# Patient Record
Sex: Male | Born: 1965
Health system: Southern US, Community
[De-identification: ages and names within clinical notes are randomized; demographics above are authoritative.]

## PROBLEM LIST (undated history)

## (undated) DIAGNOSIS — Z8489 Family history of other specified conditions: Secondary | ICD-10-CM

## (undated) DIAGNOSIS — I10 Essential (primary) hypertension: Secondary | ICD-10-CM

## (undated) DIAGNOSIS — G4730 Sleep apnea, unspecified: Secondary | ICD-10-CM

## (undated) DIAGNOSIS — R519 Headache, unspecified: Secondary | ICD-10-CM

## (undated) DIAGNOSIS — Z201 Contact with and (suspected) exposure to tuberculosis: Secondary | ICD-10-CM

## (undated) DIAGNOSIS — E119 Type 2 diabetes mellitus without complications: Secondary | ICD-10-CM

## (undated) DIAGNOSIS — B20 Human immunodeficiency virus [HIV] disease: Secondary | ICD-10-CM

## (undated) DIAGNOSIS — R51 Headache: Secondary | ICD-10-CM

## (undated) DIAGNOSIS — A6000 Herpesviral infection of urogenital system, unspecified: Secondary | ICD-10-CM

## (undated) DIAGNOSIS — K219 Gastro-esophageal reflux disease without esophagitis: Secondary | ICD-10-CM

## (undated) DIAGNOSIS — I2510 Atherosclerotic heart disease of native coronary artery without angina pectoris: Secondary | ICD-10-CM

## (undated) DIAGNOSIS — J45909 Unspecified asthma, uncomplicated: Secondary | ICD-10-CM

## (undated) DIAGNOSIS — M1990 Unspecified osteoarthritis, unspecified site: Secondary | ICD-10-CM

## (undated) DIAGNOSIS — Z21 Asymptomatic human immunodeficiency virus [HIV] infection status: Secondary | ICD-10-CM

## (undated) DIAGNOSIS — E669 Obesity, unspecified: Secondary | ICD-10-CM

## (undated) DIAGNOSIS — N179 Acute kidney failure, unspecified: Secondary | ICD-10-CM

## (undated) HISTORY — DX: Asymptomatic human immunodeficiency virus (hiv) infection status: Z21

## (undated) HISTORY — DX: Herpesviral infection of urogenital system, unspecified: A60.00

## (undated) HISTORY — DX: Human immunodeficiency virus (HIV) disease: B20

## (undated) HISTORY — DX: Atherosclerotic heart disease of native coronary artery without angina pectoris: I25.10

## (undated) HISTORY — PX: TONSILLECTOMY: SUR1361

## (undated) HISTORY — PX: HERNIA REPAIR: SHX51

## (undated) HISTORY — PX: APPENDECTOMY: SHX54

## (undated) HISTORY — PX: COLONOSCOPY W/ BIOPSIES AND POLYPECTOMY: SHX1376

---

## 2007-05-21 HISTORY — PX: UVULECTOMY: SHX2631

## 2007-05-21 HISTORY — PX: PEG TUBE PLACEMENT: SUR1034

## 2007-07-19 LAB — CONVERTED CEMR LAB
CD4 Count: 1 microliters
HIV 1 RNA Quant: 515000 copies/mL

## 2007-10-23 LAB — CONVERTED CEMR LAB
CD4 Count: 1 microliters
CD4 Count: 3 microliters
CD4 T Helper %: 1 %
HIV 1 RNA Quant: 179347 copies/mL
HIV 1 RNA Quant: 515000 copies/mL

## 2008-10-06 LAB — CONVERTED CEMR LAB
CD4 Count: 288 microliters
CD4 T Helper %: 15 %
HIV 1 RNA Quant: <50 copies/mL

## 2009-01-09 ENCOUNTER — Emergency Department (HOSPITAL_COMMUNITY): Admission: EM | Admit: 2009-01-09 | Discharge: 2009-01-09 | Payer: Self-pay | Admitting: Emergency Medicine

## 2009-07-30 ENCOUNTER — Emergency Department (HOSPITAL_COMMUNITY): Admission: EM | Admit: 2009-07-30 | Discharge: 2009-07-30 | Payer: Self-pay | Admitting: Emergency Medicine

## 2009-10-12 DIAGNOSIS — B20 Human immunodeficiency virus [HIV] disease: Secondary | ICD-10-CM | POA: Insufficient documentation

## 2009-10-18 ENCOUNTER — Ambulatory Visit: Payer: Self-pay | Admitting: Internal Medicine

## 2009-10-18 LAB — CONVERTED CEMR LAB
ALT: 14 units/L (ref 0–53)
AST: 18 units/L (ref 0–37)
Albumin: 3.8 g/dL (ref 3.5–5.2)
Alkaline Phosphatase: 118 units/L — ABNORMAL HIGH (ref 39–117)
BUN: 13 mg/dL (ref 6–23)
Basophils Absolute: 0 10*3/uL (ref 0.0–0.1)
Basophils Relative: 0 % (ref 0–1)
Bilirubin Urine: NEGATIVE
CO2: 21 meq/L (ref 19–32)
Calcium: 9 mg/dL (ref 8.4–10.5)
Chlamydia, Swab/Urine, PCR: NEGATIVE
Chloride: 106 meq/L (ref 96–112)
Cholesterol: 179 mg/dL (ref 0–200)
Creatinine, Ser: 1.07 mg/dL (ref 0.40–1.50)
Eosinophils Absolute: 0.2 10*3/uL (ref 0.0–0.7)
Eosinophils Relative: 3 % (ref 0–5)
GC Probe Amp, Urine: NEGATIVE
Glucose, Bld: 92 mg/dL (ref 70–99)
HCT: 37.5 % — ABNORMAL LOW (ref 39.0–52.0)
HCV Ab: NEGATIVE
HDL: 38 mg/dL — ABNORMAL LOW (ref >39)
HIV 1 RNA Quant: 51 copies/mL — ABNORMAL HIGH (ref <48)
HIV-1 RNA Quant, Log: 1.71 — ABNORMAL HIGH (ref <1.68)
HIV-1 antibody: POSITIVE — AB
HIV-2 Ab: NEGATIVE
HIV: REACTIVE
Hemoglobin, Urine: NEGATIVE
Hemoglobin: 12.4 g/dL — ABNORMAL LOW (ref 13.0–17.0)
Hep A Total Ab: POSITIVE — AB
Hep B Core Total Ab: NEGATIVE
Hep B S Ab: POSITIVE — AB
Hepatitis B Surface Ag: NEGATIVE
Ketones, ur: NEGATIVE mg/dL
LDL Cholesterol: 120 mg/dL — ABNORMAL HIGH (ref 0–99)
Leukocytes, UA: NEGATIVE
Lymphocytes Relative: 44 % (ref 12–46)
Lymphs Abs: 3.4 10*3/uL (ref 0.7–4.0)
MCHC: 33.1 g/dL (ref 30.0–36.0)
MCV: 93.1 fL (ref 78.0–100.0)
Monocytes Absolute: 0.5 10*3/uL (ref 0.1–1.0)
Monocytes Relative: 7 % (ref 3–12)
Neutro Abs: 3.6 10*3/uL (ref 1.7–7.7)
Neutrophils Relative %: 46 % (ref 43–77)
Nitrite: NEGATIVE
Platelets: 279 10*3/uL (ref 150–400)
Potassium: 4.2 meq/L (ref 3.5–5.3)
Protein, ur: NEGATIVE mg/dL
RBC: 4.03 M/uL — ABNORMAL LOW (ref 4.22–5.81)
RDW: 13.6 % (ref 11.5–15.5)
Sodium: 139 meq/L (ref 135–145)
Specific Gravity, Urine: 1.022 (ref 1.005–1.030)
Total Bilirubin: 0.6 mg/dL (ref 0.3–1.2)
Total CHOL/HDL Ratio: 4.7
Total Protein: 7.3 g/dL (ref 6.0–8.3)
Triglycerides: 104 mg/dL (ref <150)
Urine Glucose: NEGATIVE mg/dL
Urobilinogen, UA: 0.2 (ref 0.0–1.0)
VLDL: 21 mg/dL (ref 0–40)
WBC: 7.7 10*3/uL (ref 4.0–10.5)
pH: 6 (ref 5.0–8.0)

## 2009-11-01 ENCOUNTER — Ambulatory Visit: Payer: Self-pay | Admitting: Internal Medicine

## 2009-11-01 DIAGNOSIS — B009 Herpesviral infection, unspecified: Secondary | ICD-10-CM | POA: Insufficient documentation

## 2009-11-01 DIAGNOSIS — B029 Zoster without complications: Secondary | ICD-10-CM | POA: Insufficient documentation

## 2009-11-01 DIAGNOSIS — Z9089 Acquired absence of other organs: Secondary | ICD-10-CM | POA: Insufficient documentation

## 2009-11-01 DIAGNOSIS — B370 Candidal stomatitis: Secondary | ICD-10-CM | POA: Insufficient documentation

## 2009-11-02 DIAGNOSIS — G609 Hereditary and idiopathic neuropathy, unspecified: Secondary | ICD-10-CM | POA: Insufficient documentation

## 2009-11-02 DIAGNOSIS — B59 Pneumocystosis: Secondary | ICD-10-CM | POA: Insufficient documentation

## 2010-01-10 ENCOUNTER — Telehealth: Payer: Self-pay | Admitting: Internal Medicine

## 2010-02-08 ENCOUNTER — Encounter (INDEPENDENT_AMBULATORY_CARE_PROVIDER_SITE_OTHER): Payer: Self-pay | Admitting: *Deleted

## 2010-06-19 NOTE — Miscellaneous (Signed)
Summary: New 042 orders update  Clinical Lists Changes  Problems: Added new problem of HIV INFECTION (ICD-042) Orders: Added new Test order of T-Comprehensive Metabolic Panel [phone]) - Signed Added new Test order of T-CBC w/Diff [phone]) - Signed Added new Test order of T-CD4SP [facility]) (CD4SP) - Signed Added new Test order of T-HIV Viral Load [phone]) - Signed Added new Test order of T-Chlamydia  Probe, urine [phone]) - Signed Added new Test order of T-GC Probe, urine [phone]) - Signed Added new Test order of T-Hepatitis B Surface Antigen [phone]) - Signed Added new Test order of T-Hepatitis B Surface Antibody [phone]) - Signed Added new Test order of T-Hepatitis B Core Antibody ([id number]) - Signed Added new Test order of T-Hepatitis C Antibody ([id number]) - Signed Added new Test order of T-HIV Antibody  (Reflex) ([id number]) - Signed Added new Test order of T-HIV Ab Confirmatory Test/Western Blot ([id number]) - Signed Added new Test order of T-Lipid Profile ([id number]) - Signed Added new Test order of T-RPR (Syphilis) ([id number]) - Signed Added new Test order of T-Urinalysis ([id number]) - Signed Added new Test order of T-HIV1 Quant rflx Ultra or Genotype ([id number]) - Signed Added new Test order of T-Hepatitis A Antibody ([id number]) - Signed Allergies: Added new allergy or adverse reaction of PCN Observations: Added new observation of PCP_PMH_YEAR: 2003  (10/12/2009 15:12) Added new observation of NKA: F  (10/12/2009 15:12) Added new observation of HIV INIT DX: 1995  (10/12/2009 15:12) Added new observation of GENDER: Male  (10/12/2009 15:12) Added new observation of RACE: African American  (10/12/2009 15:12) Added new observation of HEPBVAX#3: Historical  (10/06/2008 15:25) Added new observation of PNEUMOVAX: Historical  (10/06/2008 15:24) Added new observation of TD BOOSTER: Historical  (10/06/2008 15:24) Added new  observation of T-HELPER %: 15 % (10/06/2008 15:20) Added new observation of HIV1RNA QA: <50 copies/mL (10/06/2008 15:19) Added new observation of CD4 COUNT: 288 microliters (10/06/2008 15:19) Added new observation of HEPBVAX#2: Historical  (08/31/2008 15:25) Added new observation of HEPAVAX #1: Historical  (08/31/2008 15:24) Added new observation of HEPBVAX#1: Historical  (08/31/2008 15:24) Added new observation of FLU VAX: Historical  (03/10/2008 15:24) Added new observation of T-HELPER %: 1 % (10/23/2007 15:21) Added new observation of HIV1RNA QA: 195093 copies/mL (10/23/2007 15:18) Added new observation of CD4 COUNT: 3 microliters (10/23/2007 15:17) Added new observation of HIV1RNA QA: 515000 copies/mL (10/23/2007 15:17) Added new observation of CD4 COUNT: 1 microliters (10/23/2007 15:17) Added new observation of HIV1RNA QA: 515000 copies/mL (07/19/2007 15:18) Added new observation of CD4 COUNT: 1 microliters (07/19/2007 15:18)         -  Date:  10/06/2008    CD4: 288    Viral Load: <50    CD4%: 15  Date:  10/23/2007    CD4: 3    CD4: 1    Viral Load: [id number]    Viral Load: 515000    CD4%: 1  Date:  07/19/2007    CD4: 1    Viral Load: 515000    Immunization History:  Hepatitis B Immunization History:    Hepatitis B # 1:  historical (08/31/2008)    Hepatitis B # 2:  historical (08/31/2008)    Hepatitis B # 3:  historical (10/06/2008)  Tetanus/Td Immunization History:    Tetanus/Td:  historical (10/06/2008)  Influenza Immunization History:    Influenza:  historical (03/10/2008)  Pneumovax Immunization History:    Pneumovax:  historical (10/06/2008)  Hepatitis A Immunization History:    Hepatitis A # 1:  historical (08/31/2008)  Medical History  HIV Intake Information When did you first test positive for HIV? 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

## 2010-06-19 NOTE — Assessment & Plan Note (Signed)
Summary: new 042 pt labs 6/1   CC:  new patient to establish and lab results.  History of Present Illness: This is the first ID clinic visit for [facility]  He moved here from [place] 11/10 to be with his partner. He is part of a [employer] study which is how he gets his medications.  He was diagnosed HIV in 1995.  Intial CD4ct <10.  Has K103N, M184V, L74I, K219Q and D67N mutations on previous genotypes.  he is currently on Reyetaz,norvir, Truvada and Isentress.  Current partner is HIV (+) and they recently had a baby.  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  No new complaints.  Preventive Screening-Counseling & Management  Alcohol-Tobacco     Alcohol drinks/day: occasional     Alcohol type: beer and wine     Smoking Status: quit > 6 months     Packs/Day: <0.25     Year Started: 1975  Caffeine-Diet-Exercise     Caffeine use/day: soda 2 per day     Does Patient Exercise: yes     Type of exercise: walking     Exercise (avg: min/session): 30-60     Times/week: 4  Safety-Violence-Falls     Seat Belt Use: yes      Sexual History:  currently monogamous.        Drug Use:  former and marijuana.    Comments: pt. declined condoms   Updated Prior Medication List: REYATAZ 300 MG CAPS (ATAZANAVIR SULFATE) Take 1 tablet by mouth once a day NORVIR 100 MG TABS (RITONAVIR) Take 1 tablet by mouth once a day TRUVADA 200-300 MG TABS (EMTRICITABINE-TENOFOVIR) Take 1 tablet by mouth once a day ISENTRESS 400 MG TABS (RALTEGRAVIR POTASSIUM) Take 1 tablet by mouth two times a day VALTREX 500 MG TABS (VALACYCLOVIR HCL) Take 1 tablet by mouth once a day  Current Allergies (reviewed today): ! PCN Social History: Sexual History:  currently monogamous Drug Use:  former, marijuana  Review of Systems  The patient denies anorexia, fever, and weight loss.    Vital Signs:  Patient profile:   45 year old male Height:      73 inches (185.42 cm) Weight:      344.4 pounds (156.55 kg) BMI:     45.60 Temp:     98.7 degrees F  (37.06 degrees C) oral Pulse rate:   87 / minute BP sitting:   131 / 85  (right arm)  Vitals Entered By: Wendall Mola CMA Duncan Dull) (November 01, 2009 2:21 PM) CC: new patient to establish, lab results Is Patient Diabetic? No Pain Assessment Patient in pain? no      Nutritional Status BMI of > 30 = obese Nutritional Status Detail appetite "fine"  Have you ever been in a relationship where you felt threatened, hurt or afraid?No   Does patient need assistance? Functional Status Self care Ambulation Normal Comments pt. missed about three doses of meds in the last month   Physical Exam  General:  alert, well-developed, well-nourished, and well-hydrated.   Head:  normocephalic and atraumatic.   Mouth:  pharynx pink and moist.   Lungs:  normal breath sounds.   Heart:  normal rate and regular rhythm.          Medication Adherence: 11/01/2009   Adherence to medications reviewed with patient. Counseling to provide adequate adherence provided   Prevention For Positives: 11/01/2009   Safe sex practices discussed with patient. Condoms offered.  Impression & Recommendations:  Problem # 1:  HIV INFECTION (ICD-042) Pt.s most recent CD4ct was 591 and VL 51 .  Pt instructed to continue the current antiretroviral regimen.  Pt encouraged to take medication regularly and not miss doses.  Pt will f/u in 3 months for repeat blood work and will see me 2 weeks later.  Diagnostics Reviewed:  HIV: REACTIVE (10/18/2009)   HIV-Western blot: Positive (10/18/2009)   CD4: 590 (10/19/2009)   WBC: 7.7 (10/18/2009)   Hgb: 12.4 (10/18/2009)   HCT: 37.5 (10/18/2009)   Platelets: 279 (10/18/2009) HIV-1 RNA: 51 (10/18/2009)   HBSAg: NEG (10/18/2009)  Orders: New Patient Level III (99203)Future Orders: T-CD4SP ([facility]) (CD4SP) ... 01/30/2010 T-HIV Viral Load [phone]) ... 01/30/2010 T-Comprehensive Metabolic Panel [id number]) ... 01/30/2010 T-CBC w/Diff  ([id number]) ... 01/30/2010  Medications Added to Medication List This Visit: 1)  Reyataz 300 Mg Caps (Atazanavir sulfate) .... Take 1 tablet by mouth once a day 2)  Norvir 100 Mg Tabs (Ritonavir) .... Take 1 tablet by mouth once a day 3)  Truvada 200-300 Mg Tabs (Emtricitabine-tenofovir) .... Take 1 tablet by mouth once a day 4)  Isentress 400 Mg Tabs (Raltegravir potassium) .... Take 1 tablet by mouth two times a day 5)  Valtrex 500 Mg Tabs (Valacyclovir hcl) .... Take 1 tablet by mouth once a day  Patient Instructions: 1)  Please schedule a follow-up appointment in 3 months, 2 weeks after labs.

## 2010-06-19 NOTE — Progress Notes (Signed)
Summary: Refill Valtrex  Phone Note Call from Patient   Caller: Patient Summary of Call: Pt. requesting a refill on Valtrex called to [facility] at [address]. Initial call taken by: Wendall Mola CMA Duncan Dull),  January 10, 2010 8:42 AM

## 2010-06-19 NOTE — Consult Note (Signed)
Summary: New Pt. : Vincent Young. Health Dept.  New Pt. : Vincent Young. Health Dept.   Imported By: Florinda Marker 11/06/2009 16:31:47  _____________________________________________________________________  External Attachment:    Type:   Image     Comment:   External Document

## 2010-06-19 NOTE — Miscellaneous (Signed)
Summary: RW update  Clinical Lists Changes  Observations: Added new observation of RWPARTICIP: Yes (02/08/2010 9:26)

## 2010-06-19 NOTE — Miscellaneous (Signed)
Summary: HIPAA Restrictions  HIPAA Restrictions   Imported By: Florinda Marker 10/19/2009 15:11:18  _____________________________________________________________________  External Attachment:    Type:   Image     Comment:   External Document

## 2010-06-28 ENCOUNTER — Encounter (INDEPENDENT_AMBULATORY_CARE_PROVIDER_SITE_OTHER): Payer: Self-pay | Admitting: *Deleted

## 2010-07-02 ENCOUNTER — Encounter (INDEPENDENT_AMBULATORY_CARE_PROVIDER_SITE_OTHER): Payer: Self-pay | Admitting: *Deleted

## 2010-07-05 NOTE — Miscellaneous (Signed)
  Clinical Lists Changes  Observations: Added new observation of PAYOR: No Insurance (06/28/2010 14:02) Added new observation of LATINO/HISP: No (06/28/2010 14:02)

## 2010-07-10 ENCOUNTER — Encounter (INDEPENDENT_AMBULATORY_CARE_PROVIDER_SITE_OTHER): Payer: Self-pay | Admitting: *Deleted

## 2010-07-11 ENCOUNTER — Encounter (INDEPENDENT_AMBULATORY_CARE_PROVIDER_SITE_OTHER): Payer: Self-pay | Admitting: *Deleted

## 2010-07-11 NOTE — Miscellaneous (Signed)
  Clinical Lists Changes  Observations: Added new observation of [place]: [place] (07/02/2010 16:17)

## 2010-07-12 ENCOUNTER — Encounter (INDEPENDENT_AMBULATORY_CARE_PROVIDER_SITE_OTHER): Payer: Self-pay | Admitting: *Deleted

## 2010-07-17 NOTE — Miscellaneous (Signed)
  Clinical Lists Changes  Observations: Added new observation of HOUSING: stable/permanent (05/08/2010 10:13)

## 2010-07-17 NOTE — Miscellaneous (Signed)
  Clinical Lists Changes  Observations: Added new observation of YEARAIDSPOS: 1997  (07/10/2010 11:57)

## 2010-07-17 NOTE — Miscellaneous (Signed)
  Clinical Lists Changes  Observations: Added new observation of HOUSING: Stable/permanent (07/11/2010 17:22)

## 2010-07-17 NOTE — Miscellaneous (Signed)
  Clinical Lists Changes  Observations: Added new observation of HIV RISK BEH: MSM (07/11/2010 16:54)

## 2010-08-06 ENCOUNTER — Telehealth: Payer: Self-pay | Admitting: *Deleted

## 2010-08-06 LAB — T-HELPER CELL (CD4) - (RCID CLINIC ONLY)
CD4 % Helper T Cell: 19 % — ABNORMAL LOW (ref 33–55)
CD4 T Cell Abs: 590 uL (ref 400–2700)

## 2010-08-16 NOTE — Progress Notes (Signed)
  Phone Note Call from Patient   Summary of Call: he had left message staing he thinks he is due for an appt. I called him back & LM giving the front desk #. asked that he call them & make an appt Initial call taken by: Golden Circle RN,  August 06, 2010 3:29 PM

## 2010-09-12 ENCOUNTER — Other Ambulatory Visit: Payer: Self-pay | Admitting: *Deleted

## 2010-09-12 DIAGNOSIS — Z0000 Encounter for general adult medical examination without abnormal findings: Secondary | ICD-10-CM

## 2010-09-12 DIAGNOSIS — B20 Human immunodeficiency virus [HIV] disease: Secondary | ICD-10-CM

## 2010-09-13 ENCOUNTER — Other Ambulatory Visit (INDEPENDENT_AMBULATORY_CARE_PROVIDER_SITE_OTHER): Payer: BLUE CROSS/BLUE SHIELD

## 2010-09-13 ENCOUNTER — Other Ambulatory Visit: Payer: Self-pay | Admitting: Infectious Diseases

## 2010-09-13 DIAGNOSIS — B20 Human immunodeficiency virus [HIV] disease: Secondary | ICD-10-CM

## 2010-09-13 DIAGNOSIS — Z0000 Encounter for general adult medical examination without abnormal findings: Secondary | ICD-10-CM

## 2010-09-14 LAB — CBC
HCT: 40.5 % (ref 39.0–52.0)
Hemoglobin: 13.4 g/dL (ref 13.0–17.0)
MCH: 31.3 pg (ref 26.0–34.0)
MCHC: 33.1 g/dL (ref 30.0–36.0)
MCV: 94.6 fL (ref 78.0–100.0)
Platelets: 286 10*3/uL (ref 150–400)
RBC: 4.28 MIL/uL (ref 4.22–5.81)
RDW: 13.9 % (ref 11.5–15.5)
WBC: 7.9 10*3/uL (ref 4.0–10.5)

## 2010-09-14 LAB — COMPREHENSIVE METABOLIC PANEL
ALT: 14 U/L (ref 0–53)
AST: 14 U/L (ref 0–37)
Albumin: 4 g/dL (ref 3.5–5.2)
Alkaline Phosphatase: 123 U/L — ABNORMAL HIGH (ref 39–117)
BUN: 13 mg/dL (ref 6–23)
CO2: 22 mEq/L (ref 19–32)
Calcium: 8.8 mg/dL (ref 8.4–10.5)
Chloride: 106 mEq/L (ref 96–112)
Creat: 0.91 mg/dL (ref 0.40–1.50)
Glucose, Bld: 111 mg/dL — ABNORMAL HIGH (ref 70–99)
Potassium: 4.2 mEq/L (ref 3.5–5.3)
Sodium: 140 mEq/L (ref 135–145)
Total Bilirubin: 2.1 mg/dL — ABNORMAL HIGH (ref 0.3–1.2)
Total Protein: 7.1 g/dL (ref 6.0–8.3)

## 2010-09-14 LAB — DIFFERENTIAL
Basophils Absolute: 0 10*3/uL (ref 0.0–0.1)
Basophils Relative: 0 % (ref 0–1)
Eosinophils Absolute: 0.2 10*3/uL (ref 0.0–0.7)
Eosinophils Relative: 2 % (ref 0–5)
Lymphocytes Relative: 38 % (ref 12–46)
Lymphs Abs: 2.8 10*3/uL (ref 0.7–4.0)
Monocytes Absolute: 0.5 10*3/uL (ref 0.1–1.0)
Monocytes Relative: 7 % (ref 3–12)
Neutro Abs: 3.9 10*3/uL (ref 1.7–7.7)
Neutrophils Relative %: 53 % (ref 43–77)

## 2010-09-14 LAB — T-HELPER CELL (CD4) - (RCID CLINIC ONLY)
CD4 % Helper T Cell: 19 % — ABNORMAL LOW (ref 33–55)
CD4 T Cell Abs: 570 uL (ref 400–2700)

## 2010-09-15 LAB — HIV-1 RNA QUANT-NO REFLEX-BLD
HIV 1 RNA Quant: <20 copies/mL (ref <20)
HIV-1 RNA Quant, Log: <1.3 {Log} (ref <1.30)

## 2010-09-27 ENCOUNTER — Encounter: Payer: Self-pay | Admitting: Adult Health

## 2010-09-27 ENCOUNTER — Ambulatory Visit (INDEPENDENT_AMBULATORY_CARE_PROVIDER_SITE_OTHER): Payer: BLUE CROSS/BLUE SHIELD | Admitting: Adult Health

## 2010-09-27 DIAGNOSIS — B009 Herpesviral infection, unspecified: Secondary | ICD-10-CM

## 2010-09-27 DIAGNOSIS — B20 Human immunodeficiency virus [HIV] disease: Secondary | ICD-10-CM

## 2010-09-27 DIAGNOSIS — Z79899 Other long term (current) drug therapy: Secondary | ICD-10-CM

## 2010-09-27 NOTE — Progress Notes (Signed)
Subjective:    Patient ID: Vincent Young, male    DOB: [birth year], 45 y.o.   MRN: [id number]  HPI not seen in clinic since June of 2011. Claims he has been adherent to his current regimen, which includes Reyataz, Norvir Truvada, Isentress. Claims good tolerance to this regimen, with only one missed dose in the past 6 months. He voices no physical complaints except having gained significant amount of weight over the past year. He does relate recent recurrent outbreaks of genital HSV for which previously. He has been on Valtrex suppressive therapy, but he ran out of the medication and did not have any refills for 3 weeks. Outbreak subsided after he resumed his Valtrex suppressive therapy. Denies any active lesions at present. He is currently on a weight reduction. Plan and has lost 5-10 pounds.    Review of Systems  Constitutional: Negative for fever, chills, diaphoresis, activity change, appetite change, fatigue and unexpected weight change.  HENT: Negative for hearing loss, ear pain, nosebleeds, congestion, sore throat, facial swelling, rhinorrhea, sneezing, drooling, mouth sores, trouble swallowing, neck pain, neck stiffness, dental problem, voice change, postnasal drip, sinus pressure, tinnitus and ear discharge.   Eyes: Negative for photophobia, pain, discharge, redness, itching and visual disturbance.  Respiratory: Negative for apnea, cough, choking, chest tightness, shortness of breath, wheezing and stridor.   Cardiovascular: Negative for chest pain, palpitations and leg swelling.  Gastrointestinal: Negative for nausea, vomiting, abdominal pain, diarrhea, constipation, blood in stool, abdominal distention, anal bleeding and rectal pain.  Genitourinary: Negative for dysuria, urgency, frequency, hematuria, flank pain, decreased urine volume, discharge, penile swelling, scrotal swelling, enuresis, difficulty urinating, genital sores, penile pain and testicular pain.  Musculoskeletal: Negative for  myalgias, back pain, joint swelling, arthralgias and gait problem.  Skin: Negative for color change, pallor, rash and wound.  Neurological: Negative for dizziness, tremors, seizures, syncope, facial asymmetry, speech difficulty, weakness, light-headedness, numbness and headaches.  Hematological: Negative for adenopathy. Does not bruise/bleed easily.  Psychiatric/Behavioral: Negative for suicidal ideas, hallucinations, behavioral problems, confusion, sleep disturbance, self-injury, dysphoric mood, decreased concentration and agitation. The patient is not nervous/anxious and is not hyperactive.        Objective:   Physical Exam  Constitutional: He is oriented to person, place, and time. He appears well-developed. No distress.       Morbidly obese  HENT:  Head: Normocephalic and atraumatic.  Right Ear: External ear normal.  Left Ear: External ear normal.  Nose: Nose normal.  Mouth/Throat: Oropharynx is clear and moist. No oropharyngeal exudate.  Eyes: Conjunctivae and EOM are normal. Pupils are equal, round, and reactive to light. Right eye exhibits no discharge. Left eye exhibits no discharge. No scleral icterus.  Neck: Normal range of motion. Neck supple. No JVD present. No tracheal deviation present. No thyromegaly present.  Cardiovascular: Normal rate, regular rhythm and intact distal pulses.  Exam reveals no gallop and no friction rub.   No murmur heard. Pulmonary/Chest: Effort normal and breath sounds normal. No stridor. No respiratory distress. He has no wheezes. He has no rales. He exhibits no tenderness.  Abdominal: Soft. Bowel sounds are normal. He exhibits no distension and no mass. There is no tenderness. There is no rebound and no guarding.  Genitourinary: Rectum normal and penis normal.  Musculoskeletal: Normal range of motion. He exhibits no edema and no tenderness.  Lymphadenopathy:    He has no cervical adenopathy.  Neurological: He is alert and oriented to person, place,  and time. No cranial nerve deficit. He exhibits  normal muscle tone. Coordination normal.  Skin: Skin is warm and dry. No rash noted. No erythema. No pallor.  Psychiatric: He has a normal mood and affect. His behavior is normal. Judgment and thought content normal.          Assessment & Plan:  1. HIV. From labs obtained on 09/13/2010 CD4 count was 570 at 19% with a viral load of less than 20 copies per mL. This is consistent with his labs that were obtained in June 2011, showing no changes in either percentage or absolute CD4 count. He remains clinically stable on his current regimen. Recommend continuing present management with a followup 4 months. Repeat fasting labs 2 weeks before his next appointment. He verbally acknowledged this and agreed with plan of care.  2. Recurrent HSV. Recommend continuing his suppressive therapy, although no findings of active lesions on this examination. States he will continue this and inform us if there is any breakthrough lesions.  3. Morbid Obesity. States he's currently on a weight reduction diet continue to monitor this. If there appears to be any lack of progress, we will discuss on his followup.

## 2011-02-04 ENCOUNTER — Other Ambulatory Visit: Payer: Self-pay | Admitting: *Deleted

## 2011-02-04 DIAGNOSIS — B009 Herpesviral infection, unspecified: Secondary | ICD-10-CM

## 2011-02-04 DIAGNOSIS — B20 Human immunodeficiency virus [HIV] disease: Secondary | ICD-10-CM

## 2011-02-04 MED ORDER — ATAZANAVIR SULFATE 300 MG PO CAPS: 300.0000 mg | ORAL_CAPSULE | Freq: Every day | ORAL | Status: DC

## 2011-02-04 MED ORDER — VALACYCLOVIR HCL 500 MG PO TABS: 500.0000 mg | ORAL_TABLET | Freq: Every day | ORAL | Status: DC

## 2011-02-04 MED ORDER — RITONAVIR 100 MG PO CAPS: 100.0000 mg | ORAL_CAPSULE | Freq: Every day | ORAL | Status: DC

## 2011-02-04 MED ORDER — EMTRICITABINE-TENOFOVIR DF 200-300 MG PO TABS: 1.0000 | ORAL_TABLET | Freq: Every day | ORAL | Status: DC

## 2011-02-04 MED ORDER — RALTEGRAVIR POTASSIUM 400 MG PO TABS: 400.0000 mg | ORAL_TABLET | Freq: Two times a day (BID) | ORAL | Status: DC

## 2011-05-20 ENCOUNTER — Telehealth: Payer: Self-pay | Admitting: *Deleted

## 2011-05-20 NOTE — Telephone Encounter (Signed)
Called patient to schedule a follow up visit.  His insurance does not cover his labs, but he is in a study that does his lab work.  He will have the results faxed to this clinic.  Appointment scheduled with Dr. Luciana Axe for 06/06/11. Wendall Mola CMA

## 2011-06-06 ENCOUNTER — Encounter: Payer: Self-pay | Admitting: Internal Medicine

## 2011-06-06 ENCOUNTER — Ambulatory Visit (INDEPENDENT_AMBULATORY_CARE_PROVIDER_SITE_OTHER): Payer: BLUE CROSS/BLUE SHIELD | Admitting: Internal Medicine

## 2011-06-06 DIAGNOSIS — B20 Human immunodeficiency virus [HIV] disease: Secondary | ICD-10-CM

## 2011-06-06 DIAGNOSIS — B009 Herpesviral infection, unspecified: Secondary | ICD-10-CM

## 2011-06-06 NOTE — Assessment & Plan Note (Signed)
He did lose weight but unfortunately did not hold during the holidays.  I did encourage starting his exercise regimen again.  He does appear motivated to restart and benefits of that were discussed.

## 2011-06-06 NOTE — Patient Instructions (Signed)
Keep up the exercise.  

## 2011-06-06 NOTE — Assessment & Plan Note (Addendum)
He is doing well on his current regimen.  I did emphasize the need for complete compliance with no missed doses and encouraged him to improve on that.  I encouraged condom use with all sexual activity.  I also discussed the long term benefits of treatment and that other comorbidities become more significant if the HIV remains stable.  F/U in 6 months

## 2011-06-06 NOTE — Progress Notes (Signed)
  Subjective:    Patient ID: Vincent Young, male    DOB: 1965-06-25, 46 y.o.   MRN: [id number]  HPI he comes in for routine follow up of his HIV.  He continues on a regimen of Reyataz, norvir, Isentress, and Truvada and reports good adherence, though does endorse occasional missed doses.  I do not see a history of a resistance mutation, but in discussion with him, he likely does, accounting for the regimen.  He has been working on weight loss and in fact had been losing weight but during the holiday season, gained it all back.  He denies any other issues.  He does continue suppressive Valtrex and has had some mild reoccurences but nothing significant.      Review of Systems  Constitutional: Negative for fever, fatigue and unexpected weight change.  HENT: Negative for sore throat and trouble swallowing.   Respiratory: Negative for cough and shortness of breath.   Cardiovascular: Negative for chest pain and leg swelling.  Gastrointestinal: Negative for nausea, abdominal pain and diarrhea.  Genitourinary: Negative for genital sores and penile pain.  Musculoskeletal: Negative for myalgias and arthralgias.  Skin: Negative for rash.  Neurological: Negative for light-headedness and headaches.  Hematological: Negative for adenopathy.  Psychiatric/Behavioral: Negative for dysphoric mood. The patient is not nervous/anxious.        Objective:   Physical Exam  Constitutional: He is oriented to person, place, and time. He appears well-developed and well-nourished. No distress.  HENT:  Mouth/Throat: Oropharynx is clear and moist. No oropharyngeal exudate.  Cardiovascular: Normal rate, regular rhythm and normal heart sounds.  Exam reveals no gallop and no friction rub.   No murmur heard. Pulmonary/Chest: Effort normal and breath sounds normal. No respiratory distress. He has no wheezes.  Abdominal: Soft. Bowel sounds are normal. He exhibits no distension. There is no tenderness.       Morbidly obese   Lymphadenopathy:    He has no cervical adenopathy.  Neurological: He is alert and oriented to person, place, and time.  Skin: Skin is warm and dry. No rash noted. No erythema.  Psychiatric: He has a normal mood and affect. His behavior is normal.          Assessment & Plan:

## 2011-06-06 NOTE — Assessment & Plan Note (Signed)
He will continue with suppressiv etherapy.  No new issues.

## 2011-06-17 ENCOUNTER — Other Ambulatory Visit: Payer: Self-pay | Admitting: *Deleted

## 2011-06-17 DIAGNOSIS — B20 Human immunodeficiency virus [HIV] disease: Secondary | ICD-10-CM

## 2011-06-17 MED ORDER — RALTEGRAVIR POTASSIUM 400 MG PO TABS: 400.0000 mg | ORAL_TABLET | Freq: Two times a day (BID) | ORAL | Status: DC

## 2011-06-17 MED ORDER — EMTRICITABINE-TENOFOVIR DF 200-300 MG PO TABS: 1.0000 | ORAL_TABLET | Freq: Every day | ORAL | Status: DC

## 2011-06-17 MED ORDER — RITONAVIR 100 MG PO TABS: 100.0000 mg | ORAL_TABLET | Freq: Every day | ORAL | Status: DC

## 2011-06-17 MED ORDER — ATAZANAVIR SULFATE 300 MG PO CAPS: 300.0000 mg | ORAL_CAPSULE | Freq: Every day | ORAL | Status: DC

## 2011-06-18 ENCOUNTER — Ambulatory Visit (INDEPENDENT_AMBULATORY_CARE_PROVIDER_SITE_OTHER): Payer: BLUE CROSS/BLUE SHIELD | Admitting: Surgery

## 2011-06-18 ENCOUNTER — Encounter (INDEPENDENT_AMBULATORY_CARE_PROVIDER_SITE_OTHER): Payer: Self-pay | Admitting: Surgery

## 2011-06-18 ENCOUNTER — Other Ambulatory Visit (INDEPENDENT_AMBULATORY_CARE_PROVIDER_SITE_OTHER): Payer: Self-pay | Admitting: Surgery

## 2011-06-18 VITALS — BP 130/94 | HR 106 | Temp 99.0°F | Resp 20 | Ht 72.75 in | Wt 346.4 lb

## 2011-06-18 DIAGNOSIS — L0390 Cellulitis, unspecified: Secondary | ICD-10-CM

## 2011-06-18 DIAGNOSIS — L0291 Cutaneous abscess, unspecified: Secondary | ICD-10-CM

## 2011-06-18 NOTE — Patient Instructions (Signed)
Change dry gauze dressing externally as needed.  Remove packing from wounds on Friday morning.  Take all of antibiotics.  tmg

## 2011-06-18 NOTE — Progress Notes (Signed)
Chief Complaint  Patient presents with  . Cyst    pilo cyst- draining... pt given doxy but hasnt started it    HISTORY: Patient is a 46 year old black male with known history of HIV. He is on treatment. He has developed multiple cutaneous abscesses he is involving the buttocks and extremities. He was seen yesterday by his primary physician and started on doxycycline. He was referred to our office for incision and drainage of large abscess he is on the buttocks.  Past Medical History  Diagnosis Date  . HIV infection   . Recurrent genital HSV (herpes simplex virus) infection   . Pilonidal cyst      Current Outpatient Prescriptions  Medication Sig Dispense Refill  . atazanavir (REYATAZ) 300 MG capsule Take 1 capsule (300 mg total) by mouth daily with breakfast.  30 capsule  3  . emtricitabine-tenofovir (TRUVADA) 200-300 MG per tablet Take 1 tablet by mouth daily.  30 tablet  3  . HYDROcodone-acetaminophen (NORCO) 5-325 MG per tablet Take 1 tablet by mouth every 6 (six) hours as needed.      . raltegravir (ISENTRESS) 400 MG tablet Take 1 tablet (400 mg total) by mouth 2 (two) times daily.  60 tablet  3  . ritonavir (NORVIR) 100 MG TABS Take 1 tablet (100 mg total) by mouth daily.  30 tablet  3  . valACYclovir (VALTREX) 500 MG tablet Take 1 tablet (500 mg total) by mouth daily.  30 tablet  3  . doxycycline (PERIOSTAT) 20 MG tablet Take 20 mg by mouth 2 (two) times daily.         Allergies  Allergen Reactions  . Penicillins     REACTION: rash     Family History  Problem Relation Age of Onset  . Heart disease Father      History   Social History  . Marital Status: Single    Spouse Name: N/A    Number of Children: N/A  . Years of Education: N/A   Social History Main Topics  . Smoking status: Former Games developer  . Smokeless tobacco: Never Used  . Alcohol Use: Yes  . Drug Use: No  . Sexually Active: Yes -- Male partner(s)   Other Topics Concern  . None   Social History  Narrative  . None     REVIEW OF SYSTEMS - PERTINENT POSITIVES ONLY: HIV positive. History of cutaneous abscess.  EXAM: Filed Vitals:   06/18/11 1655  BP: 130/94  Pulse: 106  Temp: 99 F (37.2 C)  Resp: 20    HEENT: normocephalic; pupils equal and reactive; sclerae clear; dentition good; mucous membranes moist NECK:  symmetric on extension; no palpable anterior or posterior cervical lymphadenopathy; no supraclavicular masses; no tenderness CHEST: clear to auscultation bilaterally without rales, rhonchi, or wheezes CARDIAC: regular rate and rhythm without significant murmur; peripheral pulses are full ABDOMEN: soft without distension; bowel sounds present; no mass; no hepatosplenomegaly; no hernia GU:  In the natal cleft and on the bilateral buttocks with areas of induration with superficial ulceration. These are exquisitely tender. They are consistent with MRSA infection. The largest are just to the right of the natal cleft and in the upper left buttock. These require incision and drainage as noted in the procedure note below. EXT:  non-tender without edema; no deformity NEURO: no gross focal deficits; no sign of tremor   LABORATORY RESULTS: See [facility] ([facility]pic) for most recent results   RADIOLOGY RESULTS: See [facility] (CHL-Epic) for most recent results  PROCEDURE: The patient was placed in a prone position. Skin was prepped with Betadine. Both lesions are anesthetized with local anesthetic. Using a #15 blade both lesions are incised into the abscess cavities. Purulent fluid is evacuated. Cultures are taken and submitted to the laboratory. Both wounds were packed with quarter inch Xeroform gauze packing covered with dry gauze dressings and an ABDs pad. The patient tolerated the procedure well.  IMPRESSION: Abscesses involving the natal cleft and left buttock, likely MRSA infection  PLAN: Usual wound care instructions are given to the patient. I've asked him  to remove the packing in 3 days. He will return to the office at that time for a wound check. He has a prescription for doxycycline from his primary care office. He will continue to take that. I will give him a prescription for Vicodin to take as needed for pain. I have recommended that the patient follow up with his infectious disease specialist in the near future to review the results of the cultures and to initiate treatment for MRSA in a patient with HIV.  Velora Heckler, MD, FACS General & Endocrine Surgery [facility]   Visit Diagnoses: 1. Abscess, left buttock and natal cleft     Primary Care Physician: Berenda Morale, MD, MD  ID:  Dr. Staci Righter

## 2011-06-21 ENCOUNTER — Ambulatory Visit (INDEPENDENT_AMBULATORY_CARE_PROVIDER_SITE_OTHER): Payer: MEDICARE | Admitting: Surgery

## 2011-06-21 ENCOUNTER — Encounter (INDEPENDENT_AMBULATORY_CARE_PROVIDER_SITE_OTHER): Payer: Self-pay | Admitting: Surgery

## 2011-06-21 VITALS — BP 124/88 | HR 100 | Temp 97.0°F | Resp 18 | Ht 72.75 in | Wt 348.4 lb

## 2011-06-21 DIAGNOSIS — L0291 Cutaneous abscess, unspecified: Secondary | ICD-10-CM

## 2011-06-21 NOTE — Progress Notes (Signed)
S/p drainage of two abscesses of the buttocks on 06/18/11 by Dr. Gerrit Friends in the Urgent Office.  He return today for a wound check.  The packing was removed this morning.  He states that the areas feel much better.  No erythema, minimal swelling.  I probed both with a cotton swab to keep the skin open.  His wife will continue treating these areas with antibiotic ointment applied with a cotton swab each day.  Continue doxycyline.  He has another area on his right posterior thigh that is firm, but no erythema or tenderness.  He states that this actually seems to be getting smaller.  No fluctuance noted, so I would not I&D at this time.    Recheck next week by Dr. Gerrit Friends.  Wilmon Arms. Corliss Skains, MD, [facility]  06/21/2011 2:49 PM

## 2011-06-22 LAB — WOUND CULTURE
Gram Stain: NONE SEEN
Gram Stain: NONE SEEN
Gram Stain: NONE SEEN

## 2011-06-26 ENCOUNTER — Telehealth (INDEPENDENT_AMBULATORY_CARE_PROVIDER_SITE_OTHER): Payer: Self-pay

## 2011-06-26 NOTE — Telephone Encounter (Signed)
Complete current course of abx.  No refill needed. Return for follow up as needed. tmg

## 2011-06-26 NOTE — Telephone Encounter (Signed)
Patient aware of no refill of antibiotic.

## 2011-06-26 NOTE — Telephone Encounter (Signed)
Patient was called for a follow up visit today after seeing Dr. Corliss Skains on Friday 06/21/2011 for buttocks abscesses. He stated he was doing fine, no swelling, no pain. The areas have almost stopped draining and are closing up.  He does have a little bit of clear drainage on the gauze. " No room for packing." He would like to know if his Doxy can be refilled. He does not feel he need to return for an appointment at this time. However please address antibiotic refill. [facility] [address].

## 2011-07-15 ENCOUNTER — Other Ambulatory Visit: Payer: Self-pay | Admitting: *Deleted

## 2011-07-15 DIAGNOSIS — B009 Herpesviral infection, unspecified: Secondary | ICD-10-CM

## 2011-07-15 MED ORDER — VALACYCLOVIR HCL 500 MG PO TABS: 500.0000 mg | ORAL_TABLET | Freq: Every day | ORAL | Status: DC

## 2011-07-25 ENCOUNTER — Encounter (HOSPITAL_COMMUNITY): Payer: Self-pay | Admitting: Emergency Medicine

## 2011-07-25 ENCOUNTER — Emergency Department (HOSPITAL_COMMUNITY)
Admission: EM | Admit: 2011-07-25 | Discharge: 2011-07-26 | Disposition: A | Discharge status: Home Health | Payer: MEDICARE | Attending: Emergency Medicine | Admitting: Emergency Medicine

## 2011-07-25 ENCOUNTER — Emergency Department (HOSPITAL_COMMUNITY): Payer: MEDICARE

## 2011-07-25 DIAGNOSIS — Z21 Asymptomatic human immunodeficiency virus [HIV] infection status: Secondary | ICD-10-CM | POA: Insufficient documentation

## 2011-07-25 DIAGNOSIS — M549 Dorsalgia, unspecified: Secondary | ICD-10-CM

## 2011-07-25 DIAGNOSIS — R109 Unspecified abdominal pain: Secondary | ICD-10-CM

## 2011-07-25 DIAGNOSIS — Z79899 Other long term (current) drug therapy: Secondary | ICD-10-CM | POA: Insufficient documentation

## 2011-07-25 LAB — URINALYSIS, ROUTINE W REFLEX MICROSCOPIC
Bilirubin Urine: NEGATIVE
Glucose, UA: NEGATIVE mg/dL
Hgb urine dipstick: NEGATIVE
Ketones, ur: NEGATIVE mg/dL
Leukocytes, UA: NEGATIVE
Nitrite: NEGATIVE
Protein, ur: NEGATIVE mg/dL
Specific Gravity, Urine: 1.021 (ref 1.005–1.030)
Urobilinogen, UA: 0.2 mg/dL (ref 0.0–1.0)
pH: 5.5 (ref 5.0–8.0)

## 2011-07-25 LAB — POCT I-STAT, CHEM 8
BUN: 16 mg/dL (ref 6–23)
Calcium, Ion: 1.18 mmol/L (ref 1.12–1.32)
Chloride: 106 mEq/L (ref 96–112)
Creatinine, Ser: 1.1 mg/dL (ref 0.50–1.35)
Glucose, Bld: 108 mg/dL — ABNORMAL HIGH (ref 70–99)
HCT: 43 % (ref 39.0–52.0)
Hemoglobin: 14.6 g/dL (ref 13.0–17.0)
Potassium: 4.3 mEq/L (ref 3.5–5.1)
Sodium: 141 mEq/L (ref 135–145)
TCO2: 28 mmol/L (ref 0–100)

## 2011-07-25 MED ORDER — IBUPROFEN 400 MG PO TABS: 400.0000 mg | ORAL_TABLET | Freq: Three times a day (TID) | ORAL | Status: AC | PRN

## 2011-07-25 MED ORDER — HYDROCODONE-ACETAMINOPHEN 5-325 MG PO TABS: 1.0000 | ORAL_TABLET | ORAL | Status: AC | PRN

## 2011-07-25 MED ORDER — DIAZEPAM 5 MG PO TABS: 5.0000 mg | ORAL_TABLET | Freq: Three times a day (TID) | ORAL | Status: AC | PRN

## 2011-07-25 MED ORDER — OXYCODONE-ACETAMINOPHEN 5-325 MG PO TABS
2.0000 | ORAL_TABLET | Freq: Once | ORAL | Status: AC
  Administered 2011-07-25: 2 via ORAL
  Filled 2011-07-25: qty 2

## 2011-07-25 NOTE — Discharge Instructions (Signed)
Please read over the instructions below. Your urine test, chemistry panel and CT abdomen pelvis tonight were all normal. We are treating you for musculoskeletal low back pain. Take the ibuprofen 3 times a day for 3 days as directed in the muscle relaxers and hydrocodone/APAP for more severe pain as directed. If your pain does not improve over the next 3-5 days  arrange follow up with Dr. Larwance Sachs at [place] as discussed. Return for worsening symptoms.     Back Pain, Adult Back pain is very common. The pain often gets better over time. The cause of back pain is usually not dangerous. Most people can learn to manage their back pain on their own.  HOME CARE   Stay active. Start with short walks on flat ground if you can. Try to walk farther each day.   Do not sit, drive, or stand in one place for more than 30 minutes. Do not stay in bed.   Do not avoid exercise or work. Activity can help your back heal faster.   Be careful when you bend or lift an object. Bend at your knees, keep the object close to you, and do not twist.   Sleep on a firm mattress. Lie on your side, and bend your knees. If you lie on your back, put a pillow under your knees.   Only take medicines as told by your doctor.   Put ice on the injured area.   Put ice in a plastic bag.   Place a towel between your skin and the bag.   Leave the ice on for 15 to 20 minutes, 3 to 4 times a day for the first 2 to 3 days. After that, you can switch between ice and heat packs.   Ask your doctor about back exercises or massage.   Avoid feeling anxious or stressed. Find good ways to deal with stress, such as exercise.  GET HELP RIGHT AWAY IF:   Your pain does not go away with rest or medicine.   Your pain does not go away in 1 week.   You have new problems.   You do not feel well.   The pain spreads into your legs.   You cannot control when you poop (bowel movement) or pee (urinate).   Your arms or legs feel weak or lose  feeling (numbness).   You feel sick to your stomach (nauseous) or throw up (vomit).   You have belly (abdominal) pain.   You feel like you may pass out (faint).  MAKE SURE YOU:   Understand these instructions.   Will watch your condition.   Will get help right away if you are not doing well or get worse.  Document Released: 10/23/2007 Document Revised: 04/25/2011 Document Reviewed: 09/24/2010 [facility] Patient Information 2012 [place], [place].Flank Pain Flank pain is pain in your side.  HOME CARE  Home care and treatment will depend on the cause of your pain.   Some medicines can help relieve the pain. Take all medicine as told by your doctor.   Tell your doctor about any changes in your pain.   Follow up with your doctor.  GET HELP RIGHT AWAY IF:   Your pain does not get better with medicine.   The pain gets worse.   You have belly (abdominal) pain.   You are short of breath.   You always feel sick to your stomach (nauseous).   You keep throwing up (vomiting).   You have puffiness (swelling) in your  belly.   You pass out (faint).   You have a temperature by mouth above 102 F (38.9 C), not controlled by medicine.  MAKE SURE YOU:   Understand these instructions.   Will watch your condition.   Will get help right away if you are not doing well or get worse.  Document Released: 02/13/2008 Document Revised: 04/25/2011 Document Reviewed: 10/21/2009 [facility] Patient Information 2012 [place], [place].Flank Pain Flank pain is pain in your side.  HOME CARE  Home care and treatment will depend on the cause of your pain.   Some medicines can help relieve the pain. Take all medicine as told by your doctor.   Tell your doctor about any changes in your pain.   Follow up with your doctor.  GET HELP RIGHT AWAY IF:   Your pain does not get better with medicine.   The pain gets worse.   You have belly (abdominal) pain.   You are short of breath.   You always  feel sick to your stomach (nauseous).   You keep throwing up (vomiting).   You have puffiness (swelling) in your belly.   You pass out (faint).   You have a temperature by mouth above 102 F (38.9 C), not controlled by medicine.  MAKE SURE YOU:   Understand these instructions.   Will watch your condition.   Will get help right away if you are not doing well or get worse.  Document Released: 02/13/2008 Document Revised: 04/25/2011 Document Reviewed: 10/21/2009 [facility] Patient Information 2012 [place], [place].

## 2011-07-25 NOTE — ED Notes (Signed)
PT. REPORTS LEFT FLANK PAIN FOR 1 WEEK , DENIES INJURY OR DYSURIA . NO FEVER OR CHILLS.

## 2011-07-25 NOTE — ED Provider Notes (Signed)
History     CSN: [id number]  Arrival date & time 07/25/11  4098   First MD Initiated Contact with Patient 07/25/11 2016      Chief Complaint  Patient presents with  . Flank Pain     Patient is a 45 y.o. male presenting with flank pain. The history is provided by the patient.  Flank Pain The current episode started in the past 7 days. The problem occurs constantly. The problem has been gradually worsening. Associated symptoms include abdominal pain. Pertinent negatives include no anorexia, change in bowel habit, chest pain, chills, congestion, coughing, fatigue, fever, nausea or urinary symptoms.   patient reports approximately 7 day history of left lower back and flank pain. States the pain has been constant though intensity varies. States that time the pain radiates around to the left lower quadrant into the inguinal area. Denies urinary symptoms. States has had the pain before but not this severe, and states that he has been able to drink water and the pain resolves. Denies fever nausea vomiting or diarrhea or other associated history. Denies history of kidney stones.   Past Medical History  Diagnosis Date  . HIV infection   . Recurrent genital HSV (herpes simplex virus) infection   . Pilonidal cyst     Past Surgical History  Procedure Date  . Peg tube placement 2009  . Uvulectomy 2009  . Tonsillectomy   . Hernia repair     inguinal as a child    Family History  Problem Relation Age of Onset  . Heart disease Father     History  Substance Use Topics  . Smoking status: Former Games developer  . Smokeless tobacco: Never Used  . Alcohol Use: Yes      Review of Systems  Constitutional: Negative.  Negative for fever, chills and fatigue.  HENT: Negative.  Negative for congestion.   Eyes: Negative.   Respiratory: Negative.  Negative for cough.   Cardiovascular: Negative.  Negative for chest pain.  Gastrointestinal: Positive for abdominal pain. Negative for nausea, anorexia and  change in bowel habit.  Genitourinary: Positive for flank pain.  Musculoskeletal: Negative.   Skin: Negative.   Neurological: Negative.   Hematological: Negative.   Psychiatric/Behavioral: Negative.     Allergies  Penicillins  Home Medications   Current Outpatient Rx  Name Route Sig Dispense Refill  . ATAZANAVIR SULFATE 300 MG PO CAPS Oral Take 300 mg by mouth daily with breakfast.    . DOXYCYCLINE HYCLATE 20 MG PO TABS Oral Take 20 mg by mouth 2 (two) times daily.    Marland Kitchen EMTRICITABINE-TENOFOVIR 200-300 MG PO TABS Oral Take 1 tablet by mouth daily. 30 tablet 3  . HYDROCODONE-ACETAMINOPHEN 5-325 MG PO TABS Oral Take 1 tablet by mouth every 6 (six) hours as needed. For pain    . RALTEGRAVIR POTASSIUM 400 MG PO TABS Oral Take 1 tablet (400 mg total) by mouth 2 (two) times daily. 60 tablet 3  . RITONAVIR 100 MG PO TABS Oral Take 100 mg by mouth daily.    Marland Kitchen VALACYCLOVIR HCL 500 MG PO TABS Oral Take 1 tablet (500 mg total) by mouth daily. 30 tablet 6    BP 133/92  Pulse 88  Temp(Src) 98.2 F (36.8 C) (Oral)  Resp 18  SpO2 100%  Physical Exam  Constitutional: He appears well-developed and well-nourished.  HENT:  Head: Normocephalic and atraumatic.  Eyes: Conjunctivae are normal.  Neck: Neck supple.  Cardiovascular: Normal rate and regular rhythm.   Pulmonary/Chest:  Effort normal and breath sounds normal.  Abdominal: Soft. Bowel sounds are normal.  Musculoskeletal: Normal range of motion.       Back:       Legs: Neurological: He is alert.  Skin: Skin is warm and dry.  Psychiatric: He has a normal mood and affect.    ED Course  Procedures   The patient reports pain much improved with medication. Findings and clinical impression discussed with patient and his spouse. Will plan for discharge home with treatment for musculoskeletal low back pain and encourage follow up with his PCP if pain persists. Patient agreeable with plan.`   Labs Reviewed  URINALYSIS, ROUTINE W REFLEX  MICROSCOPIC   No results found.   No diagnosis found.    MDM  HPI/PE and clinical findings/Course c/w 1. musculoskeletal low back pain        Leanne Chang, NP 07/25/11 [phone]

## 2011-08-15 NOTE — ED Provider Notes (Signed)
Evaluation and management procedures were performed by the PA/NP under my supervision/collaboration.   Jayland Null D Britley Gashi, MD 08/15/11 0953 

## 2011-10-21 ENCOUNTER — Other Ambulatory Visit: Payer: Self-pay | Admitting: *Deleted

## 2011-10-21 DIAGNOSIS — B20 Human immunodeficiency virus [HIV] disease: Secondary | ICD-10-CM

## 2011-10-21 MED ORDER — RALTEGRAVIR POTASSIUM 400 MG PO TABS: 400.0000 mg | ORAL_TABLET | Freq: Two times a day (BID) | ORAL | Status: DC

## 2011-10-21 MED ORDER — ATAZANAVIR SULFATE 300 MG PO CAPS: 300.0000 mg | ORAL_CAPSULE | Freq: Every day | ORAL | Status: DC

## 2011-11-25 ENCOUNTER — Other Ambulatory Visit: Payer: Self-pay | Admitting: Licensed Clinical Social Worker

## 2011-11-25 DIAGNOSIS — B20 Human immunodeficiency virus [HIV] disease: Secondary | ICD-10-CM

## 2011-11-25 MED ORDER — RALTEGRAVIR POTASSIUM 400 MG PO TABS: 400.0000 mg | ORAL_TABLET | Freq: Two times a day (BID) | ORAL | Status: DC

## 2011-11-25 MED ORDER — RITONAVIR 100 MG PO TABS: 100.0000 mg | ORAL_TABLET | Freq: Every day | ORAL | Status: DC

## 2011-11-25 MED ORDER — EMTRICITABINE-TENOFOVIR DF 200-300 MG PO TABS: 1.0000 | ORAL_TABLET | Freq: Every day | ORAL | Status: DC

## 2011-11-29 ENCOUNTER — Other Ambulatory Visit: Payer: Self-pay | Admitting: Orthopedic Surgery

## 2011-12-03 NOTE — H&P (Signed)
  Subjective: Patient finishes Supartz injections back on 23 July 2011 and now has recurrence of catching popping and pain to the medial aspect of his right knee.  He does have a history of sleep apnea and weighs about 345 pounds.  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  PAST MEDICAL HISTORY:  He is allergic to penicillin.  He is not a diabetic.  No elevated cholesterol.  He has had some weight loss over the last 2 years because of HIV positivity.  He wears glasses, has a history of asthma and pneumonia.  Had a stomach ulcer in 1996.  Sleep apnea in 1996 to present and has a history of anemia dating back to 85.  No history of blood thinners.  FAMILY HISTORY:   Positive for high blood pressure and heart disease.  SOCIAL HISTORY:   He does not smoke.  He rarely uses alcohol.  He is divorced.  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  ROS: Patient denies dizziness, nausea, fever, chills, vomiting, shortness of breath, chest pain, loss of appetite, or rash.    PHYSICAL EXAM: Well-developed, well-nourished.  Awake, alert, and oriented x3.  Extraocular motion is intact.  No use of accessory respiratory muscles for breathing.   Cardiovascular exam reveals a regular rhythm.  Skin is intact without cuts, scrapes, or abrasions. Tender along the medial joint line of the right knee.  McMurray's test reproduces pain but occasional catching.  Assess: Medial meniscal tear with chondromalacia, right knee.  Having failed conservative treatment with anti-inflammatory medicines, cortisone injections, and Supartz.  Plan: We will get him set up for arthroscopic evaluation and treatment of his right knee.  [facility] to his history of sleep apnea and his body habitus.  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

## 2011-12-04 ENCOUNTER — Encounter (HOSPITAL_BASED_OUTPATIENT_CLINIC_OR_DEPARTMENT_OTHER): Payer: Self-pay | Admitting: *Deleted

## 2011-12-04 NOTE — Progress Notes (Signed)
Pt is 344lb Probably has slepp apnea-never tested Did tell him he may need to stay overnight if any issues-to bring all meds and overnight bag Reviewed with dr Inda Merlin

## 2011-12-11 ENCOUNTER — Encounter (HOSPITAL_BASED_OUTPATIENT_CLINIC_OR_DEPARTMENT_OTHER): Payer: Self-pay

## 2011-12-11 ENCOUNTER — Ambulatory Visit (HOSPITAL_BASED_OUTPATIENT_CLINIC_OR_DEPARTMENT_OTHER)
Admission: RE | Admit: 2011-12-11 | Discharge: 2011-12-11 | Disposition: A | Discharge status: Home / Self Care | Payer: MEDICARE | Source: Ambulatory Visit | Attending: Orthopedic Surgery | Admitting: Orthopedic Surgery

## 2011-12-11 ENCOUNTER — Encounter (HOSPITAL_BASED_OUTPATIENT_CLINIC_OR_DEPARTMENT_OTHER): Payer: Self-pay | Admitting: Anesthesiology

## 2011-12-11 ENCOUNTER — Encounter (HOSPITAL_BASED_OUTPATIENT_CLINIC_OR_DEPARTMENT_OTHER)
Admission: RE | Disposition: A | Discharge status: Home / Self Care | Payer: Self-pay | Source: Ambulatory Visit | Attending: Orthopedic Surgery

## 2011-12-11 HISTORY — DX: Unspecified osteoarthritis, unspecified site: M19.90

## 2011-12-11 HISTORY — DX: Sleep apnea, unspecified: G47.30

## 2011-12-11 SURGERY — ARTHROSCOPY, KNEE
Anesthesia: Choice | Laterality: Right

## 2011-12-11 MED ORDER — VANCOMYCIN HCL 1000 MG IV SOLR: 1500.0000 mg | INTRAVENOUS | Status: DC

## 2011-12-11 MED ORDER — DEXTROSE-NACL 5-0.45 % IV SOLN: INTRAVENOUS | Status: DC

## 2011-12-11 MED ORDER — CHLORHEXIDINE GLUCONATE 4 % EX LIQD: 60.0000 mL | Freq: Once | CUTANEOUS | Status: DC

## 2011-12-11 SURGICAL SUPPLY — 37 items
BANDAGE ELASTIC 6 VELCRO ST LF (GAUZE/BANDAGES/DRESSINGS) IMPLANT
BLADE 4.2CUDA (BLADE) IMPLANT
BLADE CUTTER GATOR 3.5 (BLADE) IMPLANT
BLADE GREAT WHITE 4.2 (BLADE) IMPLANT
CANISTER OMNI JUG 16 LITER (MISCELLANEOUS) IMPLANT
CANISTER SUCTION 2500CC (MISCELLANEOUS) IMPLANT
CHLORAPREP W/TINT 26ML (MISCELLANEOUS) IMPLANT
CLOTH BEACON ORANGE TIMEOUT ST (SAFETY) IMPLANT
DRAPE ARTHROSCOPY W/POUCH 114 (DRAPES) IMPLANT
ELECT MENISCUS 165MM 90D (ELECTRODE) IMPLANT
ELECT REM PT RETURN 9FT ADLT (ELECTROSURGICAL)
ELECTRODE REM PT RTRN 9FT ADLT (ELECTROSURGICAL) IMPLANT
GAUZE XEROFORM 1X8 LF (GAUZE/BANDAGES/DRESSINGS) IMPLANT
GLOVE BIO SURGEON STRL SZ7 (GLOVE) IMPLANT
GLOVE BIO SURGEON STRL SZ7.5 (GLOVE) IMPLANT
GLOVE BIOGEL PI IND STRL 7.0 (GLOVE) IMPLANT
GLOVE BIOGEL PI IND STRL 8 (GLOVE) IMPLANT
GLOVE BIOGEL PI INDICATOR 7.0 (GLOVE)
GLOVE BIOGEL PI INDICATOR 8 (GLOVE)
GOWN PREVENTION PLUS XLARGE (GOWN DISPOSABLE) IMPLANT
KNEE WRAP E Z 3 GEL PACK (MISCELLANEOUS) IMPLANT
NDL SAFETY ECLIPSE 18X1.5 (NEEDLE) IMPLANT
NEEDLE FILTER BLUNT 18X 1/2SAF (NEEDLE)
NEEDLE FILTER BLUNT 18X1 1/2 (NEEDLE) IMPLANT
NEEDLE HYPO 18GX1.5 SHARP (NEEDLE)
PACK ARTHROSCOPY DSU (CUSTOM PROCEDURE TRAY) IMPLANT
PACK BASIN DAY SURGERY FS (CUSTOM PROCEDURE TRAY) IMPLANT
PENCIL BUTTON HOLSTER BLD 10FT (ELECTRODE) IMPLANT
SET ARTHROSCOPY TUBING (MISCELLANEOUS)
SET ARTHROSCOPY TUBING LN (MISCELLANEOUS) IMPLANT
SLEEVE SCD COMPRESS KNEE MED (MISCELLANEOUS) IMPLANT
SPONGE GAUZE 4X4 12PLY (GAUZE/BANDAGES/DRESSINGS) IMPLANT
SYR 3ML 18GX1 1/2 (SYRINGE) IMPLANT
SYR 5ML LL (SYRINGE) IMPLANT
TOWEL OR 17X24 6PK STRL BLUE (TOWEL DISPOSABLE) IMPLANT
WAND STAR VAC 90 (SURGICAL WAND) IMPLANT
WATER STERILE IRR 1000ML POUR (IV SOLUTION) IMPLANT

## 2011-12-11 NOTE — Progress Notes (Signed)
Surgery r/s-pt ate Reminded not to eat-take am meds as reviewed-arrive 1130

## 2011-12-11 NOTE — Anesthesia Preprocedure Evaluation (Deleted)
Anesthesia Evaluation  Patient identified by MRN, date of birth, ID band Patient awake    Reviewed: Allergy & Precautions, H&P , NPO status , Patient's Chart, lab work & pertinent test results  History of Anesthesia Complications Negative for: history of anesthetic complications  Airway       Dental   Pulmonary sleep apnea , pneumonia - (s/p pneumocystis), resolved,          Cardiovascular     Neuro/Psych    GI/Hepatic   Endo/Other  Morbid obesity  Renal/GU      Musculoskeletal   Abdominal   Peds  Hematology   Anesthesia Other Findings HIV  Reproductive/Obstetrics                           Anesthesia Physical Anesthesia Plan Anesthesia Quick Evaluation

## 2011-12-12 ENCOUNTER — Encounter (HOSPITAL_BASED_OUTPATIENT_CLINIC_OR_DEPARTMENT_OTHER): Payer: Self-pay | Admitting: Orthopedic Surgery

## 2011-12-16 ENCOUNTER — Encounter (HOSPITAL_BASED_OUTPATIENT_CLINIC_OR_DEPARTMENT_OTHER)
Admission: RE | Disposition: A | Discharge status: Home / Self Care | Payer: Self-pay | Source: Ambulatory Visit | Attending: Orthopedic Surgery

## 2011-12-16 ENCOUNTER — Ambulatory Visit (HOSPITAL_BASED_OUTPATIENT_CLINIC_OR_DEPARTMENT_OTHER): Payer: MEDICARE | Admitting: Anesthesiology

## 2011-12-16 ENCOUNTER — Ambulatory Visit (HOSPITAL_BASED_OUTPATIENT_CLINIC_OR_DEPARTMENT_OTHER)
Admission: RE | Admit: 2011-12-16 | Discharge: 2011-12-16 | Disposition: A | Discharge status: Home / Self Care | Payer: MEDICARE | Source: Ambulatory Visit | Attending: Orthopedic Surgery | Admitting: Orthopedic Surgery

## 2011-12-16 ENCOUNTER — Encounter (HOSPITAL_BASED_OUTPATIENT_CLINIC_OR_DEPARTMENT_OTHER): Payer: Self-pay | Admitting: *Deleted

## 2011-12-16 ENCOUNTER — Encounter (HOSPITAL_BASED_OUTPATIENT_CLINIC_OR_DEPARTMENT_OTHER): Payer: Self-pay | Admitting: Anesthesiology

## 2011-12-16 DIAGNOSIS — M23302 Other meniscus derangements, unspecified lateral meniscus, unspecified knee: Secondary | ICD-10-CM | POA: Insufficient documentation

## 2011-12-16 DIAGNOSIS — M23206 Derangement of unspecified meniscus due to old tear or injury, right knee: Secondary | ICD-10-CM

## 2011-12-16 DIAGNOSIS — M23305 Other meniscus derangements, unspecified medial meniscus, unspecified knee: Secondary | ICD-10-CM | POA: Insufficient documentation

## 2011-12-16 DIAGNOSIS — G4730 Sleep apnea, unspecified: Secondary | ICD-10-CM | POA: Insufficient documentation

## 2011-12-16 HISTORY — PX: CHONDROPLASTY: SHX5177

## 2011-12-16 LAB — POCT HEMOGLOBIN-HEMACUE: Hemoglobin: 14.5 g/dL (ref 13.0–17.0)

## 2011-12-16 SURGERY — ARTHROSCOPY, KNEE, WITH MEDIAL MENISCECTOMY
Anesthesia: General | Site: Knee | Laterality: Right | Wound class: Clean

## 2011-12-16 MED ORDER — ONDANSETRON HCL 4 MG/2ML IJ SOLN: 4.0000 mg | Freq: Once | INTRAMUSCULAR | Status: DC | PRN

## 2011-12-16 MED ORDER — LACTATED RINGERS IV SOLN
INTRAVENOUS | Status: DC
  Administered 2011-12-16: 20 mL/h via INTRAVENOUS
  Administered 2011-12-16: 10:00:00 via INTRAVENOUS

## 2011-12-16 MED ORDER — DEXAMETHASONE SODIUM PHOSPHATE 4 MG/ML IJ SOLN
INTRAMUSCULAR | Status: DC | PRN
  Administered 2011-12-16: 10 mg via INTRAVENOUS

## 2011-12-16 MED ORDER — BUPIVACAINE-EPINEPHRINE 0.5% -1:200000 IJ SOLN
INTRAMUSCULAR | Status: DC | PRN
  Administered 2011-12-16: 20 mL

## 2011-12-16 MED ORDER — ONDANSETRON HCL 4 MG/2ML IJ SOLN
INTRAMUSCULAR | Status: DC | PRN
  Administered 2011-12-16: 4 mg via INTRAVENOUS

## 2011-12-16 MED ORDER — SODIUM CHLORIDE 0.9 % IR SOLN
Status: DC | PRN
  Administered 2011-12-16: 4000 mL

## 2011-12-16 MED ORDER — MIDAZOLAM HCL 5 MG/5ML IJ SOLN
INTRAMUSCULAR | Status: DC | PRN
  Administered 2011-12-16: 2 mg via INTRAVENOUS

## 2011-12-16 MED ORDER — OXYCODONE-ACETAMINOPHEN 5-325 MG PO TABS: 1.0000 | ORAL_TABLET | ORAL | Status: AC | PRN

## 2011-12-16 MED ORDER — HYDROMORPHONE HCL PF 1 MG/ML IJ SOLN
0.2500 mg | INTRAMUSCULAR | Status: DC | PRN
  Administered 2011-12-16: 0.5 mg via INTRAVENOUS

## 2011-12-16 MED ORDER — PROMETHAZINE HCL 25 MG/ML IJ SOLN: 6.2500 mg | INTRAMUSCULAR | Status: DC | PRN

## 2011-12-16 MED ORDER — OXYCODONE-ACETAMINOPHEN 5-325 MG PO TABS
2.0000 | ORAL_TABLET | Freq: Once | ORAL | Status: AC
  Administered 2011-12-16: 2 via ORAL

## 2011-12-16 MED ORDER — MIDAZOLAM HCL 2 MG/2ML IJ SOLN: 0.5000 mg | Freq: Once | INTRAMUSCULAR | Status: DC | PRN

## 2011-12-16 MED ORDER — PROPOFOL 10 MG/ML IV EMUL
INTRAVENOUS | Status: DC | PRN
  Administered 2011-12-16: 300 mg via INTRAVENOUS

## 2011-12-16 MED ORDER — FENTANYL CITRATE 0.05 MG/ML IJ SOLN
INTRAMUSCULAR | Status: DC | PRN
  Administered 2011-12-16: 100 ug via INTRAVENOUS
  Administered 2011-12-16 (×2): 50 ug via INTRAVENOUS

## 2011-12-16 MED ORDER — VANCOMYCIN HCL IN DEXTROSE 1-5 GM/200ML-% IV SOLN
1000.0000 mg | Freq: Once | INTRAVENOUS | Status: AC
  Administered 2011-12-16 (×2): 1000 mg via INTRAVENOUS

## 2011-12-16 MED ORDER — MEPERIDINE HCL 25 MG/ML IJ SOLN: 6.2500 mg | INTRAMUSCULAR | Status: DC | PRN

## 2011-12-16 SURGICAL SUPPLY — 39 items
BANDAGE ELASTIC 6 VELCRO ST LF (GAUZE/BANDAGES/DRESSINGS) ×3 IMPLANT
BLADE 4.2CUDA (BLADE) IMPLANT
BLADE CUTTER GATOR 3.5 (BLADE) ×3 IMPLANT
BLADE GREAT WHITE 4.2 (BLADE) ×3 IMPLANT
CANISTER OMNI JUG 16 LITER (MISCELLANEOUS) ×3 IMPLANT
CANISTER SUCTION 2500CC (MISCELLANEOUS) IMPLANT
CHLORAPREP W/TINT 26ML (MISCELLANEOUS) ×3 IMPLANT
CLOTH BEACON ORANGE TIMEOUT ST (SAFETY) ×3 IMPLANT
DRAPE ARTHROSCOPY W/POUCH 114 (DRAPES) ×3 IMPLANT
ELECT MENISCUS 165MM 90D (ELECTRODE) IMPLANT
ELECT REM PT RETURN 9FT ADLT (ELECTROSURGICAL)
ELECTRODE REM PT RTRN 9FT ADLT (ELECTROSURGICAL) IMPLANT
GAUZE XEROFORM 1X8 LF (GAUZE/BANDAGES/DRESSINGS) ×3 IMPLANT
GLOVE BIO SURGEON STRL SZ7 (GLOVE) ×3 IMPLANT
GLOVE BIO SURGEON STRL SZ7.5 (GLOVE) ×3 IMPLANT
GLOVE BIOGEL PI IND STRL 7.0 (GLOVE) ×2 IMPLANT
GLOVE BIOGEL PI IND STRL 8 (GLOVE) ×2 IMPLANT
GLOVE BIOGEL PI INDICATOR 7.0 (GLOVE) ×1
GLOVE BIOGEL PI INDICATOR 8 (GLOVE) ×1
GLOVE ECLIPSE 6.5 STRL STRAW (GLOVE) ×3 IMPLANT
GOWN PREVENTION PLUS XLARGE (GOWN DISPOSABLE) ×9 IMPLANT
GOWN STRL REIN 2XL XLG LVL4 (GOWN DISPOSABLE) ×3 IMPLANT
KNEE WRAP E Z 3 GEL PACK (MISCELLANEOUS) ×3 IMPLANT
NDL SAFETY ECLIPSE 18X1.5 (NEEDLE) IMPLANT
NEEDLE FILTER BLUNT 18X 1/2SAF (NEEDLE) ×1
NEEDLE FILTER BLUNT 18X1 1/2 (NEEDLE) ×2 IMPLANT
NEEDLE HYPO 18GX1.5 SHARP (NEEDLE)
PACK ARTHROSCOPY DSU (CUSTOM PROCEDURE TRAY) ×3 IMPLANT
PACK BASIN DAY SURGERY FS (CUSTOM PROCEDURE TRAY) ×3 IMPLANT
PENCIL BUTTON HOLSTER BLD 10FT (ELECTRODE) IMPLANT
SET ARTHROSCOPY TUBING (MISCELLANEOUS) ×1
SET ARTHROSCOPY TUBING LN (MISCELLANEOUS) ×2 IMPLANT
SLEEVE SCD COMPRESS KNEE MED (MISCELLANEOUS) IMPLANT
SPONGE GAUZE 4X4 12PLY (GAUZE/BANDAGES/DRESSINGS) ×3 IMPLANT
SYR 3ML 18GX1 1/2 (SYRINGE) IMPLANT
SYR 5ML LL (SYRINGE) IMPLANT
TOWEL OR 17X24 6PK STRL BLUE (TOWEL DISPOSABLE) ×3 IMPLANT
WAND STAR VAC 90 (SURGICAL WAND) IMPLANT
WATER STERILE IRR 1000ML POUR (IV SOLUTION) ×3 IMPLANT

## 2011-12-16 NOTE — H&P (View-Only) (Signed)
  Subjective: Patient finishes Supartz injections back on 23 July 2011 and now has recurrence of catching popping and pain to the medial aspect of his right knee.  He does have a history of sleep apnea and weighs about 345 pounds.  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  PAST MEDICAL HISTORY:  He is allergic to penicillin.  He is not a diabetic.  No elevated cholesterol.  He has had some weight loss over the last 2 years because of HIV positivity.  He wears glasses, has a history of asthma and pneumonia.  Had a stomach ulcer in 1996.  Sleep apnea in 1996 to present and has a history of anemia dating back to 1980.  No history of blood thinners.  FAMILY HISTORY:   Positive for high blood pressure and heart disease.  SOCIAL HISTORY:   He does not smoke.  He rarely uses alcohol.  He is divorced.  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  ROS: Patient denies dizziness, nausea, fever, chills, vomiting, shortness of breath, chest pain, loss of appetite, or rash.    PHYSICAL EXAM: Well-developed, well-nourished.  Awake, alert, and oriented x3.  Extraocular motion is intact.  No use of accessory respiratory muscles for breathing.   Cardiovascular exam reveals a regular rhythm.  Skin is intact without cuts, scrapes, or abrasions. Tender along the medial joint line of the right knee.  McMurray's test reproduces pain but occasional catching.  Assess: Medial meniscal tear with chondromalacia, right knee.  Having failed conservative treatment with anti-inflammatory medicines, cortisone injections, and Supartz.  Plan: We will get him set up for arthroscopic evaluation and treatment of his right knee.  [facility] to his history of sleep apnea and his body habitus.  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  

## 2011-12-16 NOTE — Op Note (Signed)
Pre-Op Dx: Right knee medial meniscal tear, chondromalacia, possible lateral meniscal tear  Postop Dx: Right knee. 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 Complex lateral meniscal tear   Procedure: Right knee partial arthroscopic medial and lateral meniscectomies, debridement chondromalacia grade 3 focal grade 4 with abrasion arthroplasty  Surgeon: Feliberto Gottron. Turner Daniels M.D.  Assist: Shirl Harris PA-C  Anes: General LMA  EBL: Minimal  Fluids: 800 cc   Indications: Patient has catching popping and grinding to the medial aspect of the right knee. X-rays show loss of about one half the articular cartilage he is tender along the medial joint line and has failed conservative treatment with cortisone injections that provided temporary relief and Visco supplementation and that really did not help much at all. His BMI is around 45 and he is attempted to lose weight but states that his knee pain is preventing him from exercise.. Pain has recurred and patient desires elective arthroscopic evaluation and treatment of knee. Risks and benefits of surgery have been discussed and questions answered.  Procedure: Patient identified by arm band and taken to the operating room at the day [facility]. The appropriate anesthetic monitors were attached, and General LMA anesthesia was induced without difficulty. Lateral post was applied to the table and the lower extremity was prepped and draped in usual sterile fashion from the ankle to the midthigh. Time out procedure was performed. We began the operation by making standard inferior lateral and inferior medial peripatellar portals with a #11 blade allowing introduction of the arthroscope through the inferior lateral portal and the out flow to the inferior medial portal. Pump pressure was set at 100 mmHg and diagnostic arthroscopy  revealed  grade 3 to grade 4 chondromalacia the trochlea with flap tears that was treated back to a stable margin with a 3.5 mm Gator sucker shaver. On the medial side the patient had a complex parrot-beak tear of the medial meniscus medially and straight posteriorly and this was likewise debridement. The patient also had grade 3 to grade 4 chondromalacia medial femoral condyle and medial tibial plateau these flap tears were likewise debrided and abrasion arthroplasty was performed moving into the notch there were multiple notch osteophytes but the anterior cruciate ligament was intact. On the lateral side the patient complex tearing of the lateral meniscus this is debrider back to a stable margin with a straight biter, an upbiter, 4.2 gray-white sucker shaver and a 3.5 Gator sucker shaver. The knee was irrigated out normal saline solution. A dressing of xerofoam 4 x 4 dressing sponges, web roll and an Ace wrap was applied. The patient was awakened extubated and taken to the recovery without difficulty.    Signed: Nestor Lewandowsky, MD

## 2011-12-16 NOTE — Interval H&P Note (Signed)
History and Physical Interval Note:  12/16/2011 9:44 AM  Vincent Young  has presented today for surgery, with the diagnosis of right knee medial miniscus tear  The various methods of treatment have been discussed with the patient and family. After consideration of risks, benefits and other options for treatment, the patient has consented to  Procedure(s) (LRB): ARTHROSCOPY KNEE (Right) as a surgical intervention .  The patient's history has been reviewed, patient examined, no change in status, stable for surgery.  I have reviewed the patient's chart and labs.  Questions were answered to the patient's satisfaction.     Nestor Lewandowsky

## 2011-12-16 NOTE — Anesthesia Procedure Notes (Signed)
Procedure Name: LMA Insertion Date/Time: 12/16/2011 10:12 AM Performed by: Burna Cash Pre-anesthesia Checklist: Patient identified, Emergency Drugs available, Suction available and Patient being monitored Patient Re-evaluated:Patient Re-evaluated prior to inductionOxygen Delivery Method: Circle System Utilized Preoxygenation: Pre-oxygenation with 100% oxygen Intubation Type: IV induction Ventilation: Mask ventilation without difficulty LMA: LMA with gastric port inserted LMA Size: 5.0 Number of attempts: 1 Placement Confirmation: positive ETCO2 Tube secured with: Tape Dental Injury: Teeth and Oropharynx as per pre-operative assessment

## 2011-12-16 NOTE — Anesthesia Postprocedure Evaluation (Signed)
  Anesthesia Post-op Note  Patient: Vincent Young  Procedure(s) Performed: Procedure(s) (LRB): KNEE ARTHROSCOPY WITH MEDIAL MENISECTOMY (Right) KNEE ARTHROSCOPY WITH LATERAL MENISECTOMY (Right) CHONDROPLASTY (Right)  Patient Location: PACU  Anesthesia Type: General  Level of Consciousness: awake and sedated  Airway and Oxygen Therapy: Patient Spontanous Breathing and Patient connected to face mask oxygen  Post-op Pain: none  Post-op Assessment: Post-op Vital signs reviewed  Post-op Vital Signs: Reviewed  Complications: No apparent anesthesia complications

## 2011-12-16 NOTE — Anesthesia Preprocedure Evaluation (Signed)
Anesthesia Evaluation  Patient identified by MRN, date of birth, ID band Patient awake    Reviewed: Allergy & Precautions, H&P , NPO status , Patient's Chart, lab work & pertinent test results  Airway Mallampati: I TM Distance: >3 FB Neck ROM: Full    Dental  (+) Teeth Intact and Dental Advisory Given   Pulmonary  breath sounds clear to auscultation        Cardiovascular Rhythm:Regular Rate:Normal     Neuro/Psych    GI/Hepatic   Endo/Other    Renal/GU      Musculoskeletal   Abdominal   Peds  Hematology  (+) HIV,   Anesthesia Other Findings   Reproductive/Obstetrics                           Anesthesia Physical Anesthesia Plan  ASA: III  Anesthesia Plan: General   Post-op Pain Management:    Induction: Intravenous  Airway Management Planned: LMA  Additional Equipment:   Intra-op Plan:   Post-operative Plan: Extubation in OR  Informed Consent: I have reviewed the patients History and Physical, chart, labs and discussed the procedure including the risks, benefits and alternatives for the proposed anesthesia with the patient or authorized representative who has indicated his/her understanding and acceptance.   Dental advisory given  Plan Discussed with: CRNA, Anesthesiologist and Surgeon  Anesthesia Plan Comments:         Anesthesia Quick Evaluation

## 2011-12-16 NOTE — Transfer of Care (Signed)
Immediate Anesthesia Transfer of Care Note  Patient: Vincent Young  Procedure(s) Performed: Procedure(s) (LRB): KNEE ARTHROSCOPY WITH MEDIAL MENISECTOMY (Right) KNEE ARTHROSCOPY WITH LATERAL MENISECTOMY (Right) CHONDROPLASTY (Right)  Patient Location: PACU  Anesthesia Type: General  Level of Consciousness: awake  Airway & Oxygen Therapy: Patient Spontanous Breathing and Patient connected to face mask oxygen  Post-op Assessment: Report given to PACU RN and Post -op Vital signs reviewed and stable  Post vital signs: Reviewed and stable  Complications: No apparent anesthesia complications

## 2011-12-17 ENCOUNTER — Encounter (HOSPITAL_BASED_OUTPATIENT_CLINIC_OR_DEPARTMENT_OTHER): Payer: Self-pay | Admitting: Orthopedic Surgery

## 2012-01-03 ENCOUNTER — Telehealth: Payer: Self-pay | Admitting: Licensed Clinical Social Worker

## 2012-01-03 NOTE — Telephone Encounter (Signed)
I called and left a message to remind the patient to schedule his 6 month f/u visit and labs with / Dr. Luciana Axe.

## 2012-03-03 ENCOUNTER — Other Ambulatory Visit: Payer: MEDICARE

## 2012-03-03 ENCOUNTER — Other Ambulatory Visit (HOSPITAL_COMMUNITY)
Admission: RE | Admit: 2012-03-03 | Discharge: 2012-03-03 | Disposition: A | Discharge status: Home / Self Care | Payer: Medicare Other | Source: Ambulatory Visit | Attending: Internal Medicine | Admitting: Internal Medicine

## 2012-03-03 ENCOUNTER — Other Ambulatory Visit: Payer: Self-pay | Admitting: Internal Medicine

## 2012-03-03 DIAGNOSIS — B20 Human immunodeficiency virus [HIV] disease: Secondary | ICD-10-CM

## 2012-03-03 DIAGNOSIS — Z113 Encounter for screening for infections with a predominantly sexual mode of transmission: Secondary | ICD-10-CM | POA: Insufficient documentation

## 2012-03-03 LAB — COMPREHENSIVE METABOLIC PANEL
ALT: 17 U/L (ref 0–53)
AST: 18 U/L (ref 0–37)
Albumin: 3.7 g/dL (ref 3.5–5.2)
Alkaline Phosphatase: 102 U/L (ref 39–117)
BUN: 16 mg/dL (ref 6–23)
CO2: 25 mEq/L (ref 19–32)
Calcium: 9.2 mg/dL (ref 8.4–10.5)
Chloride: 103 mEq/L (ref 96–112)
Creat: 1.1 mg/dL (ref 0.50–1.35)
Glucose, Bld: 116 mg/dL — ABNORMAL HIGH (ref 70–99)
Potassium: 4.5 mEq/L (ref 3.5–5.3)
Sodium: 139 mEq/L (ref 135–145)
Total Bilirubin: 2.5 mg/dL — ABNORMAL HIGH (ref 0.3–1.2)
Total Protein: 6.9 g/dL (ref 6.0–8.3)

## 2012-03-03 LAB — LIPID PANEL
Cholesterol: 179 mg/dL (ref 0–200)
HDL: 40 mg/dL (ref >39)
LDL Cholesterol: 123 mg/dL — ABNORMAL HIGH (ref 0–99)
Total CHOL/HDL Ratio: 4.5 Ratio
Triglycerides: 80 mg/dL (ref <150)
VLDL: 16 mg/dL (ref 0–40)

## 2012-03-03 LAB — CBC WITH DIFFERENTIAL/PLATELET
Basophils Absolute: 0 10*3/uL (ref 0.0–0.1)
Basophils Relative: 0 % (ref 0–1)
Eosinophils Absolute: 0.2 10*3/uL (ref 0.0–0.7)
Eosinophils Relative: 2 % (ref 0–5)
HCT: 42.9 % (ref 39.0–52.0)
Hemoglobin: 14.5 g/dL (ref 13.0–17.0)
Lymphocytes Relative: 41 % (ref 12–46)
Lymphs Abs: 3.2 10*3/uL (ref 0.7–4.0)
MCH: 31 pg (ref 26.0–34.0)
MCHC: 33.8 g/dL (ref 30.0–36.0)
MCV: 91.9 fL (ref 78.0–100.0)
Monocytes Absolute: 0.5 10*3/uL (ref 0.1–1.0)
Monocytes Relative: 7 % (ref 3–12)
Neutro Abs: 3.8 10*3/uL (ref 1.7–7.7)
Neutrophils Relative %: 50 % (ref 43–77)
Platelets: 305 10*3/uL (ref 150–400)
RBC: 4.67 MIL/uL (ref 4.22–5.81)
RDW: 14.3 % (ref 11.5–15.5)
WBC: 7.7 10*3/uL (ref 4.0–10.5)

## 2012-03-03 LAB — RPR

## 2012-03-04 LAB — T-HELPER CELL (CD4) - (RCID CLINIC ONLY)
CD4 % Helper T Cell: 19 % — ABNORMAL LOW (ref 33–55)
CD4 T Cell Abs: 610 uL (ref 400–2700)

## 2012-03-04 LAB — HIV-1 RNA QUANT-NO REFLEX-BLD
HIV 1 RNA Quant: 28 copies/mL — ABNORMAL HIGH (ref <20)
HIV-1 RNA Quant, Log: 1.45 {Log} — ABNORMAL HIGH (ref <1.30)

## 2012-03-05 ENCOUNTER — Other Ambulatory Visit: Payer: MEDICARE

## 2012-03-18 ENCOUNTER — Other Ambulatory Visit: Payer: Self-pay | Admitting: *Deleted

## 2012-03-24 ENCOUNTER — Ambulatory Visit: Payer: MEDICARE | Admitting: Internal Medicine

## 2012-04-09 ENCOUNTER — Encounter: Payer: Self-pay | Admitting: Internal Medicine

## 2012-04-09 ENCOUNTER — Ambulatory Visit (INDEPENDENT_AMBULATORY_CARE_PROVIDER_SITE_OTHER): Payer: Medicare Other | Admitting: Internal Medicine

## 2012-04-09 VITALS — BP 150/98 | HR 103 | Temp 98.3°F | Ht 74.0 in | Wt 360.0 lb

## 2012-04-09 DIAGNOSIS — R142 Eructation: Secondary | ICD-10-CM

## 2012-04-09 DIAGNOSIS — R140 Abdominal distension (gaseous): Secondary | ICD-10-CM | POA: Insufficient documentation

## 2012-04-09 DIAGNOSIS — R141 Gas pain: Secondary | ICD-10-CM

## 2012-04-09 DIAGNOSIS — B20 Human immunodeficiency virus [HIV] disease: Secondary | ICD-10-CM

## 2012-04-09 NOTE — Assessment & Plan Note (Signed)
He continues to do well and I will followup with him in 6 months.  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 I may be able to start him on Stribild. He has in the past taken Sustiva and Combivir.

## 2012-04-09 NOTE — Progress Notes (Signed)
  Subjective:    Patient ID: Vincent Young, male    DOB: 1966/02/12, 46 y.o.   MRN: [id number]  HPI He comes in for routine followup of his HIV care.  He continues on his regimen of Reyataz, norvir, Truvada and Isentress.  He reports great adherence with no missed doses. He has followed up with [facility] in the past and does tell me that they have recommended streamlining his regimen, though I do not have access to his resistance mutations. He does tell me he is going to get them sent here. He does have some abdominal discomfort and bloating which started after being started on azithromycin. He has had continued weight gain.   Review of Systems  Constitutional: Negative for fever, fatigue and unexpected weight change.  HENT: Negative for sore throat and trouble swallowing.   Respiratory: Negative for cough and shortness of breath.   Cardiovascular: Negative for palpitations and leg swelling.  Gastrointestinal: Positive for abdominal distention. Negative for nausea, abdominal pain and diarrhea.  Musculoskeletal: Negative for myalgias, joint swelling and arthralgias.  Skin: Negative for rash.  Neurological: Negative for dizziness and headaches.       Objective:   Physical Exam  Constitutional: He appears well-developed and well-nourished. No distress.       Morbidly obese  Cardiovascular: Normal rate, regular rhythm and normal heart sounds.  Exam reveals no gallop and no friction rub.   No murmur heard. Pulmonary/Chest: Effort normal and breath sounds normal. No respiratory distress. He has no wheezes. He has no rales.  Lymphadenopathy:    He has no cervical adenopathy.          Assessment & Plan:

## 2012-04-09 NOTE — Assessment & Plan Note (Signed)
He does have abdominal bloating and gas that I suspect is related to his antibiotics. I told him that this may persist for up to 2 weeks but should resolve at that time off of the antibiotic. He will call if he has any further issues with that.

## 2012-04-27 ENCOUNTER — Other Ambulatory Visit: Payer: Self-pay | Admitting: *Deleted

## 2012-04-27 DIAGNOSIS — B20 Human immunodeficiency virus [HIV] disease: Secondary | ICD-10-CM

## 2012-04-27 DIAGNOSIS — B009 Herpesviral infection, unspecified: Secondary | ICD-10-CM

## 2012-04-27 MED ORDER — RALTEGRAVIR POTASSIUM 400 MG PO TABS: 400.0000 mg | ORAL_TABLET | Freq: Two times a day (BID) | ORAL | Status: DC

## 2012-04-27 MED ORDER — ATAZANAVIR SULFATE 300 MG PO CAPS: 300.0000 mg | ORAL_CAPSULE | Freq: Every day | ORAL | Status: DC

## 2012-04-27 MED ORDER — VALACYCLOVIR HCL 500 MG PO TABS: 500.0000 mg | ORAL_TABLET | Freq: Every day | ORAL | Status: DC

## 2012-04-27 MED ORDER — EMTRICITABINE-TENOFOVIR DF 200-300 MG PO TABS: 1.0000 | ORAL_TABLET | Freq: Every day | ORAL | Status: DC

## 2012-04-27 MED ORDER — RITONAVIR 100 MG PO TABS: 100.0000 mg | ORAL_TABLET | Freq: Every day | ORAL | Status: DC

## 2012-08-24 ENCOUNTER — Encounter (HOSPITAL_COMMUNITY): Payer: Self-pay | Admitting: Emergency Medicine

## 2012-08-24 ENCOUNTER — Ambulatory Visit (HOSPITAL_COMMUNITY)
Admit: 2012-08-24 | Discharge: 2012-08-24 | Disposition: A | Discharge status: Home / Self Care | Payer: Medicare Other | Attending: Internal Medicine | Admitting: Internal Medicine

## 2012-08-24 ENCOUNTER — Emergency Department (HOSPITAL_COMMUNITY)
Admission: EM | Admit: 2012-08-24 | Discharge: 2012-08-24 | Disposition: A | Discharge status: Home / Self Care | Payer: Medicare Other | Source: Home / Self Care

## 2012-08-24 DIAGNOSIS — M79609 Pain in unspecified limb: Secondary | ICD-10-CM | POA: Insufficient documentation

## 2012-08-24 DIAGNOSIS — R252 Cramp and spasm: Secondary | ICD-10-CM

## 2012-08-24 LAB — POCT I-STAT, CHEM 8
BUN: 17 mg/dL (ref 6–23)
Calcium, Ion: 1.14 mmol/L (ref 1.12–1.23)
Chloride: 104 mEq/L (ref 96–112)
Creatinine, Ser: 1.2 mg/dL (ref 0.50–1.35)
Glucose, Bld: 116 mg/dL — ABNORMAL HIGH (ref 70–99)
HCT: 48 % (ref 39.0–52.0)
Hemoglobin: 16.3 g/dL (ref 13.0–17.0)
Potassium: 4.1 mEq/L (ref 3.5–5.1)
Sodium: 140 mEq/L (ref 135–145)
TCO2: 30 mmol/L (ref 0–100)

## 2012-08-24 MED ORDER — CYCLOBENZAPRINE HCL 10 MG PO TABS: 5.0000 mg | ORAL_TABLET | Freq: Three times a day (TID) | ORAL | Status: DC | PRN

## 2012-08-24 MED ORDER — ACETAMINOPHEN 500 MG PO TABS: 500.0000 mg | ORAL_TABLET | Freq: Three times a day (TID) | ORAL | Status: DC | PRN

## 2012-08-24 NOTE — ED Notes (Signed)
Called vascular lab and spoke with Soddy-Daisy;  She stated to send patient to out patient registration and then to vascular lab.  Patient and family made aware.  Vincent Young will call [phone] with results and further instructions

## 2012-08-24 NOTE — ED Notes (Signed)
Patient Demographics  Vincent Young, is a 47 y.o. male  NFA:[id number]  ION:[id number]  DOB - 12/08/1965  Chief Complaint  Patient presents with  . Leg Pain        Subjective:   Laurey Morale history of HIV follows with Dr. Luciana Axe, also has bilateral knee osteoarthritis and required cortisone shots about a week ago in both knees, presents with 7-10 day history of bilateral calf  cramping, he also feels little discomfort behind both knees, he says this happens on a intermittent basis, no aggravating or relieving factors no other physical symptoms, he denies any personal history of blood clots but has family history in multiple family members including his mother of blood clots. No chest pain no shortness of breath no cough. Denies any recent strenuous exercise, leg injuries et Karie Soda.   Objective:   Past Medical History  Diagnosis Date  . HIV infection   . Recurrent genital HSV (herpes simplex virus) infection   . Pilonidal cyst   . Arthritis   . Sleep apnea     never tested-has s/s      Past Surgical History  Procedure Laterality Date  . Peg tube placement  2009  . Uvulectomy  2009  . Tonsillectomy    . Hernia repair      inguinal as a child  . Knee arthroscopy  12/11/2011    Procedure: ARTHROSCOPY KNEE;  Surgeon: Nestor Lewandowsky, MD;  Location: [facility];  Service: Orthopedics;  Laterality: Right;  . 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.AT,PERRAL Supple Neck,No JVD, No cervical lymphadenopathy  appriciated.  Symmetrical Chest wall movement, Good air movement bilaterally, CTAB RRR,No Gallops,Rubs or new Murmurs, No Parasternal Heave +ve B.Sounds, Abd Soft, Non tender, No organomegaly appriciated, No rebound - guarding or rigidity. No Cyanosis, Clubbing, risk chronic bilateral leg edema, negative Homan bilaterally, No new Rash or bruise     Data Review   CBC  Recent Labs Lab 08/24/12 1815  HGB 16.3  HCT 48.0    Chemistries    Recent Labs Lab 08/24/12 1815  NA 140  K 4.1  CL 104  GLUCOSE 116*  BUN 17  CREATININE 1.20   ------------------------------------------------------------------------------------------------------------------ No results found for this basename: HGBA1C,  in the last 72 hours ------------------------------------------------------------------------------------------------------------------ No results found for this basename: CHOL, HDL, LDLCALC, TRIG, CHOLHDL, LDLDIRECT,  in the last 72 hours ------------------------------------------------------------------------------------------------------------------ No results found for this basename: TSH, T4TOTAL, FREET3, T3FREE, THYROIDAB,  in the last 72 hours ------------------------------------------------------------------------------------------------------------------ No results found for this basename: VITAMINB12, FOLATE, FERRITIN, TIBC, IRON, RETICCTPCT,  in the last 72 hours  Coagulation profile  No results found for this basename: INR, PROTIME,  in the last 168 hours     Prior to Admission medications   Medication Sig Start Date End Date Taking? Authorizing Provider  atazanavir (REYATAZ) 300 MG capsule Take 1 capsule (300 mg total) by mouth daily with breakfast. 04/27/12   Gardiner Barefoot, MD  emtricitabine-tenofovir (TRUVADA) 200-300 MG per tablet Take  1 tablet by mouth daily. 04/27/12   Gardiner Barefoot, MD  raltegravir (ISENTRESS) 400 MG tablet Take 1 tablet (400 mg total) by mouth 2 (two)  times daily. 04/27/12   Gardiner Barefoot, MD  ritonavir (NORVIR) 100 MG TABS Take 1 tablet (100 mg total) by mouth daily. 04/27/12   Gardiner Barefoot, MD  valACYclovir (VALTREX) 500 MG tablet Take 1 tablet (500 mg total) by mouth daily. 04/27/12   Gardiner Barefoot, MD     Assessment & Plan   Bilateral calf cramping and pain- since he had significant family history of DVTs lower extremity venous duplex bilaterally were obtained and were negative, this is likely musculoskeletal muscle cramping, lites are stable, over-the-counter tylenol along with muscle relaxants prescription provided. Requested to follow with PCP in a week.    Follow-up Information   Follow up with [facility], MD. Schedule an appointment as soon as possible for a visit in 2 days.   Contact information:   [address] [place] [address] [phone]       Follow up with Staci Righter, MD. Schedule an appointment as soon as possible for a visit in 1 week.   Contact information:   [address] [place] [address] [phone]        Leroy Sea M.D on 08/24/2012 at 7:28 PM   Leroy Sea, MD 08/24/12 [phone]

## 2012-08-24 NOTE — Progress Notes (Signed)
VASCULAR LAB PRELIMINARY  PRELIMINARY  PRELIMINARY  PRELIMINARY  Bilateral lower extremity venous duplex  completed.    Preliminary report:  Bilateral:  No evidence of DVT, superficial thrombosis, or Baker's Cyst.    Zay Yeargan, RVT 08/24/2012, 7:22 PM

## 2012-08-24 NOTE — ED Notes (Signed)
Pt c/o bilateral leg pain x 1 week. Pt reports he had cortisone injection last Tuesday and was given Hydrocodone for pain with no relief. Difficulty ambulating due to pain. Cramping and numbness in both calf muscles. Patient is alert and oriented.

## 2012-10-08 ENCOUNTER — Other Ambulatory Visit: Payer: Medicare Other

## 2012-10-22 ENCOUNTER — Encounter: Payer: Self-pay | Admitting: Internal Medicine

## 2012-10-22 ENCOUNTER — Ambulatory Visit (INDEPENDENT_AMBULATORY_CARE_PROVIDER_SITE_OTHER): Payer: Medicare Other | Admitting: Internal Medicine

## 2012-10-22 VITALS — BP 143/99 | HR 102 | Temp 98.1°F | Ht 75.0 in | Wt 355.0 lb

## 2012-10-22 DIAGNOSIS — B20 Human immunodeficiency virus [HIV] disease: Secondary | ICD-10-CM

## 2012-10-22 LAB — CBC WITH DIFFERENTIAL/PLATELET
Basophils Absolute: 0 10*3/uL (ref 0.0–0.1)
Basophils Relative: 0 % (ref 0–1)
Eosinophils Absolute: 0 10*3/uL (ref 0.0–0.7)
Eosinophils Relative: 0 % (ref 0–5)
HCT: 49.4 % (ref 39.0–52.0)
Hemoglobin: 16.9 g/dL (ref 13.0–17.0)
Lymphocytes Relative: 29 % (ref 12–46)
Lymphs Abs: 3.2 10*3/uL (ref 0.7–4.0)
MCH: 31.6 pg (ref 26.0–34.0)
MCHC: 34.2 g/dL (ref 30.0–36.0)
MCV: 92.5 fL (ref 78.0–100.0)
Monocytes Absolute: 0.6 10*3/uL (ref 0.1–1.0)
Monocytes Relative: 5 % (ref 3–12)
Neutro Abs: 7.5 10*3/uL (ref 1.7–7.7)
Neutrophils Relative %: 66 % (ref 43–77)
Platelets: 143 10*3/uL — ABNORMAL LOW (ref 150–400)
RBC: 5.34 MIL/uL (ref 4.22–5.81)
RDW: 15.5 % (ref 11.5–15.5)
WBC: 11.3 10*3/uL — ABNORMAL HIGH (ref 4.0–10.5)

## 2012-10-22 LAB — COMPLETE METABOLIC PANEL WITH GFR
ALT: 48 U/L (ref 0–53)
AST: 21 U/L (ref 0–37)
Albumin: 3.5 g/dL (ref 3.5–5.2)
Alkaline Phosphatase: 103 U/L (ref 39–117)
BUN: 20 mg/dL (ref 6–23)
CO2: 25 mEq/L (ref 19–32)
Calcium: 9.4 mg/dL (ref 8.4–10.5)
Chloride: 101 mEq/L (ref 96–112)
Creat: 1.14 mg/dL (ref 0.50–1.35)
GFR, Est African American: 87 mL/min
GFR, Est Non African American: 75 mL/min
Glucose, Bld: 119 mg/dL — ABNORMAL HIGH (ref 70–99)
Potassium: 4.9 mEq/L (ref 3.5–5.3)
Sodium: 138 mEq/L (ref 135–145)
Total Bilirubin: 3 mg/dL — ABNORMAL HIGH (ref 0.3–1.2)
Total Protein: 6.3 g/dL (ref 6.0–8.3)

## 2012-10-22 NOTE — Assessment & Plan Note (Signed)
He does have significant resistance medications and despite this continues to miss doses. Indications were reviewed. I will check again for a new genotype as well as integrase genotype in case he is developing more resistance and I did again emphasized the absolute need to be compliant. I discussed the b.i.d. Dosing but he is not having any problems with the second dose it is just missing complete days of medications. I will consider changing him to once a day tivicay to improve his compliance next visit.

## 2012-10-22 NOTE — Assessment & Plan Note (Signed)
I did again continued to discuss his weight loss. He is motivated and trying to lose weight. I discussed diet modifications and exercise

## 2012-10-22 NOTE — Progress Notes (Signed)
  Subjective:    Patient ID: Vincent Young, male    DOB: 05/10/1966, 47 y.o.   MRN: [id number]  HPI He comes in for followup of his HIV. He continues on Norvir, Prezista, Truvada and Isentress.  He misses medicine at least twice a week. He has some underlying resistance though no resistance test is available since this was done at the [facility] and is not available to me. Despite the development of resistance in the past, he still continues to miss doses despite understanding the importance of taking his medicine. He does had no new problems including no diarrhea, rashes or other issues.  He does continue to struggle with weight loss. He does get injections in his knees do to lose weight and his knees are better and he is going to increase his activity and is trying to eat better.   Review of Systems  Constitutional: Negative for fatigue and unexpected weight change.  HENT: Negative for sore throat and trouble swallowing.   Gastrointestinal: Negative for nausea and diarrhea.  Skin: Negative for rash.  Neurological: Negative for dizziness and headaches.       Objective:   Physical Exam  Constitutional: He appears well-developed and well-nourished.  obese  HENT:  Mouth/Throat: Oropharynx is clear and moist. No oropharyngeal exudate.  Cardiovascular: Normal rate, regular rhythm and normal heart sounds.  Exam reveals no gallop and no friction rub.   No murmur heard. Pulmonary/Chest: Effort normal and breath sounds normal. No respiratory distress. He has no wheezes.  Lymphadenopathy:    He has no cervical adenopathy.  Skin: No rash noted.          Assessment & Plan:

## 2012-10-23 LAB — HIV-1 RNA ULTRAQUANT REFLEX TO GENTYP+
HIV 1 RNA Quant: <20 copies/mL (ref <20)
HIV-1 RNA Quant, Log: <1.3 {Log} (ref <1.30)

## 2012-10-23 LAB — T-HELPER CELL (CD4) - (RCID CLINIC ONLY)
CD4 % Helper T Cell: 13 % — ABNORMAL LOW (ref 33–55)
CD4 T Cell Abs: 430 uL (ref 400–2700)

## 2012-10-26 ENCOUNTER — Other Ambulatory Visit: Payer: Self-pay | Admitting: Licensed Clinical Social Worker

## 2012-10-26 DIAGNOSIS — B20 Human immunodeficiency virus [HIV] disease: Secondary | ICD-10-CM

## 2012-10-26 MED ORDER — ATAZANAVIR SULFATE 300 MG PO CAPS: 300.0000 mg | ORAL_CAPSULE | Freq: Every day | ORAL | Status: DC

## 2012-10-30 LAB — HIV-1 INTEGRASE GENOTYPE

## 2012-11-23 ENCOUNTER — Other Ambulatory Visit: Payer: Self-pay | Admitting: Licensed Clinical Social Worker

## 2012-11-23 DIAGNOSIS — B009 Herpesviral infection, unspecified: Secondary | ICD-10-CM

## 2012-11-23 MED ORDER — VALACYCLOVIR HCL 500 MG PO TABS: 500.0000 mg | ORAL_TABLET | Freq: Every day | ORAL | Status: DC

## 2013-01-19 ENCOUNTER — Other Ambulatory Visit (INDEPENDENT_AMBULATORY_CARE_PROVIDER_SITE_OTHER): Payer: Medicare Other

## 2013-01-19 DIAGNOSIS — Z79899 Other long term (current) drug therapy: Secondary | ICD-10-CM

## 2013-01-19 DIAGNOSIS — Z113 Encounter for screening for infections with a predominantly sexual mode of transmission: Secondary | ICD-10-CM

## 2013-01-19 DIAGNOSIS — B20 Human immunodeficiency virus [HIV] disease: Secondary | ICD-10-CM

## 2013-01-20 LAB — T-HELPER CELL (CD4) - (RCID CLINIC ONLY)
CD4 % Helper T Cell: 16 % — ABNORMAL LOW (ref 33–55)
CD4 T Cell Abs: 570 /uL (ref 400–2700)

## 2013-01-21 ENCOUNTER — Other Ambulatory Visit: Payer: Self-pay | Admitting: Internal Medicine

## 2013-01-21 LAB — HIV-1 RNA QUANT-NO REFLEX-BLD
HIV 1 RNA Quant: <20 copies/mL (ref <20)
HIV-1 RNA Quant, Log: <1.3 {Log} (ref <1.30)

## 2013-01-22 ENCOUNTER — Ambulatory Visit (INDEPENDENT_AMBULATORY_CARE_PROVIDER_SITE_OTHER): Payer: Self-pay | Admitting: *Deleted

## 2013-01-22 VITALS — BP 146/93 | HR 92 | Temp 98.2°F | Resp 16 | Ht 72.75 in | Wt 362.5 lb

## 2013-01-22 DIAGNOSIS — B20 Human immunodeficiency virus [HIV] disease: Secondary | ICD-10-CM

## 2013-01-22 LAB — CBC WITH DIFFERENTIAL/PLATELET
Basophils Absolute: 0 10*3/uL (ref 0.0–0.1)
Basophils Relative: 0 % (ref 0–1)
Eosinophils Absolute: 0.2 10*3/uL (ref 0.0–0.7)
Eosinophils Relative: 2 % (ref 0–5)
HCT: 42 % (ref 39.0–52.0)
Hemoglobin: 14.6 g/dL (ref 13.0–17.0)
Lymphocytes Relative: 32 % (ref 12–46)
Lymphs Abs: 3.1 10*3/uL (ref 0.7–4.0)
MCH: 33.6 pg (ref 26.0–34.0)
MCHC: 34.8 g/dL (ref 30.0–36.0)
MCV: 96.8 fL (ref 78.0–100.0)
Monocytes Absolute: 0.6 10*3/uL (ref 0.1–1.0)
Monocytes Relative: 6 % (ref 3–12)
Neutro Abs: 5.9 10*3/uL (ref 1.7–7.7)
Neutrophils Relative %: 60 % (ref 43–77)
Platelets: 282 10*3/uL (ref 150–400)
RBC: 4.34 MIL/uL (ref 4.22–5.81)
RDW: 14.5 % (ref 11.5–15.5)
WBC: 9.7 10*3/uL (ref 4.0–10.5)

## 2013-01-22 LAB — PROTIME-INR
INR: 0.93 (ref <1.50)
Prothrombin Time: 12.5 seconds (ref 11.6–15.2)

## 2013-01-22 LAB — BASIC METABOLIC PANEL
BUN: 12 mg/dL (ref 6–23)
CO2: 25 mEq/L (ref 19–32)
Calcium: 8.9 mg/dL (ref 8.4–10.5)
Chloride: 102 mEq/L (ref 96–112)
Creat: 1.13 mg/dL (ref 0.50–1.35)
Glucose, Bld: 111 mg/dL — ABNORMAL HIGH (ref 70–99)
Potassium: 4.6 mEq/L (ref 3.5–5.3)
Sodium: 137 mEq/L (ref 135–145)

## 2013-01-22 LAB — HEPATIC FUNCTION PANEL
ALT: 15 U/L (ref 0–53)
AST: 18 U/L (ref 0–37)
Albumin: 3.6 g/dL (ref 3.5–5.2)
Alkaline Phosphatase: 95 U/L (ref 39–117)
Bilirubin, Direct: 0.2 mg/dL (ref 0.0–0.3)
Indirect Bilirubin: 1.1 mg/dL — ABNORMAL HIGH (ref 0.0–0.9)
Total Bilirubin: 1.3 mg/dL — ABNORMAL HIGH (ref 0.3–1.2)
Total Protein: 7 g/dL (ref 6.0–8.3)

## 2013-01-22 LAB — APTT: aPTT: 43 seconds — ABNORMAL HIGH (ref 24–37)

## 2013-01-22 NOTE — Progress Notes (Signed)
Patient here today to screen for A5331, Aspirin study. Informed consent was obtained after he had read the consent and it was discussed with him. He recently came off of an [facility] sponsored study in [place] and is very familiar with research. He was originally diagnosed in 1995 and had started his current regimen in 2009 as part of the study at [facility]. He has occassional bilat. Knee pain and had arthroscopy done on the right knee in 2012. He has a prescription for meloxicam, but takes it rarely no more than 1 x week. He was instructed to not take any aspirin or nsaids while he is on study. He has hydrocodone he can also take for pain. Denies any prior hx of bleeding or problems within the past 6 months. If his labs are within study limits, he will have a BART procedure at [facility] on sept. 22nd and entry planned for Sept. 30th.

## 2013-01-23 ENCOUNTER — Encounter (HOSPITAL_COMMUNITY): Payer: Self-pay | Admitting: *Deleted

## 2013-01-23 DIAGNOSIS — Z87891 Personal history of nicotine dependence: Secondary | ICD-10-CM | POA: Insufficient documentation

## 2013-01-23 DIAGNOSIS — R059 Cough, unspecified: Secondary | ICD-10-CM | POA: Insufficient documentation

## 2013-01-23 DIAGNOSIS — Z79899 Other long term (current) drug therapy: Secondary | ICD-10-CM | POA: Insufficient documentation

## 2013-01-23 DIAGNOSIS — Z8619 Personal history of other infectious and parasitic diseases: Secondary | ICD-10-CM | POA: Insufficient documentation

## 2013-01-23 DIAGNOSIS — Z21 Asymptomatic human immunodeficiency virus [HIV] infection status: Secondary | ICD-10-CM | POA: Insufficient documentation

## 2013-01-23 DIAGNOSIS — M129 Arthropathy, unspecified: Secondary | ICD-10-CM | POA: Insufficient documentation

## 2013-01-23 DIAGNOSIS — R05 Cough: Secondary | ICD-10-CM | POA: Insufficient documentation

## 2013-01-23 DIAGNOSIS — Z880 Allergy status to penicillin: Secondary | ICD-10-CM | POA: Insufficient documentation

## 2013-01-23 DIAGNOSIS — R1011 Right upper quadrant pain: Secondary | ICD-10-CM | POA: Insufficient documentation

## 2013-01-23 DIAGNOSIS — R1031 Right lower quadrant pain: Secondary | ICD-10-CM | POA: Insufficient documentation

## 2013-01-23 DIAGNOSIS — R110 Nausea: Secondary | ICD-10-CM | POA: Insufficient documentation

## 2013-01-23 DIAGNOSIS — R0602 Shortness of breath: Secondary | ICD-10-CM | POA: Insufficient documentation

## 2013-01-23 LAB — COMPREHENSIVE METABOLIC PANEL
ALT: 16 U/L (ref 0–53)
AST: 17 U/L (ref 0–37)
Albumin: 3.3 g/dL — ABNORMAL LOW (ref 3.5–5.2)
Alkaline Phosphatase: 100 U/L (ref 39–117)
BUN: 16 mg/dL (ref 6–23)
CO2: 26 mEq/L (ref 19–32)
Calcium: 9.3 mg/dL (ref 8.4–10.5)
Chloride: 102 mEq/L (ref 96–112)
Creatinine, Ser: 1.19 mg/dL (ref 0.50–1.35)
GFR calc Af Amer: 81 mL/min — ABNORMAL LOW (ref >90)
GFR calc non Af Amer: 70 mL/min — ABNORMAL LOW (ref >90)
Glucose, Bld: 120 mg/dL — ABNORMAL HIGH (ref 70–99)
Potassium: 4.6 mEq/L (ref 3.5–5.1)
Sodium: 137 mEq/L (ref 135–145)
Total Bilirubin: 1.4 mg/dL — ABNORMAL HIGH (ref 0.3–1.2)
Total Protein: 7.6 g/dL (ref 6.0–8.3)

## 2013-01-23 LAB — CBC WITH DIFFERENTIAL/PLATELET
Basophils Absolute: 0 10*3/uL (ref 0.0–0.1)
Basophils Relative: 0 % (ref 0–1)
Eosinophils Absolute: 0.2 10*3/uL (ref 0.0–0.7)
Eosinophils Relative: 1 % (ref 0–5)
HCT: 42.7 % (ref 39.0–52.0)
Hemoglobin: 15 g/dL (ref 13.0–17.0)
Lymphocytes Relative: 33 % (ref 12–46)
Lymphs Abs: 3.8 10*3/uL (ref 0.7–4.0)
MCH: 34.7 pg — ABNORMAL HIGH (ref 26.0–34.0)
MCHC: 35.1 g/dL (ref 30.0–36.0)
MCV: 98.8 fL (ref 78.0–100.0)
Monocytes Absolute: 0.8 10*3/uL (ref 0.1–1.0)
Monocytes Relative: 7 % (ref 3–12)
Neutro Abs: 6.7 10*3/uL (ref 1.7–7.7)
Neutrophils Relative %: 58 % (ref 43–77)
Platelets: 265 10*3/uL (ref 150–400)
RBC: 4.32 MIL/uL (ref 4.22–5.81)
RDW: 13.7 % (ref 11.5–15.5)
WBC: 11.4 10*3/uL — ABNORMAL HIGH (ref 4.0–10.5)

## 2013-01-23 LAB — LIPASE, BLOOD: Lipase: 18 U/L (ref 11–59)

## 2013-01-23 NOTE — ED Notes (Signed)
Pt in c/o right sided abd pain over the last week with nausea and diarrhea, states he feels bloated, pain has been intermittent but has increased today

## 2013-01-24 ENCOUNTER — Emergency Department (HOSPITAL_COMMUNITY): Payer: Medicare Other

## 2013-01-24 ENCOUNTER — Encounter (HOSPITAL_COMMUNITY): Payer: Self-pay | Admitting: Radiology

## 2013-01-24 ENCOUNTER — Emergency Department (HOSPITAL_COMMUNITY)
Admission: EM | Admit: 2013-01-24 | Discharge: 2013-01-24 | Disposition: A | Discharge status: Home / Self Care | Payer: Medicare Other | Attending: Emergency Medicine | Admitting: Emergency Medicine

## 2013-01-24 DIAGNOSIS — R109 Unspecified abdominal pain: Secondary | ICD-10-CM

## 2013-01-24 LAB — URINALYSIS, ROUTINE W REFLEX MICROSCOPIC
Bilirubin Urine: NEGATIVE
Glucose, UA: NEGATIVE mg/dL
Hgb urine dipstick: NEGATIVE
Ketones, ur: NEGATIVE mg/dL
Leukocytes, UA: NEGATIVE
Nitrite: NEGATIVE
Protein, ur: NEGATIVE mg/dL
Specific Gravity, Urine: 1.027 (ref 1.005–1.030)
Urobilinogen, UA: 1 mg/dL (ref 0.0–1.0)
pH: 6 (ref 5.0–8.0)

## 2013-01-24 MED ORDER — SODIUM CHLORIDE 0.9 % IV BOLUS (SEPSIS)
1000.0000 mL | Freq: Once | INTRAVENOUS | Status: AC
  Administered 2013-01-24: 1000 mL via INTRAVENOUS

## 2013-01-24 MED ORDER — IOHEXOL 300 MG/ML  SOLN
100.0000 mL | Freq: Once | INTRAMUSCULAR | Status: AC | PRN
  Administered 2013-01-24: 100 mL via INTRAVENOUS

## 2013-01-24 MED ORDER — ONDANSETRON HCL 4 MG/2ML IJ SOLN
INTRAMUSCULAR | Status: AC
  Filled 2013-01-24: qty 2

## 2013-01-24 MED ORDER — ONDANSETRON HCL 4 MG/2ML IJ SOLN
4.0000 mg | Freq: Once | INTRAMUSCULAR | Status: AC
  Administered 2013-01-24: 4 mg via INTRAVENOUS

## 2013-01-24 NOTE — ED Notes (Signed)
Patient returned from xray.

## 2013-01-24 NOTE — ED Provider Notes (Signed)
CSN: [id number]     Arrival date & time 01/23/13  1936 History   First MD Initiated Contact with Patient 01/24/13 0018     Chief Complaint  Patient presents with  . Abdominal Pain   (Consider location/radiation/quality/duration/timing/severity/associated sxs/prior Treatment) Patient is a 47 y.o. male presenting with abdominal pain.  Abdominal Pain Pain location:  RUQ and RLQ Pain quality: sharp   Pain radiates to:  Back Pain severity:  Moderate Onset quality:  Unable to specify Duration:  1 week Timing:  Constant Progression:  Unchanged Chronicity:  Recurrent Context: not alcohol use, not awakening from sleep, not diet changes, not eating, not previous surgeries, not recent illness, not sick contacts, not suspicious food intake and not trauma   Relieved by:  Nothing Worsened by:  Nothing tried Ineffective treatments:  None tried Associated symptoms: cough, nausea and shortness of breath   Associated symptoms: no anorexia, no chest pain, no chills, no constipation, no diarrhea, no dysuria, no fatigue, no fever, no hematemesis, no hematochezia, no hematuria and no vomiting   Cough:    Cough characteristics:  Non-productive   Sputum characteristics:  Nondescript Shortness of breath:    Severity:  Moderate   Onset quality:  Unable to specify   Duration:  3 days   Timing:  Intermittent   Progression:  Waxing and waning   Past Medical History  Diagnosis Date  . HIV infection   . Recurrent genital HSV (herpes simplex virus) infection   . Pilonidal cyst   . Arthritis   . Sleep apnea     never tested-has s/s   Past Surgical History  Procedure Laterality Date  . Peg tube placement  2009  . Uvulectomy  2009  . Tonsillectomy    . Hernia repair      inguinal as a child  . Knee arthroscopy  12/11/2011    Procedure: ARTHROSCOPY KNEE;  Surgeon: Nestor Lewandowsky, MD;  Location: [facility];  Service: Orthopedics;  Laterality: Right;  . 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 Heart disease Father    History  Substance Use Topics  . Smoking status: Former Smoker    Quit date: 12/04/2003  . Smokeless tobacco: Never Used  . Alcohol Use: Yes     Comment: occ    Review of Systems  Constitutional: Negative for fever, chills, activity change, appetite change and fatigue.  HENT: Negative for congestion, facial swelling, rhinorrhea and trouble swallowing.   Eyes: Negative for photophobia and pain.  Respiratory: Positive for cough and shortness of breath. Negative for chest tightness.   Cardiovascular: Negative for chest pain and leg swelling.  Gastrointestinal: Positive for nausea and abdominal pain. Negative for vomiting, diarrhea, constipation, hematochezia, anorexia and hematemesis.  Endocrine: Negative for polydipsia and polyuria.  Genitourinary: Negative for dysuria, urgency, hematuria, decreased urine volume and difficulty urinating.  Musculoskeletal: Negative for back pain and gait problem.  Skin: Negative for color change, rash and wound.  Allergic/Immunologic: Negative for immunocompromised state.  Neurological: Negative for dizziness, facial asymmetry, speech difficulty, weakness, numbness and headaches.  Psychiatric/Behavioral: Negative for confusion, decreased concentration and agitation.    Allergies  Other; Shellfish allergy; and Penicillins  Home Medications  Current Outpatient Rx  Name  Route  Sig  Dispense  Refill  . atazanavir (REYATAZ) 300 MG capsule   Oral   Take 1 capsule (300 mg total) by mouth daily with breakfast.   30 capsule   5   . emtricitabine-tenofovir (TRUVADA) 200-300 MG per tablet   Oral    Take 1 tablet by mouth at bedtime.         Marland Kitchen HYDROcodone-acetaminophen (NORCO/VICODIN) 5-325 MG per tablet   Oral   Take 1 tablet by mouth daily as needed for pain.          . meloxicam (MOBIC) 15 MG tablet   Oral   Take 15 mg by mouth daily as needed for pain.          . naproxen sodium (ALEVE) 220 MG tablet   Oral   Take 440 mg by mouth daily as needed (pain).         . raltegravir (ISENTRESS) 400 MG tablet   Oral   Take 1 tablet (400 mg total) by mouth 2 (two) times daily.   60 tablet   6   . ritonavir (NORVIR) 100 MG TABS   Oral   Take 1 tablet (100 mg total) by mouth daily.   30 tablet   6   . valACYclovir (VALTREX) 500 MG tablet   Oral   Take 500 mg by mouth 2 (two) times daily.          BP 151/94  Pulse 70  Temp(Src) 98.7 F (37.1 C) (Oral)  Resp 15  SpO2 99% Physical Exam  Constitutional: He is oriented to person, place, and time. He appears well-developed and well-nourished. No distress.  HENT:  Head: Normocephalic and atraumatic.  Mouth/Throat: No oropharyngeal exudate.  Eyes: Pupils are equal, round, and reactive to light.  Neck: Normal range of motion. Neck supple.  Cardiovascular: Normal rate, regular rhythm and normal heart sounds.  Exam reveals no gallop and no friction rub.   No murmur heard. Pulmonary/Chest: Effort normal and breath sounds normal. No respiratory distress. He has no wheezes. He has no rales.  Abdominal: Soft. Bowel sounds are normal. He exhibits no distension and no mass. There is tenderness in the right upper quadrant and right lower quadrant. There is no rigidity, no rebound and no guarding.  Genitourinary: Right testis shows no mass, no swelling and no tenderness. Left testis shows no mass, no swelling and no tenderness. No penile tenderness.  Musculoskeletal: Normal range of motion. He exhibits no edema and no tenderness.  Neurological: He is alert and oriented to person, place, and time.  Skin: Skin is warm and dry.   Psychiatric: He has a normal mood and affect.    ED Course  Procedures (including critical care time) Labs Review Labs Reviewed  CBC WITH DIFFERENTIAL - Abnormal; Notable for the following:    WBC 11.4 (*)    MCH 34.7 (*)    All other components within normal limits  COMPREHENSIVE METABOLIC PANEL - Abnormal; Notable for the following:    Glucose, Bld 120 (*)    Albumin 3.3 (*)    Total Bilirubin 1.4 (*)    GFR calc non Af Amer 70 (*)    GFR calc Af Amer 81 (*)    All other components within normal limits  LIPASE, BLOOD  URINALYSIS, ROUTINE W REFLEX MICROSCOPIC   Imaging Review Dg Chest 2 View  01/24/2013   *RADIOLOGY REPORT*  Clinical Data: Nausea, weakness, cough, shortness of breath  for 5 days, HIV  CHEST - 2 VIEW  Comparison: None  Findings: Normal heart size, mediastinal contours, and pulmonary vascularity. Lungs clear. No pleural effusion or pneumothorax. Bones unremarkable.  IMPRESSION: No acute abnormalities.   Original Report Authenticated By: Ulyses Southward, M.D.   US Abdomen Complete  01/24/2013   *RADIOLOGY REPORT*  Clinical Data:  Right-sided abdominal pain.  Nausea and bloating. HIV.  COMPLETE ABDOMINAL ULTRASOUND  Comparison:  CT abdomen and pelvis 01/24/2013  Findings:  Gallbladder:  No gallstones, gallbladder wall thickening, or pericholecystic fluid.  Common bile duct:  Normal caliber.  Measures 4.2 mm diameter.  Liver:  Diffusely heterogeneous increased liver echotexture consistent with fatty infiltration is seen on previous CT.  No focal lesions are identified.  Limited color flow Doppler imaging demonstrates appropriate flow direction in the main portal and hepatic veins.  IVC:  Segmental visualization.  Visualized segments are unremarkable.  Pancreas:  Not visualized due to overlying bowel gas.  Spleen:  Spleen length measures 4.5 cm.  Normal parenchymal echotexture.  Right Kidney:  Right kidney measures 11.9 cm length.  No hydronephrosis.  Left Kidney:  Left kidney  measures 11.1 cm length. No hydronephrosis.  Abdominal aorta:  Not visualized due to overlying bowel gas.  IMPRESSION: Diffuse fatty infiltration of the liver.  Gallbladder bile ducts are normal.   Original Report Authenticated By: Burman Nieves, M.D.   Ct Abdomen Pelvis W Contrast  01/24/2013   *RADIOLOGY REPORT*  Clinical Data: Right lower quadrant and right upper quadrant abdominal pain.  Nausea, diarrhea, constipation.  Leukocytosis. History of HIV.  CT ABDOMEN AND PELVIS WITH CONTRAST  Technique:  Multidetector CT imaging of the abdomen and pelvis was performed following the standard protocol during bolus administration of intravenous contrast.  Contrast: OMNIPAQUE IOHEXOL 300 MG/ML  SOLN  Comparison: 07/25/2011  Findings: The lung bases are clear.  Mild diffuse fatty infiltration of the liver.  No focal liver lesions.  Gallbladder is contracted, likely physiologic.  The spleen, pancreas, adrenal glands, kidneys, abdominal aorta, inferior vena cava, and retroperitoneal lymph nodes are unremarkable.  The stomach and small bowel are normal.  Stool filled colon without distension.  No free air or free fluid in the abdomen.  Abdominal wall musculature appears intact.  Pelvis:  Prostate gland is not enlarged.  Bladder is decompressed. Scattered diverticula in the sigmoid colon without evidence of diverticulitis.  No colonic wall thickening.  No free or loculated pelvic fluid collections.  The appendix is not identified but no inflammatory changes are suggested in the right lower quadrant. Degenerative changes in the lumbar spine.  No destructive bone lesions are appreciated.  IMPRESSION: No acute process or inflammatory change demonstrated in the abdomen or pelvis.  Mild fatty infiltration of the liver.   Original Report Authenticated By: Burman Nieves, M.D.    MDM   Pt is a 47 y.o. male with Pmhx as above who presents with about 1 week of right sided ab pain w/ radiation to back with assoc nausea.   He has also had intermittent SOB, mild cough. No fever, vom, d/a, constipation, urinary symptoms. He states he has had similar symptoms in the past w/o known cause identified. W/u including CT ab pelvis, abdominal US which were unremarkable for cause of symptoms. Pt has mild leukocytosis, but labs otherwise unremarkable. I doubt acute infectious or surgical cause of pain & fele pt safe to f/u with PCP in next 1-2 days.  Return precautions given for new or worsening symptoms including  worsening pain, fever, inability to tolerate PO.   1. Abdominal pain of unknown etiology         Shanna Cisco, MD 01/24/13 [phone]

## 2013-01-24 NOTE — ED Notes (Signed)
Patient transported to x-ray. ?

## 2013-01-24 NOTE — ED Notes (Signed)
Patient transported to CT 

## 2013-02-02 ENCOUNTER — Encounter: Payer: Self-pay | Admitting: Internal Medicine

## 2013-02-02 ENCOUNTER — Ambulatory Visit (INDEPENDENT_AMBULATORY_CARE_PROVIDER_SITE_OTHER): Payer: Medicare Other | Admitting: Internal Medicine

## 2013-02-02 VITALS — BP 144/90 | HR 101 | Temp 98.1°F | Ht 72.0 in | Wt 360.0 lb

## 2013-02-02 DIAGNOSIS — Z23 Encounter for immunization: Secondary | ICD-10-CM

## 2013-02-02 DIAGNOSIS — Z0000 Encounter for general adult medical examination without abnormal findings: Secondary | ICD-10-CM

## 2013-02-02 DIAGNOSIS — B20 Human immunodeficiency virus [HIV] disease: Secondary | ICD-10-CM

## 2013-02-02 MED ORDER — DOLUTEGRAVIR SODIUM 50 MG PO TABS: 50.0000 mg | ORAL_TABLET | Freq: Every day | ORAL | Status: DC

## 2013-02-02 NOTE — Assessment & Plan Note (Addendum)
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 His compliance has greatly improved. I am going to change his Isentress to tivicay for ease of dosing. Also he was seen by the pharmacist who changed his medications to all at the same time. I will check his hepatitis A and B. Antibodies next visit to see if he is immune to he does think he got shots when he was at [facility] for [facility] years.

## 2013-02-02 NOTE — Progress Notes (Signed)
  [facility] - Pharmacist    HPI: Vincent Young is a 47 y.o. male here for routine follow-up.  I was asked to see him to counsel on dolutegravir.  Allergies: Allergies  Allergen Reactions  . Other Other (See Comments)    Tree nuts cause throat swelling  . Shellfish Allergy Swelling  . Penicillins Rash    Vitals: Temp: 98.1 F (36.7 C) (09/16 0955) Temp src: Oral (09/16 0955) BP: 144/90 mmHg (09/16 0955) Pulse Rate: 101 (09/16 0955)  Past Medical History: Past Medical History  Diagnosis Date  . HIV infection   . Recurrent genital HSV (herpes simplex virus) infection   . Pilonidal cyst   . Arthritis   . Sleep apnea     never tested-has s/s    Social History: History   Social History  . Marital Status: Single    Spouse Name: N/A    Number of Children: N/A  . Years of Education: N/A   Social History Main Topics  . Smoking status: Former Smoker    Quit date: 12/04/2003  . Smokeless tobacco: Never Used     Comment: pt. no longer smokes  . Alcohol Use: Yes     Comment: occasional  . Drug Use: Yes    Special: Marijuana     Comment: rarely  . Sexual Activity: Yes    Partners: Female     Comment: pt. declined condoms   Other Topics Concern  . None   Social History Narrative  . None    Current Regimen: Reyataz 300mg  QAM Norvir 100mg  QPM Truvada 1 tab QPM Raltegravir 400mg  BID Valtrex 500mg  QPM  Labs: HIV 1 RNA Quant (copies/mL)  Date Value  01/19/2013 <20   10/22/2012 <20   03/03/2012 28*     CD4 T Cell Abs (/uL)  Date Value  01/19/2013 570   10/22/2012 430   03/03/2012 610      Hep B S Ab (no units)  Date Value  10/18/2009 POS*     Hepatitis B Surface Ag (no units)  Date Value  10/18/2009 NEG      HCV Ab (no units)  Date Value  10/18/2009 NEG     CrCl: Estimated Creatinine Clearance: 118.8 ml/min (by C-G formula based on Cr of 1.19).  Lipids:    Component Value Date/Time   CHOL 179 03/03/2012 1030   TRIG  80 03/03/2012 1030   HDL 40 03/03/2012 1030   CHOLHDL 4.5 03/03/2012 1030   VLDL 16 03/03/2012 1030   LDLCALC 123* 03/03/2012 1030    Assessment: HIV - He has been taking his atazanavir in the morning and his ritonavir in the evenings.  We discussed the importance of taking these medications together.  He had some misunderstanding that his medications needed to be separated, and that some must be taken in the morning.  Morning medication administration is not convenient for him due to his work/sleep schedule.  He suggested dinner as a consistent meal that he eats, and he usually takes medication at that time.  Recommendations: Take Atazanavir/ritonavir, Truvada, and Dolutegravir with dinner.  His AVS was updated with these instructions. Adherence counseling performed.  Sallee Provencal, Pharm.D., BCPS, AAHIVP Clinical Infectious Disease Pharmacist [facility] 02/02/2013, 12:30 PM

## 2013-02-02 NOTE — Progress Notes (Signed)
  Subjective:    Patient ID: Vincent Young, male    DOB: Jun 08, 1965, 47 y.o.   MRN: [id number]  HPI He comes in for routine followup. He continues on Isentress, Reyataz, Norvir and Truvada. He feels well and since his last visit he endorses complete compliance with his medications, not missing any doses. His viral load is undetectable and his CD4 count is in the normal range. He has no complaints. No weight loss, no diarrhea. He does take his medications daily but takes his Norvir separate from his Reyataz.   Review of Systems  Constitutional: Negative for fatigue and unexpected weight change.  HENT: Negative for sore throat and trouble swallowing.   Eyes: Negative for visual disturbance.  Respiratory: Negative for cough and shortness of breath.   Cardiovascular: Negative for chest pain.  Gastrointestinal: Negative for nausea, abdominal pain and diarrhea.  Musculoskeletal: Negative for myalgias and arthralgias.  Skin: Negative for rash.  Neurological: Negative for dizziness, light-headedness and headaches.  Hematological: Negative for adenopathy.  Psychiatric/Behavioral: Negative for dysphoric mood.       Objective:   Physical Exam  Constitutional: He is oriented to person, place, and time. He appears well-developed and well-nourished. No distress.  Morbidly obese  HENT:  Mouth/Throat: No oropharyngeal exudate.  Eyes: Right eye exhibits no discharge. Left eye exhibits no discharge. No scleral icterus.  Cardiovascular: Normal rate, regular rhythm and normal heart sounds.   No murmur heard. Pulmonary/Chest: Effort normal and breath sounds normal. No respiratory distress. He has no wheezes.  Lymphadenopathy:    He has no cervical adenopathy.  Neurological: He is alert and oriented to person, place, and time.  Skin: Skin is warm and dry. No rash noted.  Psychiatric: He has a normal mood and affect. His behavior is normal.          Assessment & Plan:

## 2013-02-02 NOTE — Assessment & Plan Note (Signed)
Flu shot today 

## 2013-02-09 ENCOUNTER — Encounter: Payer: Self-pay | Admitting: *Deleted

## 2013-02-09 ENCOUNTER — Ambulatory Visit (INDEPENDENT_AMBULATORY_CARE_PROVIDER_SITE_OTHER): Payer: Self-pay | Admitting: *Deleted

## 2013-02-09 ENCOUNTER — Other Ambulatory Visit: Payer: Self-pay | Admitting: *Deleted

## 2013-02-09 VITALS — BP 149/101 | HR 86 | Temp 98.3°F | Resp 16 | Wt 361.5 lb

## 2013-02-09 DIAGNOSIS — I8290 Acute embolism and thrombosis of unspecified vein: Secondary | ICD-10-CM

## 2013-02-09 DIAGNOSIS — Z21 Asymptomatic human immunodeficiency virus [HIV] infection status: Secondary | ICD-10-CM

## 2013-02-09 DIAGNOSIS — B20 Human immunodeficiency virus [HIV] disease: Secondary | ICD-10-CM

## 2013-02-09 NOTE — Progress Notes (Signed)
  Subjective:    Patient ID: Vincent Young, male    DOB: 31-Mar-1966, 47 y.o.   MRN: [id number]  HPI    Review of Systems     Objective:   Physical Exam  Constitutional: He is oriented to person, place, and time. He appears well-developed and well-nourished.  HENT:  Head: Normocephalic.  Nose: Nose normal.  Eyes: Conjunctivae are normal.  Neck: Normal range of motion. Neck supple.  Cardiovascular: Normal rate, regular rhythm and normal heart sounds.   Pulmonary/Chest: Effort normal and breath sounds normal.  Abdominal: Soft. Bowel sounds are normal.  Musculoskeletal: Normal range of motion. He exhibits edema. He exhibits no tenderness.  Trace edema to RLE   Neurological: He is alert and oriented to person, place, and time.  Skin: Skin is warm and dry.  Psychiatric: He has a normal mood and affect. His behavior is normal. Judgment normal.          Assessment & Plan:  Vincent Young is here for pre-entry into study A5331. Only change is to his ARV's. He has stopped RTG and started DTG. He started to have symtpoms of sinus congestion and itchy throat as of yesterday that is due to allergies. Fasting labs were drawn and vital signs taken with elevation in BP. This is known and states it is due to his weight gain and poor quality of sleep. States he has taken a "water pill" in the past. Will obtain BART this afternoon from [facility]. He received $150 for travel, BART, and visit. Next appointment scheduled for Tuesday, February 16, 2013 @ 8:30am. Tacey Heap RN

## 2013-02-10 ENCOUNTER — Ambulatory Visit (HOSPITAL_COMMUNITY)
Admission: RE | Admit: 2013-02-10 | Discharge: 2013-02-10 | Disposition: A | Discharge status: Home / Self Care | Payer: Medicare Other | Source: Ambulatory Visit | Attending: Infectious Disease | Admitting: Infectious Disease

## 2013-02-10 DIAGNOSIS — M79609 Pain in unspecified limb: Secondary | ICD-10-CM

## 2013-02-10 DIAGNOSIS — I8290 Acute embolism and thrombosis of unspecified vein: Secondary | ICD-10-CM

## 2013-02-10 NOTE — Progress Notes (Signed)
*  PRELIMINARY RESULTS* Vascular Ultrasound Right upper extremity venous duplex has been completed.  Preliminary findings: no evidence of Deep or Superficial vein thrombosis.  Attempted call report to Dr. Daiva Eves. Left voice message with results.   Farrel Demark, RDMS, RVT  02/10/2013, 9:48 AM

## 2013-02-12 ENCOUNTER — Other Ambulatory Visit: Payer: Self-pay | Admitting: Internal Medicine

## 2013-02-16 ENCOUNTER — Ambulatory Visit (INDEPENDENT_AMBULATORY_CARE_PROVIDER_SITE_OTHER): Payer: Self-pay | Admitting: *Deleted

## 2013-02-16 ENCOUNTER — Other Ambulatory Visit: Payer: Medicare Other

## 2013-02-16 VITALS — BP 145/96 | HR 76 | Temp 98.1°F | Resp 16 | Wt 355.0 lb

## 2013-02-16 DIAGNOSIS — Z21 Asymptomatic human immunodeficiency virus [HIV] infection status: Secondary | ICD-10-CM

## 2013-02-16 DIAGNOSIS — B20 Human immunodeficiency virus [HIV] disease: Secondary | ICD-10-CM

## 2013-02-16 LAB — CD4/CD8 (T-HELPER/T-SUPPRESSOR CELL)
CD4%: 18
CD4: 576
CD8: 2083

## 2013-02-16 LAB — HIV-1 RNA QUANT-NO REFLEX-BLD: HIV-1 RNA Viral Load: <40

## 2013-02-23 ENCOUNTER — Encounter: Payer: Self-pay | Admitting: *Deleted

## 2013-02-23 NOTE — Progress Notes (Signed)
Vincent Young is here for Entry into [id number]. BART was completed on 02/09/2013 and clot was ruled out on 02/10/2013. CRFs received and faxed. Confirmed eligibility. Gi issues have resolved and etiology is still unknown. He has seen a gastroenterologist on 02/08/2013 and has a colonoscopy scheduled in January since this is not an emergent situation. No new findings since las study visit. He denies any new problems, findings, or symptoms. Fasting labs were drawn. No changes to medications. Study medications were dispensed and discussed how medication should be taken. He was only dispensed a 30 day supply and will receive the rest (90 day supply) at next study visit. He received $50 gift card for study visit. Next appointment scheduled for Monday, March 01, 2013 at 8am Tacey Heap RN

## 2013-03-01 ENCOUNTER — Ambulatory Visit (INDEPENDENT_AMBULATORY_CARE_PROVIDER_SITE_OTHER): Payer: Self-pay | Admitting: *Deleted

## 2013-03-01 VITALS — BP 130/100 | HR 88 | Temp 98.1°F | Resp 16 | Wt 355.8 lb

## 2013-03-01 DIAGNOSIS — B20 Human immunodeficiency virus [HIV] disease: Secondary | ICD-10-CM

## 2013-03-01 NOTE — Progress Notes (Signed)
Patient here for his 2 week study visit. He continues to take his aspirin/placebo without any problems. He continues to have chronic knee pain and knows not to take the mobic while he is on study. He has asked about using ginseng while he is on study and I told him that it is considered a precautionary drug and that we would try to find out more about it before he starts it. He will return December 29th for the next study visit.

## 2013-03-12 ENCOUNTER — Encounter: Payer: Self-pay | Admitting: Internal Medicine

## 2013-03-27 ENCOUNTER — Other Ambulatory Visit: Payer: Self-pay | Admitting: Internal Medicine

## 2013-05-07 ENCOUNTER — Encounter: Payer: Self-pay | Admitting: *Deleted

## 2013-05-07 ENCOUNTER — Ambulatory Visit (INDEPENDENT_AMBULATORY_CARE_PROVIDER_SITE_OTHER): Payer: Self-pay | Admitting: *Deleted

## 2013-05-07 VITALS — BP 148/90 | HR 89 | Temp 98.5°F | Resp 17 | Wt 346.2 lb

## 2013-05-07 DIAGNOSIS — Z21 Asymptomatic human immunodeficiency virus [HIV] infection status: Secondary | ICD-10-CM

## 2013-05-07 DIAGNOSIS — B20 Human immunodeficiency virus [HIV] disease: Secondary | ICD-10-CM

## 2013-05-07 NOTE — Progress Notes (Signed)
Vincent Young is here for A5331 study week11. He has continued to intentionally lose weight and is excited. He denies any new problems, signs, or symptoms. He stopped study medication on 04/09/2013 and he returned all left over pills. Pill count: 52 of ASA 100mg  placebo and 50 of ASA 300mg  placebo. He was unblinded sue to possible blood in stools. He was evaluated by Dr. Ihor Dow at [facility] at [facility] and have faxed in a release of information to obtain office notes about the above mentioned. Fasting labs obtained and his blood pressure elevated. He received $50 gift card for visit.  Next appt is Tuesday, 12/23.

## 2013-05-11 ENCOUNTER — Ambulatory Visit (INDEPENDENT_AMBULATORY_CARE_PROVIDER_SITE_OTHER): Payer: Self-pay | Admitting: *Deleted

## 2013-05-11 VITALS — BP 138/92 | HR 80 | Temp 98.2°F | Resp 18 | Wt 345.0 lb

## 2013-05-11 DIAGNOSIS — B20 Human immunodeficiency virus [HIV] disease: Secondary | ICD-10-CM

## 2013-05-11 DIAGNOSIS — Z21 Asymptomatic human immunodeficiency virus [HIV] infection status: Secondary | ICD-10-CM

## 2013-05-12 NOTE — Progress Notes (Signed)
Vincent Young is here for aspirin study A5331, week 12. He had developed abd pain, heartburn and possible blood in stools and ASA/placebo was stopped immediately and he was seen by his PCP. He was unblinded and found to have taken a placebo for asa therefore event was not related to study drug. He has will continue on study for observational purposes. He started Nexium for possible upper GI ulcers; FOBT was negative. MD still recommends endoscopy. Vincent Young has a colonoscopy scheduled in January. Currently his abd pain/cramping has stopped.  He has had intentional weight loss since 01/2013 and plans to join a gym beginning next year. Fasting labs obtained. He received $50 gift card for study. Next appt in January. Tacey Heap RN

## 2013-05-27 ENCOUNTER — Other Ambulatory Visit: Payer: Self-pay | Admitting: Internal Medicine

## 2013-06-02 ENCOUNTER — Encounter: Payer: Self-pay | Admitting: Internal Medicine

## 2013-06-02 LAB — CD4/CD8 (T-HELPER/T-SUPPRESSOR CELL)
CD4%: 21.6
CD4: 691
CD8 % Suppressor T Cell: 62.3
CD8: 1994

## 2013-06-02 LAB — HIV-1 RNA QUANT-NO REFLEX-BLD: HIV-1 RNA Viral Load: <40

## 2013-06-03 ENCOUNTER — Ambulatory Visit: Payer: Medicare Other | Admitting: Internal Medicine

## 2013-06-08 ENCOUNTER — Ambulatory Visit (INDEPENDENT_AMBULATORY_CARE_PROVIDER_SITE_OTHER): Payer: Self-pay | Admitting: *Deleted

## 2013-06-08 VITALS — BP 128/96 | HR 82 | Temp 98.3°F | Resp 16 | Wt 349.0 lb

## 2013-06-08 DIAGNOSIS — Z21 Asymptomatic human immunodeficiency virus [HIV] infection status: Secondary | ICD-10-CM

## 2013-06-08 DIAGNOSIS — B20 Human immunodeficiency virus [HIV] disease: Secondary | ICD-10-CM

## 2013-06-08 NOTE — Progress Notes (Signed)
Patient here for his final visit on study A5331. He denies any new problems or concerns. He says his reflux issues have resolved.  He was unblinded to placebo during the study for abdominal cramping, heartburn, dark stools.

## 2013-06-09 ENCOUNTER — Emergency Department (HOSPITAL_COMMUNITY): Payer: Medicare Other

## 2013-06-09 ENCOUNTER — Emergency Department (HOSPITAL_COMMUNITY)
Admission: EM | Admit: 2013-06-09 | Discharge: 2013-06-09 | Disposition: A | Discharge status: Home / Self Care | Payer: Medicare Other | Attending: Emergency Medicine | Admitting: Emergency Medicine

## 2013-06-09 ENCOUNTER — Encounter (HOSPITAL_COMMUNITY): Payer: Self-pay | Admitting: Radiology

## 2013-06-09 DIAGNOSIS — Z21 Asymptomatic human immunodeficiency virus [HIV] infection status: Secondary | ICD-10-CM | POA: Insufficient documentation

## 2013-06-09 DIAGNOSIS — Y92410 Unspecified street and highway as the place of occurrence of the external cause: Secondary | ICD-10-CM | POA: Insufficient documentation

## 2013-06-09 DIAGNOSIS — Z87891 Personal history of nicotine dependence: Secondary | ICD-10-CM | POA: Insufficient documentation

## 2013-06-09 DIAGNOSIS — S0993XA Unspecified injury of face, initial encounter: Secondary | ICD-10-CM | POA: Insufficient documentation

## 2013-06-09 DIAGNOSIS — Z872 Personal history of diseases of the skin and subcutaneous tissue: Secondary | ICD-10-CM | POA: Insufficient documentation

## 2013-06-09 DIAGNOSIS — Y9389 Activity, other specified: Secondary | ICD-10-CM | POA: Insufficient documentation

## 2013-06-09 DIAGNOSIS — Z8619 Personal history of other infectious and parasitic diseases: Secondary | ICD-10-CM | POA: Insufficient documentation

## 2013-06-09 DIAGNOSIS — Z880 Allergy status to penicillin: Secondary | ICD-10-CM | POA: Insufficient documentation

## 2013-06-09 DIAGNOSIS — Z79899 Other long term (current) drug therapy: Secondary | ICD-10-CM | POA: Insufficient documentation

## 2013-06-09 DIAGNOSIS — S79929A Unspecified injury of unspecified thigh, initial encounter: Secondary | ICD-10-CM

## 2013-06-09 DIAGNOSIS — S6990XA Unspecified injury of unspecified wrist, hand and finger(s), initial encounter: Secondary | ICD-10-CM | POA: Insufficient documentation

## 2013-06-09 DIAGNOSIS — S4980XA Other specified injuries of shoulder and upper arm, unspecified arm, initial encounter: Secondary | ICD-10-CM | POA: Insufficient documentation

## 2013-06-09 DIAGNOSIS — M129 Arthropathy, unspecified: Secondary | ICD-10-CM | POA: Insufficient documentation

## 2013-06-09 DIAGNOSIS — S46909A Unspecified injury of unspecified muscle, fascia and tendon at shoulder and upper arm level, unspecified arm, initial encounter: Secondary | ICD-10-CM | POA: Insufficient documentation

## 2013-06-09 DIAGNOSIS — S6980XA Other specified injuries of unspecified wrist, hand and finger(s), initial encounter: Secondary | ICD-10-CM | POA: Insufficient documentation

## 2013-06-09 DIAGNOSIS — S92919A Unspecified fracture of unspecified toe(s), initial encounter for closed fracture: Secondary | ICD-10-CM | POA: Insufficient documentation

## 2013-06-09 DIAGNOSIS — S79919A Unspecified injury of unspecified hip, initial encounter: Secondary | ICD-10-CM | POA: Insufficient documentation

## 2013-06-09 DIAGNOSIS — S92401A Displaced unspecified fracture of right great toe, initial encounter for closed fracture: Secondary | ICD-10-CM

## 2013-06-09 DIAGNOSIS — S199XXA Unspecified injury of neck, initial encounter: Secondary | ICD-10-CM

## 2013-06-09 DIAGNOSIS — S3981XA Other specified injuries of abdomen, initial encounter: Secondary | ICD-10-CM | POA: Insufficient documentation

## 2013-06-09 HISTORY — DX: Essential (primary) hypertension: I10

## 2013-06-09 LAB — POCT I-STAT, CHEM 8
BUN: 14 mg/dL (ref 6–23)
Calcium, Ion: 1.1 mmol/L — ABNORMAL LOW (ref 1.12–1.23)
Chloride: 104 mEq/L (ref 96–112)
Creatinine, Ser: 1.2 mg/dL (ref 0.50–1.35)
Glucose, Bld: 104 mg/dL — ABNORMAL HIGH (ref 70–99)
HCT: 46 % (ref 39.0–52.0)
Hemoglobin: 15.6 g/dL (ref 13.0–17.0)
Potassium: 4.2 mEq/L (ref 3.7–5.3)
Sodium: 139 mEq/L (ref 137–147)
TCO2: 23 mmol/L (ref 0–100)

## 2013-06-09 MED ORDER — SODIUM CHLORIDE 0.9 % IV SOLN
INTRAVENOUS | Status: DC
  Administered 2013-06-09: 09:00:00 via INTRAVENOUS

## 2013-06-09 MED ORDER — OXYCODONE-ACETAMINOPHEN 5-325 MG PO TABS
2.0000 | ORAL_TABLET | Freq: Once | ORAL | Status: AC
  Administered 2013-06-09: 2 via ORAL
  Filled 2013-06-09: qty 2

## 2013-06-09 MED ORDER — OXYCODONE-ACETAMINOPHEN 5-325 MG PO TABS: ORAL_TABLET | ORAL | Status: DC

## 2013-06-09 MED ORDER — METHOCARBAMOL 500 MG PO TABS: 1000.0000 mg | ORAL_TABLET | Freq: Four times a day (QID) | ORAL | Status: DC | PRN

## 2013-06-09 MED ORDER — IOHEXOL 300 MG/ML  SOLN
125.0000 mL | Freq: Once | INTRAMUSCULAR | Status: AC | PRN
  Administered 2013-06-09: 125 mL via INTRAVENOUS
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
## 2013-06-09 NOTE — ED Notes (Signed)
Patient transported to CT 
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
## 2013-06-09 NOTE — Discharge Instructions (Signed)
°Emergency Department Resource Guide °1) Find a Doctor and Pay Out of Pocket °Although you won't have to find out who is covered by your insurance plan, it is a good idea to ask around and get recommendations. You will then need to call the office and see if the doctor you have chosen will accept you as a new patient and what types of options they offer for patients who are self-pay. Some doctors offer discounts or will set up payment plans for their patients who do not have insurance, but you will need to ask so you aren't surprised when you get to your appointment. ° °2) Contact Your Local Health Department °Not all health departments have doctors that can see patients for sick visits, but many do, so it is worth a call to see if yours does. If you don't know where your local health department is, you can check in your phone book. The CDC also has a tool to help you locate your state's health department, and many state websites also have listings of all of their local health departments. ° °3) Find a Walk-in Clinic °If your illness is not likely to be very severe or complicated, you may want to try a walk in clinic. These are popping up all over the country in pharmacies, drugstores, and shopping centers. They're usually staffed by nurse practitioners or physician assistants that have been trained to treat common illnesses and complaints. They're usually fairly quick and inexpensive. However, if you have serious medical issues or chronic medical problems, these are probably not your best option. ° °No Primary Care Doctor: °- Call [facility] at  832-8000 - they can help you locate a primary care doctor that  accepts your insurance, provides certain services, etc. °- Physician Referral Service- 1-800-533-3463 ° °Chronic Pain Problems: °Organization         Address  Phone   Notes  °[facility]  (336) 297-2271 Patients need to be referred by their primary care doctor.  ° °Medication  Assistance: °Organization         Address  Phone   Notes  °[place] [facility] [address] °[place], [place] [address] (336) 641-8030 --Must be a resident of [place] °-- Must have NO insurance coverage whatsoever (no Medicaid/ Medicare, etc.) °-- The pt. MUST have a primary care doctor that directs their care regularly and follows them in the community °  °[facility]  (866) 331-1348   °[employer]  (888) 892-1162   ° °Agencies that provide inexpensive medical care: °Organization         Address  Phone   Notes  °[facility]  (336) 832-8035   °[facility]    (336) 832-7272   °[facility] [address] °[place], [place] [address] (336) 832-4777   °[facility] [address], °[place] (336) 271-4999   °[facility]    (336) 373-0678   °[facility]    (336) 272-1050   °[facility] ° [address], [place] Phone:  (336) 832-4444, Fax:  (336) 832-4440 Hours of Operation:  9 am - 6 pm, M-F.  Also accepts Medicaid/Medicare and self-pay.  °[facility] ° [address], [place] Phone: (336) 832-3150, Fax: (336) 832-3151. Hours of Operation:  8:30 am - 5:30 pm, M-F.  Also accepts Medicaid and self-pay.  °[facility] [place] [address], [place] Phone: (336) 878-6027   °[facility] [address], [place], [place] (336)723-1848, Ext. 123 Mondays & Thursdays: 7-9 AM.  First 15 patients are seen on a first come, first serve basis. °  ° °Medicaid-accepting [place] Providers: ° °Organization         Address  Phone   Notes  °[facility] [address], [place] (336) 641-2100 Also accepts self-pay patients.  °[facility] [address], [place] ° (336) 856-9996   °[facility] [address] [place] [address], [place]  (336) 288-8857   °[facility] [address] [place] [address], [place] (336) 299-7000   °Veita Bland [address], [place]  ° (336) 373-1557 Only accepts [place] Access Medicaid patients after they have their name applied to their card.  ° °Self-Pay (no insurance) in [place]: ° °Organization         Address  Phone   Notes  °Sickle Cell Patients, [facility] [address], [place] (336) 832-1970   °[facility] [address], [place] (336) 832-4400   °[facility] ° [address], [place] (336) 992-4800   °[facility]/Dr. Osei-Bonsu ° [address] [place] [address], [place] or [address], [place] (336) 841-8500 Phone number for both [place] and [place] locations is the same.  °[facility] [address], [place] (336) 299-0000   °[facility] [address] [place] [address], [place] or [address] (336) 852-7530 °(336) 878-2260   °[facility] [address], [place] (336) 350-1642, phone; (336) 294-5005, fax Sees patients 1st and 3rd Saturday of every month.  Must not qualify for public or private insurance (i.e. Medicaid, Medicare, [place] Health Choice, Veterans' Benefits) • Household income should be no more than 200% of the poverty level •The clinic cannot treat you if you are pregnant or think you are pregnant • Sexually transmitted diseases are not treated at the clinic.  ° ° °Dental Care: °Organization         Address  Phone  Notes  °[place] [facility] [address], [place] (336) 641-6152 Accepts children up to age 21 who are enrolled in Medicaid or [place] Health Choice; pregnant women with a Medicaid card; and children who have applied for Medicaid or [place] Health Choice, but were declined, whose parents can pay a reduced fee at time of service.  °[place]  [facility] [place]  [address], [place] (336) 641-7733 Accepts children up to age 21 who are enrolled in Medicaid or [place] Health Choice; pregnant women with a Medicaid card; and children who have applied for Medicaid or [place] Health Choice, but were declined, whose parents can pay a reduced fee at time of service.  °[facility] ° [address], [place] (336) 641-4533 Patients are seen by appointment only. Walk-ins are not accepted. [facility] will see patients 18 years of age and older. °Monday - Tuesday (8am-5pm) °Most Wednesdays (8:30-5pm) °$30 per visit, cash only  °[facility] ° [address], [place] (336) 641-4533 Patients are seen by appointment only. Walk-ins are not accepted. [facility] will see patients 18 years of age and older. °One   Wednesday Evening (Monthly: Volunteer Based).  $30 per visit, cash only  °[facility]  (919) 537-3737 for adults; Children under age 4, call Graduate Pediatric Dentistry at ([phone]) [phone]. Children aged 4-14, please call (919) [phone] to request a pediatric application. ° Dental services are provided in all areas of dental care including fillings, crowns and bridges, complete and partial dentures, implants, gum treatment, root canals, and extractions. Preventive care is also provided. Treatment is provided to both adults and children. °Patients are selected via a lottery and there is often a waiting list. °  °[facility] [address], °[place] ° (336) 763-8833 [email or web address] °  °[facility] [address], [place], [place] (336)723-1848, Ext. 123 Second and Fourth Thursday of each month, opens at 6:30 AM; Clinic ends at 9 AM.  Patients are seen on a first-come first-served basis, and a limited number are seen during each clinic.  ° °[facility] ° [address], [place], [place] (336) 723-7904    Eligibility Requirements °You must have lived in [place], [place], or [place] counties for at least the last three months. °  You cannot be eligible for state or federal sponsored healthcare insurance, including Veterans Administration, Medicaid, or Medicare. °  You generally cannot be eligible for healthcare insurance through your employer.  °  How to apply: °Eligibility screenings are held every Tuesday and Wednesday afternoon from 1:00 pm until 4:00 pm. You do not need an appointment for the interview!  °[facility] [address], [place], [place] 336-631-2330   °[facility]  336-342-8273   °[facility]  336-703-3100   °[facility]  336-570-6415   ° °Behavioral Health Resources in the Community: °Intensive Outpatient Programs °Organization         Address  Phone  Notes  °[facility] [address], [place], [place] 336-878-6098   °[facility] [address], [place], [place] 336-832-9800   °[facility] [address], [place], [place] ° 336-882-2125   °[facility] [address],  °[place], [place] 1-800-853-5163 or 336-641-4981   °Substance Abuse Resources °Organization         Address  Phone  Notes  °[facility]  336-882-2125   °[facility]  336-784-9470   °[facility]  336-285-9073   °[facility]  336-845-3988   °Residential & Outpatient Substance Abuse Program  1-800-659-3381   °Psychological Services °Organization         Address  Phone  Notes  ° Health  336- 832-9600   °[facility]  336- 378-7881   °[facility] [address], [place] 1-800-853-5163 or 336-641-4981   ° °Mobile Crisis Teams °Organization         Address  Phone  Notes  °[facility], Mobile Crisis Care Unit  1-877-626-1772   °Assertive °[facility] ° [address]  [place], [place] 336-834-9664   °Sharon DeEsch [address] °[place] 336-554-5454   ° °Self-Help/Support Groups °Organization         Address  Phone             Notes  °[facility] [place] - variety of support groups  336- 373-1402 Call for more information  °Narcotics Anonymous (NA), [facility] [address], °[place]  2 meetings at this location  ° °  Residential Treatment Programs Organization         Address  Phone  Notes  [facility] [address],    [place]  1-509-447-8631   [facility]  [address] [place] [address], [place], [place]   [place], [place] (814)747-8236 Admissions: 8am-3pm M-F  [facility] [address],    [place], [place] 696-295-2841   [facility] [address] [place], [place], [place]   [facility] [address]  [place], [place]   [facility] Intensive Outpatient [address], [place], [place]   [facility] ([place] [address]  [place], [place] 1-317-161-8717 or (819)366-8754   [facility] [address], [place], [place] Accepts Medicaid  [facility] [address],  [place] 1-815-401-2608 Substance Abuse/Addiction Treatment   [facility] Organization         Address  Phone  Notes  [facility]  251-091-7384   Domenic Schwab, PhD [address] [place], [place]   (620)634-3735 or 281-381-3815   [facility] [place], [place] [phone]   [facility] [address] [place], [place] (850)070-0918 Insurance/Medicaid/sponsorship through [facility] [address]                                    [place], [place] [phone] [place] [address], [place] [phone]    Dr. Adele Schilder  (813) [phone]   [facility] Dept. 1) [address], [place] 2) [place] 3)  [address], [place] (774) [phone] (609) [phone]  726-495-9044   [place] 778-345-9825 or (865)585-8209 (After Hours)      Take the prescriptions as directed.  Apply moist heat or ice to the area(s) of discomfort, for 15 minutes at a time, several times per day for the next few days.  Do not fall asleep on a heating or ice pack.  Buddy tape your toes for comfort until you are seen in follow up. Call your regular medical doctor today to schedule a follow up appointment this week.  Return to the Emergency Department immediately if worsening.

## 2013-06-24 ENCOUNTER — Encounter: Payer: Self-pay | Admitting: Internal Medicine

## 2013-06-24 LAB — CD4/CD8 (T-HELPER/T-SUPPRESSOR CELL)
CD4%: 21.8
CD4: 480
CD8 % Suppressor T Cell: 62.6
CD8: 1377

## 2013-06-24 LAB — HIV-1 RNA QUANT-NO REFLEX-BLD: HIV-1 RNA Viral Load: <40

## 2013-06-26 ENCOUNTER — Other Ambulatory Visit: Payer: Self-pay | Admitting: Internal Medicine

## 2013-07-25 ENCOUNTER — Other Ambulatory Visit: Payer: Self-pay | Admitting: Internal Medicine

## 2013-07-25 DIAGNOSIS — B20 Human immunodeficiency virus [HIV] disease: Secondary | ICD-10-CM

## 2013-10-05 ENCOUNTER — Other Ambulatory Visit (HOSPITAL_COMMUNITY)
Admission: RE | Admit: 2013-10-05 | Discharge: 2013-10-05 | Disposition: A | Discharge status: Home / Self Care | Payer: Medicare Other | Source: Ambulatory Visit | Attending: Infectious Disease | Admitting: Infectious Disease

## 2013-10-05 ENCOUNTER — Other Ambulatory Visit: Payer: Medicare Other

## 2013-10-05 DIAGNOSIS — Z113 Encounter for screening for infections with a predominantly sexual mode of transmission: Secondary | ICD-10-CM

## 2013-10-05 DIAGNOSIS — B20 Human immunodeficiency virus [HIV] disease: Secondary | ICD-10-CM

## 2013-10-05 DIAGNOSIS — Z79899 Other long term (current) drug therapy: Secondary | ICD-10-CM

## 2013-10-05 LAB — COMPLETE METABOLIC PANEL WITH GFR
ALT: 16 U/L (ref 0–53)
AST: 17 U/L (ref 0–37)
Albumin: 3.6 g/dL (ref 3.5–5.2)
Alkaline Phosphatase: 111 U/L (ref 39–117)
BUN: 12 mg/dL (ref 6–23)
CO2: 25 mEq/L (ref 19–32)
Calcium: 8.7 mg/dL (ref 8.4–10.5)
Chloride: 104 mEq/L (ref 96–112)
Creat: 1.13 mg/dL (ref 0.50–1.35)
GFR, Est African American: 87 mL/min
GFR, Est Non African American: 75 mL/min
Glucose, Bld: 107 mg/dL — ABNORMAL HIGH (ref 70–99)
Potassium: 4.5 mEq/L (ref 3.5–5.3)
Sodium: 137 mEq/L (ref 135–145)
Total Bilirubin: 2.4 mg/dL — ABNORMAL HIGH (ref 0.2–1.2)
Total Protein: 6.8 g/dL (ref 6.0–8.3)

## 2013-10-05 LAB — LIPID PANEL
Cholesterol: 145 mg/dL (ref 0–200)
HDL: 34 mg/dL — ABNORMAL LOW (ref >39)
LDL Cholesterol: 90 mg/dL (ref 0–99)
Total CHOL/HDL Ratio: 4.3 Ratio
Triglycerides: 105 mg/dL (ref <150)
VLDL: 21 mg/dL (ref 0–40)

## 2013-10-06 LAB — RPR

## 2013-10-06 LAB — HEPATITIS B SURFACE ANTIBODY,QUALITATIVE: Hep B S Ab: POSITIVE — AB

## 2013-10-06 LAB — HEPATITIS A ANTIBODY, TOTAL: Hep A Total Ab: NONREACTIVE

## 2013-10-07 LAB — HIV-1 RNA QUANT-NO REFLEX-BLD
HIV 1 RNA Quant: <20 copies/mL (ref <20)
HIV-1 RNA Quant, Log: <1.3 {Log} (ref <1.30)

## 2013-10-07 LAB — T-HELPER CELL (CD4) - (RCID CLINIC ONLY)
CD4 % Helper T Cell: 21 % — ABNORMAL LOW (ref 33–55)
CD4 T Cell Abs: 770 /uL (ref 400–2700)

## 2013-10-19 ENCOUNTER — Ambulatory Visit (INDEPENDENT_AMBULATORY_CARE_PROVIDER_SITE_OTHER): Payer: Medicare Other | Admitting: Internal Medicine

## 2013-10-19 ENCOUNTER — Encounter: Payer: Self-pay | Admitting: Internal Medicine

## 2013-10-19 ENCOUNTER — Ambulatory Visit: Payer: Medicare Other | Admitting: Internal Medicine

## 2013-10-19 VITALS — BP 151/95 | HR 83 | Temp 97.6°F | Ht 71.0 in | Wt 351.0 lb

## 2013-10-19 DIAGNOSIS — Z23 Encounter for immunization: Secondary | ICD-10-CM

## 2013-10-19 DIAGNOSIS — J45909 Unspecified asthma, uncomplicated: Secondary | ICD-10-CM

## 2013-10-19 DIAGNOSIS — B20 Human immunodeficiency virus [HIV] disease: Secondary | ICD-10-CM

## 2013-10-19 MED ORDER — DOXYCYCLINE HYCLATE 100 MG PO TABS: 100.0000 mg | ORAL_TABLET | Freq: Two times a day (BID) | ORAL | Status: DC

## 2013-10-19 NOTE — Assessment & Plan Note (Signed)
May be infectious component with long duration.  Will try short course of doxycycline.  He is working on allergy control.  I told him to avoid Flonase due to interaction with norvir.

## 2013-10-19 NOTE — Progress Notes (Signed)
  Subjective:    Patient ID: Vincent Young, male    DOB: 16-Apr-1966, 48 y.o.   MRN: [id number]  HPI  He comes in for routine followup. He continues on Tivicay, Reyataz, Norvir and Truvada. He feels well and since his last visit he endorses complete compliance with his medications, though missed two times just this week. His viral load is undetectable and his CD4 count is 770. He has no complaints. No weight loss, no diarrhea.    Some cough for more than 1 month, green sputum.  + allergies.    Review of Systems  Constitutional: Negative for fatigue and unexpected weight change.  HENT: Positive for sore throat. Negative for trouble swallowing.   Eyes: Negative for visual disturbance.  Respiratory: Positive for cough. Negative for shortness of breath.        Some green-tinged sputum  Gastrointestinal: Negative for nausea, abdominal pain and diarrhea.  Musculoskeletal: Negative for arthralgias and myalgias.  Skin: Negative for rash.  Neurological: Negative for dizziness, light-headedness and headaches.       Objective:   Physical Exam  Constitutional: He is oriented to person, place, and time. He appears well-developed and well-nourished. No distress.  Morbidly obese  HENT:  Mouth/Throat: No oropharyngeal exudate.  Eyes: Right eye exhibits no discharge. Left eye exhibits no discharge. No scleral icterus.  Cardiovascular: Normal rate, regular rhythm and normal heart sounds.   No murmur heard. Pulmonary/Chest: Effort normal and breath sounds normal. No respiratory distress. He has no wheezes.  Lymphadenopathy:    He has no cervical adenopathy.  Neurological: He is alert and oriented to person, place, and time.  Skin: Skin is warm and dry. No rash noted.  Psychiatric: He has a normal mood and affect. His behavior is normal.          Assessment & Plan:

## 2013-10-19 NOTE — Assessment & Plan Note (Signed)
Doing well on his salvage regimen.  RTC 6 months.

## 2013-10-28 ENCOUNTER — Other Ambulatory Visit: Payer: Self-pay | Admitting: Internal Medicine

## 2013-10-28 DIAGNOSIS — B20 Human immunodeficiency virus [HIV] disease: Secondary | ICD-10-CM

## 2013-11-23 ENCOUNTER — Encounter (HOSPITAL_COMMUNITY): Payer: Self-pay | Admitting: Emergency Medicine

## 2013-11-23 ENCOUNTER — Other Ambulatory Visit: Payer: Self-pay

## 2013-11-23 ENCOUNTER — Emergency Department (HOSPITAL_COMMUNITY): Payer: Medicare Other

## 2013-11-23 DIAGNOSIS — Z880 Allergy status to penicillin: Secondary | ICD-10-CM | POA: Insufficient documentation

## 2013-11-23 DIAGNOSIS — E669 Obesity, unspecified: Secondary | ICD-10-CM | POA: Insufficient documentation

## 2013-11-23 DIAGNOSIS — I10 Essential (primary) hypertension: Secondary | ICD-10-CM | POA: Insufficient documentation

## 2013-11-23 DIAGNOSIS — M129 Arthropathy, unspecified: Secondary | ICD-10-CM | POA: Insufficient documentation

## 2013-11-23 DIAGNOSIS — Z8619 Personal history of other infectious and parasitic diseases: Secondary | ICD-10-CM | POA: Insufficient documentation

## 2013-11-23 DIAGNOSIS — Z791 Long term (current) use of non-steroidal anti-inflammatories (NSAID): Secondary | ICD-10-CM | POA: Insufficient documentation

## 2013-11-23 DIAGNOSIS — Z79899 Other long term (current) drug therapy: Secondary | ICD-10-CM | POA: Insufficient documentation

## 2013-11-23 DIAGNOSIS — Z872 Personal history of diseases of the skin and subcutaneous tissue: Secondary | ICD-10-CM | POA: Insufficient documentation

## 2013-11-23 DIAGNOSIS — Z21 Asymptomatic human immunodeficiency virus [HIV] infection status: Secondary | ICD-10-CM | POA: Insufficient documentation

## 2013-11-23 DIAGNOSIS — R0789 Other chest pain: Secondary | ICD-10-CM | POA: Insufficient documentation

## 2013-11-23 DIAGNOSIS — Z87891 Personal history of nicotine dependence: Secondary | ICD-10-CM | POA: Insufficient documentation

## 2013-11-23 LAB — CBC
HCT: 40.8 % (ref 39.0–52.0)
Hemoglobin: 13.8 g/dL (ref 13.0–17.0)
MCH: 31.4 pg (ref 26.0–34.0)
MCHC: 33.8 g/dL (ref 30.0–36.0)
MCV: 92.7 fL (ref 78.0–100.0)
Platelets: 280 10*3/uL (ref 150–400)
RBC: 4.4 MIL/uL (ref 4.22–5.81)
RDW: 13.8 % (ref 11.5–15.5)
WBC: 9.4 10*3/uL (ref 4.0–10.5)

## 2013-11-23 LAB — BASIC METABOLIC PANEL
Anion gap: 11 (ref 5–15)
BUN: 14 mg/dL (ref 6–23)
CO2: 27 mEq/L (ref 19–32)
Calcium: 9.2 mg/dL (ref 8.4–10.5)
Chloride: 102 mEq/L (ref 96–112)
Creatinine, Ser: 1.23 mg/dL (ref 0.50–1.35)
GFR calc Af Amer: 78 mL/min — ABNORMAL LOW (ref >90)
GFR calc non Af Amer: 67 mL/min — ABNORMAL LOW (ref >90)
Glucose, Bld: 124 mg/dL — ABNORMAL HIGH (ref 70–99)
Potassium: 4.1 mEq/L (ref 3.7–5.3)
Sodium: 140 mEq/L (ref 137–147)

## 2013-11-23 LAB — I-STAT TROPONIN, ED: Troponin i, poc: 0 ng/mL (ref 0.00–0.08)

## 2013-11-23 NOTE — ED Notes (Signed)
Patient with chest pain and shortness of breath that started 33mins PTA to ED.  Patient denies any nausea or vomiting, but states he did have some dizziness with the pain.

## 2013-11-24 ENCOUNTER — Emergency Department (HOSPITAL_COMMUNITY)
Admission: EM | Admit: 2013-11-24 | Discharge: 2013-11-24 | Disposition: A | Discharge status: Home / Self Care | Payer: Medicare Other | Attending: Emergency Medicine | Admitting: Emergency Medicine

## 2013-11-24 DIAGNOSIS — R0789 Other chest pain: Secondary | ICD-10-CM

## 2013-11-24 LAB — I-STAT TROPONIN, ED: Troponin i, poc: 0 ng/mL (ref 0.00–0.08)

## 2013-11-24 LAB — PRO B NATRIURETIC PEPTIDE: Pro B Natriuretic peptide (BNP): 29.9 pg/mL (ref 0–125)

## 2013-11-24 NOTE — ED Notes (Signed)
Phlebotomy drew blood and placed blood pressure cuff on pt and pt removed blood pressure cuff again.

## 2013-11-24 NOTE — ED Notes (Signed)
Pt removed monitor leeds, SpO2 sensor, and blood pressure cuff.

## 2013-11-24 NOTE — Discharge Instructions (Signed)
Chest Pain Observation It is often hard to give a specific diagnosis for the cause of chest pain. Among other possibilities your symptoms might be caused by inadequate oxygen delivery to your heart (angina). Angina that is not treated or evaluated can lead to a heart attack (myocardial infarction) or death. Blood tests, electrocardiograms, and X-rays may have been done to help determine a possible cause of your chest pain. After evaluation and observation, your health care provider has determined that it is unlikely your pain was caused by an unstable condition that requires hospitalization. However, a full evaluation of your pain may need to be completed, with additional diagnostic testing as directed. It is very important to keep your follow-up appointments. Not keeping your follow-up appointments could result in permanent heart damage, disability, or death. If there is any problem keeping your follow-up appointments, you must call your health care provider. HOME CARE INSTRUCTIONS  Due to the slight chance that your pain could be angina, it is important to follow your health care provider's treatment plan and also maintain a healthy lifestyle:  Maintain or work toward achieving a healthy weight.  Stay physically active and exercise regularly.  Decrease your salt intake.  Eat a balanced, healthy diet. Talk to a dietitian to learn about heart-healthy foods.  Increase your fiber intake by including whole grains, vegetables, fruits, and nuts in your diet.  Avoid situations that cause stress, anger, or depression.  Take medicines as advised by your health care provider. Report any side effects to your health care provider. Do not stop medicines or adjust the dosages on your own.  Quit smoking. Do not use nicotine patches or gum until you check with your health care provider.  Keep your blood pressure, blood sugar, and cholesterol levels within normal limits.  Limit alcohol intake to no more than  1 drink per day for women who are not pregnant and 2 drinks per day for men.  Do not abuse drugs. SEEK IMMEDIATE MEDICAL CARE IF: You have severe chest pain or pressure which may include symptoms such as:  You feel pain or pressure in your arms, neck, jaw, or back.  You have severe back or abdominal pain, feel sick to your stomach (nauseous), or throw up (vomit).  You are sweating profusely.  You are having a fast or irregular heartbeat.  You feel short of breath while at rest.  You notice increasing shortness of breath during rest, sleep, or with activity.  You have chest pain that does not get better after rest or after taking your usual medicine.  You wake from sleep with chest pain.  You are unable to sleep because you cannot breathe.  You develop a frequent cough or you are coughing up blood.  You feel dizzy, faint, or experience extreme fatigue.  You develop severe weakness, dizziness, fainting, or chills. Any of these symptoms may represent a serious problem that is an emergency. Do not wait to see if the symptoms will go away. Call your local emergency services (911 in the [place] Do not drive yourself to the hospital. MAKE SURE YOU:  Understand these instructions.  Will watch your condition.  Will get help right away if you are not doing well or get worse. Document Released: 06/08/2010 Document Revised: 05/11/2013 Document Reviewed: 11/05/2012 [facility] Patient Information 2015 [place], [place]. This information is not intended to replace advice given to you by your health care provider. Make sure you discuss any questions you have with your health care provider.  Pain of Unknown Etiology (Pain Without a Known Cause) You have come to your caregiver because of pain. Pain can occur in any part of the body. Often there is not a definite cause. If your laboratory (blood or urine) work was normal and X-rays or other studies were normal, your caregiver may treat you without  knowing the cause of the pain. An example of this is the headache. Most headaches are diagnosed by taking a history. This means your caregiver asks you questions about your headaches. Your caregiver determines a treatment based on your answers. Usually testing done for headaches is normal. Often testing is not done unless there is no response to medications. Regardless of where your pain is located today, you can be given medications to make you comfortable. If no physical cause of pain can be found, most cases of pain will gradually leave as suddenly as they came.  If you have a painful condition and no reason can be found for the pain, it is important that you follow up with your caregiver. If the pain becomes worse or does not go away, it may be necessary to repeat tests and look further for a possible cause.  Only take over-the-counter or prescription medicines for pain, discomfort, or fever as directed by your caregiver.  For the protection of your privacy, test results cannot be given over the phone. Make sure you receive the results of your test. Ask how these results are to be obtained if you have not been informed. It is your responsibility to obtain your test results.  You may continue all activities unless the activities cause more pain. When the pain lessens, it is important to gradually resume normal activities. Resume activities by beginning slowly and gradually increasing the intensity and duration of the activities or exercise. During periods of severe pain, bed rest may be helpful. Lie or sit in any position that is comfortable.  Ice used for acute (sudden) conditions may be effective. Use a large plastic bag filled with ice and wrapped in a towel. This may provide pain relief.  See your caregiver for continued problems. Your caregiver can help or refer you for exercises or physical therapy if necessary. If you were given medications for your condition, do not drive, operate machinery or  power tools, or sign legal documents for 24 hours. Do not drink alcohol, take sleeping pills, or take other medications that may interfere with treatment. See your caregiver immediately if you have pain that is becoming worse and not relieved by medications. Document Released: 01/29/2001 Document Revised: 02/24/2013 Document Reviewed: 05/06/2005 [facility] Patient Information 2015 [place], [place]. This information is not intended to replace advice given to you by your health care provider. Make sure you discuss any questions you have with your health care provider.

## 2013-11-24 NOTE — ED Notes (Signed)
Pt requesting to leave AMA. Denies CP at this time.

## 2013-11-24 NOTE — ED Notes (Signed)
Pt reports developing left side chest pain around 2130 last night. States that he helped a friend move yesterday afternoon. CP increases with movement and on palpation. Pt denies SOB, nausea, vomiting, diaphoresis with CP. Pt has a history of HIV.

## 2013-11-24 NOTE — ED Provider Notes (Signed)
CSN: [id number]     Arrival date & time 11/23/13  2158 History   First MD Initiated Contact with Patient 11/24/13 0142     Chief Complaint  Patient presents with  . Chest Pain  . Shortness of Breath     (Consider location/radiation/quality/duration/timing/severity/associated sxs/prior Treatment) HPI 48 year old male presents to emergency room from home with complaint of left upper chest pain.  Pain started around 9:30 tonight.  Pain is sharp stabbing in nature.  Patient reports mild dizziness associated with pain.  No radiation, no diaphoresis, no nausea or vomiting.  Patient reports father had MI at 26.  He has history of HIV, hypertension.  Pain lasted about 3 hours, and then resolved.  Patient is chest pain-free at this time.  He reports pain was present was worse with movement and palpation of the area.  Denies any trauma to the area.  He denies any current shortness of breath. Past Medical History  Diagnosis Date  . HIV infection   . Recurrent genital HSV (herpes simplex virus) infection   . Pilonidal cyst   . Arthritis   . Sleep apnea     never tested-has s/s  . Hypertension    Past Surgical History  Procedure Laterality Date  . Peg tube placement  2009  . Uvulectomy  2009  . Tonsillectomy    . Hernia repair      inguinal as a child  . Knee arthroscopy  12/11/2011    Procedure: ARTHROSCOPY KNEE;  Surgeon: Kerin Salen, MD;  Location: [place];  Service: Orthopedics;  Laterality: Right;  . 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 Heart disease Father    History  Substance Use Topics  . Smoking status: Former Smoker    Quit date: 12/04/2003   . Smokeless tobacco: Never Used     Comment: pt. no longer smokes  . Alcohol Use: Yes     Comment: occasional    Review of Systems   See History of Present Illness; otherwise all other systems are reviewed and negative  Allergies  Other; Shellfish allergy; and Penicillins  Home Medications   Prior to Admission medications   Medication Sig Start Date End Date Taking? Authorizing Provider  atazanavir (REYATAZ) 300 MG capsule Take 300 mg by mouth daily with breakfast.   Yes Historical Provider, MD  dolutegravir (TIVICAY) 50 MG tablet Take 50 mg by mouth daily.   Yes Historical Provider, MD  emtricitabine-tenofovir (TRUVADA) 200-300 MG per tablet Take 1 tablet by mouth daily.   Yes Historical Provider, MD  esomeprazole (NEXIUM) 40 MG capsule Take 40 mg by mouth daily as needed (For heartburn or acid reflux.).   Yes Historical Provider, MD  HYDROcodone-acetaminophen (NORCO/VICODIN) 5-325 MG per tablet Take 1 tablet by mouth 2 (two) times daily as needed for moderate pain.  12/31/12  Yes Historical Provider, MD  meloxicam (MOBIC) 15 MG tablet Take 15 mg by mouth daily as needed for pain.  10/05/12  Yes Historical Provider, MD  montelukast (SINGULAIR) 10 MG tablet Take 10 mg by mouth at bedtime.   Yes Historical Provider, MD  ritonavir (NORVIR) 100 MG capsule Take 100 mg by mouth daily.   Yes Historical  Provider, MD  testosterone cypionate (DEPOTESTOTERONE CYPIONATE) 200 MG/ML injection Inject 200 mg into the muscle every 14 (fourteen) days.   Yes Historical Provider, MD  valACYclovir (VALTREX) 500 MG tablet Take 500 mg by mouth 2 (two) times daily.    Yes Historical Provider, MD   BP 111/90  Pulse 84  Temp(Src) 98.2 F (36.8 C) (Oral)  Resp 19  SpO2 92% Physical Exam  Nursing note and vitals reviewed. Constitutional: He is oriented to person, place, and time. He appears well-developed and well-nourished.  Obese male, in no acute distress  HENT:  Head: Normocephalic and atraumatic.   Right Ear: External ear normal.  Left Ear: External ear normal.  Nose: Nose normal.  Mouth/Throat: Oropharynx is clear and moist.  Eyes: Conjunctivae and EOM are normal. Pupils are equal, round, and reactive to light.  Neck: Normal range of motion. Neck supple. No JVD present. No tracheal deviation present. No thyromegaly present.  Cardiovascular: Normal rate, regular rhythm, normal heart sounds and intact distal pulses.  Exam reveals no gallop and no friction rub.   No murmur heard. Pulmonary/Chest: Effort normal and breath sounds normal. No stridor. No respiratory distress. He has no wheezes. He has no rales. He exhibits tenderness (tenderness to palpation left upper chest).  Abdominal: Soft. Bowel sounds are normal. He exhibits no distension and no mass. There is no tenderness. There is no rebound and no guarding.  Musculoskeletal: Normal range of motion. He exhibits no edema and no tenderness.  Lymphadenopathy:    He has no cervical adenopathy.  Neurological: He is alert and oriented to person, place, and time. He has normal reflexes. No cranial nerve deficit. He exhibits normal muscle tone. Coordination normal.  Skin: Skin is warm and dry. No rash noted. No erythema. No pallor.  Psychiatric: He has a normal mood and affect. His behavior is normal. Judgment and thought content normal.    ED Course  Procedures (including critical care time) Labs Review Labs Reviewed  BASIC METABOLIC PANEL - Abnormal; Notable for the following:    Glucose, Bld 124 (*)    GFR calc non Af Amer 67 (*)    GFR calc Af Amer 78 (*)    All other components within normal limits  CBC  PRO B NATRIURETIC PEPTIDE  I-STAT TROPOININ, ED  I-STAT TROPOININ, ED    Imaging Review Dg Chest 2 View  11/24/2013   CLINICAL DATA:  Left-sided chest pain.  Shortness of breath.  EXAM: CHEST  2 VIEW  COMPARISON:  01/24/2013  FINDINGS: The heart size and mediastinal contours are within normal limits. Both lungs are clear.  The visualized skeletal structures are unremarkable.  IMPRESSION: No active cardiopulmonary disease.   Electronically Signed   By: Lucienne Capers M.D.   On: 11/24/2013 00:02     EKG Interpretation None      Date: 11/24/2013  Rate:89  Rhythm: normal sinus rhythm  QRS Axis: normal  Intervals: normal  ST/T Wave abnormalities: normal  Conduction Disutrbances:none  Narrative Interpretation:   Old EKG Reviewed: none available   MDM   Final diagnoses:  Atypical chest pain    48 year old male history of hypertension, HIV with left upper chest pain that is reproducible with palpation.  EKG without ischemic changes.  Troponin negative x2.  Patient to followup with his primary care Dr. for further workup should pain recur.    Kalman Drape, MD 11/24/13 [phone]

## 2014-01-23 ENCOUNTER — Other Ambulatory Visit: Payer: Self-pay | Admitting: Internal Medicine

## 2014-04-03 ENCOUNTER — Encounter (HOSPITAL_COMMUNITY): Payer: Self-pay | Admitting: Emergency Medicine

## 2014-04-03 ENCOUNTER — Emergency Department (HOSPITAL_COMMUNITY)
Admission: EM | Admit: 2014-04-03 | Discharge: 2014-04-03 | Disposition: A | Discharge status: Home / Self Care | Payer: Medicare Other | Attending: Emergency Medicine | Admitting: Emergency Medicine

## 2014-04-03 ENCOUNTER — Emergency Department (HOSPITAL_COMMUNITY): Payer: Medicare Other

## 2014-04-03 DIAGNOSIS — R059 Cough, unspecified: Secondary | ICD-10-CM

## 2014-04-03 DIAGNOSIS — R05 Cough: Secondary | ICD-10-CM | POA: Diagnosis not present

## 2014-04-03 DIAGNOSIS — Z872 Personal history of diseases of the skin and subcutaneous tissue: Secondary | ICD-10-CM | POA: Insufficient documentation

## 2014-04-03 DIAGNOSIS — Y939 Activity, unspecified: Secondary | ICD-10-CM | POA: Diagnosis not present

## 2014-04-03 DIAGNOSIS — M1990 Unspecified osteoarthritis, unspecified site: Secondary | ICD-10-CM | POA: Diagnosis not present

## 2014-04-03 DIAGNOSIS — Z8669 Personal history of other diseases of the nervous system and sense organs: Secondary | ICD-10-CM | POA: Diagnosis not present

## 2014-04-03 DIAGNOSIS — Y929 Unspecified place or not applicable: Secondary | ICD-10-CM | POA: Insufficient documentation

## 2014-04-03 DIAGNOSIS — X58XXXA Exposure to other specified factors, initial encounter: Secondary | ICD-10-CM | POA: Diagnosis not present

## 2014-04-03 DIAGNOSIS — Y998 Other external cause status: Secondary | ICD-10-CM | POA: Diagnosis not present

## 2014-04-03 DIAGNOSIS — Z21 Asymptomatic human immunodeficiency virus [HIV] infection status: Secondary | ICD-10-CM | POA: Insufficient documentation

## 2014-04-03 DIAGNOSIS — T7849XA Other allergy, initial encounter: Secondary | ICD-10-CM | POA: Insufficient documentation

## 2014-04-03 DIAGNOSIS — Z87891 Personal history of nicotine dependence: Secondary | ICD-10-CM | POA: Diagnosis not present

## 2014-04-03 DIAGNOSIS — Z8619 Personal history of other infectious and parasitic diseases: Secondary | ICD-10-CM | POA: Diagnosis not present

## 2014-04-03 DIAGNOSIS — T7840XA Allergy, unspecified, initial encounter: Secondary | ICD-10-CM

## 2014-04-03 DIAGNOSIS — Z880 Allergy status to penicillin: Secondary | ICD-10-CM | POA: Insufficient documentation

## 2014-04-03 DIAGNOSIS — Z79899 Other long term (current) drug therapy: Secondary | ICD-10-CM | POA: Diagnosis not present

## 2014-04-03 DIAGNOSIS — R220 Localized swelling, mass and lump, head: Secondary | ICD-10-CM | POA: Diagnosis present

## 2014-04-03 DIAGNOSIS — I10 Essential (primary) hypertension: Secondary | ICD-10-CM | POA: Insufficient documentation

## 2014-04-03 MED ORDER — FAMOTIDINE 20 MG PO TABS: 20.0000 mg | ORAL_TABLET | Freq: Two times a day (BID) | ORAL | Status: DC

## 2014-04-03 MED ORDER — FAMOTIDINE 20 MG PO TABS
20.0000 mg | ORAL_TABLET | Freq: Once | ORAL | Status: AC
  Administered 2014-04-03: 20 mg via ORAL
  Filled 2014-04-03: qty 1

## 2014-04-03 MED ORDER — DIPHENHYDRAMINE HCL 25 MG PO CAPS
50.0000 mg | ORAL_CAPSULE | Freq: Once | ORAL | Status: AC
  Administered 2014-04-03: 50 mg via ORAL
  Filled 2014-04-03: qty 2

## 2014-04-03 MED ORDER — PREDNISONE 10 MG PO TABS: 60.0000 mg | ORAL_TABLET | Freq: Every day | ORAL | Status: DC

## 2014-04-03 MED ORDER — PREDNISONE 20 MG PO TABS
60.0000 mg | ORAL_TABLET | Freq: Once | ORAL | Status: AC
  Administered 2014-04-03: 60 mg via ORAL
  Filled 2014-04-03: qty 3

## 2014-04-03 MED ORDER — DIPHENHYDRAMINE HCL 25 MG PO TABS: 50.0000 mg | ORAL_TABLET | Freq: Three times a day (TID) | ORAL | Status: DC | PRN

## 2014-04-03 NOTE — ED Notes (Signed)
Pt called out and reported that itching was progressively getting worse.  MD made aware.

## 2014-04-03 NOTE — ED Notes (Signed)
Hooked pt back up to the monitor with the BP cuff and pulse ox on

## 2014-04-03 NOTE — ED Notes (Signed)
To x-ray

## 2014-04-03 NOTE — ED Notes (Signed)
Pt presents with tongue swelling and lip discomfort since taking Amoxicillin 3 days ago- pt reports that when he woke up today he felt as though his symptoms were worse.  Pt taking antibiotics for bronchitis.  Admits to taking Benadryl at home without relief, pt denies increase in swelling since waking up this morning.  Airway intact, respirations e/u.

## 2014-04-03 NOTE — ED Provider Notes (Signed)
CSN: [id number]     Arrival date & time 04/03/14  1610 History   First MD Initiated Contact with Patient 04/03/14 1819     Chief Complaint  Patient presents with  . Allergic Reaction      HPI Patient presents to the emergency department complaining of tongue swelling.  He reports tongue swelling over the past several days.  Is currently on antibiotics for bronchitis.  He has a history of eczema but no history of asthma.  He reports no difficulty breathing.  No difficulty swallowing or speaking.  His wife reports the tongue seems to be improved at this point.  He is not on an ACE inhibitor.  No history of angioedema.  Past Medical History  Diagnosis Date  . HIV infection   . Recurrent genital HSV (herpes simplex virus) infection   . Pilonidal cyst   . Arthritis   . Sleep apnea     never tested-has s/s  . Hypertension    Past Surgical History  Procedure Laterality Date  . Peg tube placement  2009  . Uvulectomy  2009  . Tonsillectomy    . Hernia repair      inguinal as a child  . Knee arthroscopy  12/11/2011    Procedure: ARTHROSCOPY KNEE;  Surgeon: Kerin Salen, MD;  Location: [place];  Service: Orthopedics;  Laterality: Right;  . 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 Heart disease Father    History  Substance Use Topics  . Smoking status: Former Smoker    Quit date: 12/04/2003  . Smokeless tobacco: Never Used     Comment: pt. no longer smokes  . Alcohol Use: Yes     Comment: occasional    Review of Systems  All other systems reviewed and are negative.     Allergies  Other; Shellfish allergy; and Penicillins  Home Medications    Prior to Admission medications   Medication Sig Start Date End Date Taking? Authorizing Provider  atazanavir (REYATAZ) 300 MG capsule Take 300 mg by mouth daily with breakfast.   Yes Historical Provider, MD  dolutegravir (TIVICAY) 50 MG tablet Take 50 mg by mouth daily.   Yes Historical Provider, MD  doxycycline (MONODOX) 100 MG capsule Take 100 mg by mouth 2 (two) times daily. Started on 04-01-14 for 10 days   Yes Historical Provider, MD  emtricitabine-tenofovir (TRUVADA) 200-300 MG per tablet Take 1 tablet by mouth daily.   Yes Historical Provider, MD  esomeprazole (NEXIUM) 40 MG capsule Take 40 mg by mouth daily as needed (For heartburn or acid reflux.).   Yes Historical Provider, MD  HYDROcodone-acetaminophen (NORCO/VICODIN) 5-325 MG per tablet Take 1 tablet by mouth 2 (two) times daily as needed for moderate pain.  12/31/12  Yes Historical Provider, MD  meloxicam (MOBIC) 15 MG tablet Take 15 mg by mouth daily as needed for pain.  10/05/12  Yes Historical Provider, MD  montelukast (SINGULAIR) 10 MG tablet Take 10 mg by mouth at bedtime.   Yes Historical Provider, MD  ritonavir (NORVIR) 100 MG capsule Take 100 mg by mouth daily.   Yes Historical Provider, MD  testosterone cypionate (DEPOTESTOTERONE CYPIONATE) 200 MG/ML injection Inject 200 mg into  the muscle every 14 (fourteen) days.   Yes Historical Provider, MD  TRUVADA 200-300 MG per tablet TAKE 1 TABLET BY MOUTH DAILY 01/25/14  Yes Thayer Headings, MD  valACYclovir (VALTREX) 500 MG tablet Take 500 mg by mouth 2 (two) times daily.    Yes Historical Provider, MD                       TIVICAY 50 MG tablet TAKE 1 TABLET BY MOUTH EVERY DAY 01/25/14   Thayer Headings, MD   BP 143/82 mmHg  Pulse 77  Temp(Src) 98.6 F (37 C) (Oral)  Resp 13  Ht 6\' 1"  (1.854 m)  Wt 366 lb 4 oz (166.13 kg)  BMI 48.33 kg/m2  SpO2 96% Physical Exam  Constitutional: He is oriented to person, place, and time. He appears well-developed and well-nourished.  HENT:   Head: Normocephalic.  No significant swelling of the tongue.  Oral airway patent.  Tolerating secretions.  No swelling of his lips.Under his tongue is soft.  Dentition without acute abnormality  Eyes: EOM are normal.  Neck: Normal range of motion.  Pulmonary/Chest: Effort normal and breath sounds normal. No respiratory distress. He has no wheezes.  Abdominal: He exhibits no distension.  Musculoskeletal: Normal range of motion.  Neurological: He is alert and oriented to person, place, and time.  Psychiatric: He has a normal mood and affect.  Nursing note and vitals reviewed.   ED Course  Procedures (including critical care time) Labs Review Labs Reviewed - No data to display  Imaging Review Dg Chest 2 View  04/03/2014   CLINICAL DATA:  Allergic reaction.  Angioedema.  Initial encounter.  EXAM: CHEST  2 VIEW  COMPARISON:  11/23/2013.  FINDINGS: Low volume chest. Basilar atelectasis. Cardiopericardial silhouette within normal limits. Mediastinal contours normal. Trachea midline. No airspace disease or effusion.  IMPRESSION: Low volume chest with basilar atelectasis.   Electronically Signed   By: Dereck Ligas M.D.   On: 04/03/2014 19:52  I personally reviewed the imaging tests through PACS system I reviewed available ER/hospitalization records through the EMR    EKG Interpretation None      MDM   Final diagnoses:  Cough  Allergic reaction, initial encounter    Patient feels much better after treatment here.  Chest x-ray clear.  No evidence of pneumonia.  I will stop all the patient's antibiotics.  This seems to be a viral bronchitis.  No significant swelling of his tongue is noticed.  He states he feels better.  Discharge home with a short course of prednisone, Benadryl, Pepcid.    Hoy Morn, MD 04/03/14 2040

## 2014-04-03 NOTE — ED Notes (Signed)
Pt transported to xray 

## 2014-04-23 ENCOUNTER — Other Ambulatory Visit: Payer: Self-pay | Admitting: Internal Medicine

## 2014-04-26 ENCOUNTER — Other Ambulatory Visit: Payer: Medicare Other

## 2014-04-26 DIAGNOSIS — B20 Human immunodeficiency virus [HIV] disease: Secondary | ICD-10-CM

## 2014-04-26 LAB — COMPLETE METABOLIC PANEL WITH GFR
ALT: 17 U/L (ref 0–53)
AST: 17 U/L (ref 0–37)
Albumin: 3.4 g/dL — ABNORMAL LOW (ref 3.5–5.2)
Alkaline Phosphatase: 114 U/L (ref 39–117)
BUN: 13 mg/dL (ref 6–23)
CO2: 27 mEq/L (ref 19–32)
Calcium: 9.2 mg/dL (ref 8.4–10.5)
Chloride: 105 mEq/L (ref 96–112)
Creat: 1.19 mg/dL (ref 0.50–1.35)
GFR, Est African American: 82 mL/min
GFR, Est Non African American: 71 mL/min
Glucose, Bld: 115 mg/dL — ABNORMAL HIGH (ref 70–99)
Potassium: 4.8 mEq/L (ref 3.5–5.3)
Sodium: 138 mEq/L (ref 135–145)
Total Bilirubin: 1.6 mg/dL — ABNORMAL HIGH (ref 0.2–1.2)
Total Protein: 6.7 g/dL (ref 6.0–8.3)

## 2014-04-26 LAB — CBC WITH DIFFERENTIAL/PLATELET
Basophils Absolute: 0 10*3/uL (ref 0.0–0.1)
Basophils Relative: 0 % (ref 0–1)
Eosinophils Absolute: 0.2 10*3/uL (ref 0.0–0.7)
Eosinophils Relative: 3 % (ref 0–5)
HCT: 40.2 % (ref 39.0–52.0)
Hemoglobin: 14.2 g/dL (ref 13.0–17.0)
Lymphocytes Relative: 41 % (ref 12–46)
Lymphs Abs: 2.7 10*3/uL (ref 0.7–4.0)
MCH: 31.4 pg (ref 26.0–34.0)
MCHC: 35.3 g/dL (ref 30.0–36.0)
MCV: 88.9 fL (ref 78.0–100.0)
MPV: 8.5 fL — ABNORMAL LOW (ref 9.4–12.4)
Monocytes Absolute: 0.5 10*3/uL (ref 0.1–1.0)
Monocytes Relative: 7 % (ref 3–12)
Neutro Abs: 3.2 10*3/uL (ref 1.7–7.7)
Neutrophils Relative %: 49 % (ref 43–77)
Platelets: 270 10*3/uL (ref 150–400)
RBC: 4.52 MIL/uL (ref 4.22–5.81)
RDW: 15.1 % (ref 11.5–15.5)
WBC: 6.6 10*3/uL (ref 4.0–10.5)

## 2014-04-27 LAB — T-HELPER CELL (CD4) - (RCID CLINIC ONLY)
CD4 % Helper T Cell: 23 % — ABNORMAL LOW (ref 33–55)
CD4 T Cell Abs: 830 /uL (ref 400–2700)

## 2014-04-27 LAB — HIV-1 RNA QUANT-NO REFLEX-BLD
HIV 1 RNA Quant: <20 copies/mL (ref <20)
HIV-1 RNA Quant, Log: <1.3 {Log} (ref <1.30)

## 2014-05-15 ENCOUNTER — Encounter (HOSPITAL_COMMUNITY): Payer: Self-pay | Admitting: Nurse Practitioner

## 2014-05-15 ENCOUNTER — Emergency Department (HOSPITAL_COMMUNITY)
Admission: EM | Admit: 2014-05-15 | Discharge: 2014-05-15 | Disposition: A | Discharge status: Home / Self Care | Payer: Medicare Other | Attending: Emergency Medicine | Admitting: Emergency Medicine

## 2014-05-15 ENCOUNTER — Emergency Department (HOSPITAL_COMMUNITY): Payer: Medicare Other

## 2014-05-15 DIAGNOSIS — G4730 Sleep apnea, unspecified: Secondary | ICD-10-CM | POA: Insufficient documentation

## 2014-05-15 DIAGNOSIS — R600 Localized edema: Secondary | ICD-10-CM | POA: Diagnosis not present

## 2014-05-15 DIAGNOSIS — Z8739 Personal history of other diseases of the musculoskeletal system and connective tissue: Secondary | ICD-10-CM | POA: Diagnosis not present

## 2014-05-15 DIAGNOSIS — Z21 Asymptomatic human immunodeficiency virus [HIV] infection status: Secondary | ICD-10-CM | POA: Insufficient documentation

## 2014-05-15 DIAGNOSIS — R079 Chest pain, unspecified: Secondary | ICD-10-CM | POA: Diagnosis present

## 2014-05-15 DIAGNOSIS — J40 Bronchitis, not specified as acute or chronic: Secondary | ICD-10-CM

## 2014-05-15 DIAGNOSIS — Z79899 Other long term (current) drug therapy: Secondary | ICD-10-CM | POA: Insufficient documentation

## 2014-05-15 DIAGNOSIS — Z791 Long term (current) use of non-steroidal anti-inflammatories (NSAID): Secondary | ICD-10-CM | POA: Diagnosis not present

## 2014-05-15 DIAGNOSIS — Z872 Personal history of diseases of the skin and subcutaneous tissue: Secondary | ICD-10-CM | POA: Insufficient documentation

## 2014-05-15 DIAGNOSIS — Z7951 Long term (current) use of inhaled steroids: Secondary | ICD-10-CM | POA: Insufficient documentation

## 2014-05-15 DIAGNOSIS — Z8619 Personal history of other infectious and parasitic diseases: Secondary | ICD-10-CM | POA: Diagnosis not present

## 2014-05-15 DIAGNOSIS — Z880 Allergy status to penicillin: Secondary | ICD-10-CM | POA: Diagnosis not present

## 2014-05-15 DIAGNOSIS — R0789 Other chest pain: Secondary | ICD-10-CM

## 2014-05-15 DIAGNOSIS — J209 Acute bronchitis, unspecified: Secondary | ICD-10-CM | POA: Insufficient documentation

## 2014-05-15 DIAGNOSIS — Z87891 Personal history of nicotine dependence: Secondary | ICD-10-CM | POA: Diagnosis not present

## 2014-05-15 DIAGNOSIS — I10 Essential (primary) hypertension: Secondary | ICD-10-CM | POA: Insufficient documentation

## 2014-05-15 LAB — CBC
HCT: 44.1 % (ref 39.0–52.0)
Hemoglobin: 14.8 g/dL (ref 13.0–17.0)
MCH: 31.4 pg (ref 26.0–34.0)
MCHC: 33.6 g/dL (ref 30.0–36.0)
MCV: 93.4 fL (ref 78.0–100.0)
Platelets: 254 10*3/uL (ref 150–400)
RBC: 4.72 MIL/uL (ref 4.22–5.81)
RDW: 14.3 % (ref 11.5–15.5)
WBC: 7 10*3/uL (ref 4.0–10.5)

## 2014-05-15 LAB — BASIC METABOLIC PANEL
Anion gap: 7 (ref 5–15)
BUN: 12 mg/dL (ref 6–23)
CO2: 25 mmol/L (ref 19–32)
Calcium: 9.2 mg/dL (ref 8.4–10.5)
Chloride: 106 mEq/L (ref 96–112)
Creatinine, Ser: 1.49 mg/dL — ABNORMAL HIGH (ref 0.50–1.35)
GFR calc Af Amer: 61 mL/min — ABNORMAL LOW (ref >90)
GFR calc non Af Amer: 53 mL/min — ABNORMAL LOW (ref >90)
Glucose, Bld: 116 mg/dL — ABNORMAL HIGH (ref 70–99)
Potassium: 5 mmol/L (ref 3.5–5.1)
Sodium: 138 mmol/L (ref 135–145)

## 2014-05-15 LAB — I-STAT TROPONIN, ED: Troponin i, poc: 0 ng/mL (ref 0.00–0.08)

## 2014-05-15 LAB — TROPONIN I: Troponin I: <0.03 ng/mL (ref <0.031)

## 2014-05-15 LAB — D-DIMER, QUANTITATIVE: D-Dimer, Quant: 0.37 ug/mL-FEU (ref 0.00–0.48)

## 2014-05-15 LAB — BRAIN NATRIURETIC PEPTIDE: B Natriuretic Peptide: 18.2 pg/mL (ref 0.0–100.0)

## 2014-05-15 MED ORDER — DOXYCYCLINE HYCLATE 100 MG PO CAPS: 100.0000 mg | ORAL_CAPSULE | Freq: Two times a day (BID) | ORAL | Status: DC

## 2014-05-15 MED ORDER — ALBUTEROL SULFATE HFA 108 (90 BASE) MCG/ACT IN AERS: 1.0000 | INHALATION_SPRAY | Freq: Four times a day (QID) | RESPIRATORY_TRACT | Status: DC | PRN

## 2014-05-15 MED ORDER — IBUPROFEN 800 MG PO TABS
800.0000 mg | ORAL_TABLET | Freq: Once | ORAL | Status: AC
  Administered 2014-05-15: 800 mg via ORAL
  Filled 2014-05-15: qty 1

## 2014-05-15 MED ORDER — AZITHROMYCIN 250 MG PO TABS: 250.0000 mg | ORAL_TABLET | Freq: Every day | ORAL | Status: DC

## 2014-05-15 NOTE — ED Notes (Signed)
Pt st's he was dx with bronchitis and put on antibiotics.  St's his MD stopped the antibiotics and put him on steroids.  Pt st's he has continued to have productive cough.

## 2014-05-15 NOTE — ED Notes (Signed)
Pt reports he was recently treated for bronchitis. He complains of continued "phlegm production. it gets so bad at night that i can not even lie down to sleep." hes also been having pain in his chest when he coughs.

## 2014-05-15 NOTE — ED Notes (Signed)
Pt requesting pain med.  St's he has pain in his chest when he coughs but denies any chest pain when not coughing.

## 2014-05-15 NOTE — ED Notes (Signed)
Dr. Rancour at bedside. 

## 2014-05-15 NOTE — Discharge Instructions (Signed)
Acute Bronchitis Take the antibiotics as prescribed. Follow up with your doctor. Return to the ED if you develop new or worsening symptoms.  Bronchitis is inflammation of the airways that extend from the windpipe into the lungs (bronchi). The inflammation often causes mucus to develop. This leads to a cough, which is the most common symptom of bronchitis.  In acute bronchitis, the condition usually develops suddenly and goes away over time, usually in a couple weeks. Smoking, allergies, and asthma can make bronchitis worse. Repeated episodes of bronchitis may cause further lung problems.  CAUSES Acute bronchitis is most often caused by the same virus that causes a cold. The virus can spread from person to person (contagious) through coughing, sneezing, and touching contaminated objects. SIGNS AND SYMPTOMS   Cough.   Fever.   Coughing up mucus.   Body aches.   Chest congestion.   Chills.   Shortness of breath.   Sore throat.  DIAGNOSIS  Acute bronchitis is usually diagnosed through a physical exam. Your health care provider will also ask you questions about your medical history. Tests, such as chest X-rays, are sometimes done to rule out other conditions.  TREATMENT  Acute bronchitis usually goes away in a couple weeks. Oftentimes, no medical treatment is necessary. Medicines are sometimes given for relief of fever or cough. Antibiotic medicines are usually not needed but may be prescribed in certain situations. In some cases, an inhaler may be recommended to help reduce shortness of breath and control the cough. A cool mist vaporizer may also be used to help thin bronchial secretions and make it easier to clear the chest.  HOME CARE INSTRUCTIONS  Get plenty of rest.   Drink enough fluids to keep your urine clear or pale yellow (unless you have a medical condition that requires fluid restriction). Increasing fluids may help thin your respiratory secretions (sputum) and reduce  chest congestion, and it will prevent dehydration.   Take medicines only as directed by your health care provider.  If you were prescribed an antibiotic medicine, finish it all even if you start to feel better.  Avoid smoking and secondhand smoke. Exposure to cigarette smoke or irritating chemicals will make bronchitis worse. If you are a smoker, consider using nicotine gum or skin patches to help control withdrawal symptoms. Quitting smoking will help your lungs heal faster.   Reduce the chances of another bout of acute bronchitis by washing your hands frequently, avoiding people with cold symptoms, and trying not to touch your hands to your mouth, nose, or eyes.   Keep all follow-up visits as directed by your health care provider.  SEEK MEDICAL CARE IF: Your symptoms do not improve after 1 week of treatment.  SEEK IMMEDIATE MEDICAL CARE IF:  You develop an increased fever or chills.   You have chest pain.   You have severe shortness of breath.  You have bloody sputum.   You develop dehydration.  You faint or repeatedly feel like you are going to pass out.  You develop repeated vomiting.  You develop a severe headache. MAKE SURE YOU:   Understand these instructions.  Will watch your condition.  Will get help right away if you are not doing well or get worse. Document Released: 06/13/2004 Document Revised: 09/20/2013 Document Reviewed: 10/27/2012 [facility] Patient Information 2015 [place], [place]. This information is not intended to replace advice given to you by your health care provider. Make sure you discuss any questions you have with your health care provider.

## 2014-05-15 NOTE — ED Provider Notes (Signed)
CSN: [id number]     Arrival date & time 05/15/14  1710 History   First MD Initiated Contact with Patient 05/15/14 1922     Chief Complaint  Patient presents with  . Chest Pain     (Consider location/radiation/quality/duration/timing/severity/associated sxs/prior Treatment) HPI Comments: Patient with four-day history of constant left-sided chest pain worse with coughing and movement and palpation. Nothing makes it better. It is worse with coughing. He states he's been coughing up brown phlegm for 2 months. He was treated for bronchitis last month and never improved. Denies any fever, chills, nausea or vomiting. No abdominal pain. No focal weakness, numbness or tingling. No urinary symptoms. Patient has a history of HIV and states compliance with medications. CD4 count was 870 December 8. He does not smoke. No history of cardiac or pulmonary problems.  The history is provided by the patient.    Past Medical History  Diagnosis Date  . HIV infection   . Recurrent genital HSV (herpes simplex virus) infection   . Pilonidal cyst   . Arthritis   . Sleep apnea     never tested-has s/s  . Hypertension    Past Surgical History  Procedure Laterality Date  . Peg tube placement  2009  . Uvulectomy  2009  . Tonsillectomy    . Hernia repair      inguinal as a child  . Knee arthroscopy  12/11/2011    Procedure: ARTHROSCOPY KNEE;  Surgeon: Kerin Salen, MD;  Location: [place] [facility];  Service: Orthopedics;  Laterality: Right;  . 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 Heart disease Father    History  Substance Use Topics  . Smoking status: Former Smoker    Quit  date: 12/04/2003  . Smokeless tobacco: Never Used     Comment: pt. no longer smokes  . Alcohol Use: Yes     Comment: occasional    Review of Systems  Constitutional: Negative for activity change and appetite change.  HENT: Negative for congestion and rhinorrhea.   Eyes: Negative for visual disturbance.  Respiratory: Positive for cough, chest tightness and shortness of breath.   Cardiovascular: Positive for chest pain.  Gastrointestinal: Negative for nausea, vomiting and abdominal pain.  Genitourinary: Negative for dysuria and hematuria.  Musculoskeletal: Negative for myalgias, back pain and arthralgias.  Skin: Negative for wound.  Neurological: Negative for dizziness, weakness and headaches.  A complete 10 system review of systems was obtained and all systems are negative except as noted in the HPI and PMH.      Allergies  Other; Shellfish allergy; and Penicillins  Home Medications   Prior to Admission medications   Medication Sig Start Date End Date Taking? Authorizing Provider  albuterol (PROVENTIL HFA;VENTOLIN HFA) 108 (90 BASE) MCG/ACT inhaler Inhale 1-2 puffs into the lungs every 6 (six) hours as needed for wheezing or shortness of breath. 05/15/14   Ezequiel Essex, MD  atazanavir (REYATAZ) 300 MG capsule Take 300 mg by mouth daily with breakfast.    Historical Provider, MD  azithromycin (ZITHROMAX) 250 MG tablet Take 1 tablet (250 mg total) by mouth daily. Take first 2 tablets together, then 1 every day until finished. 05/15/14   Annie Main  Caprina Wussow, MD  diphenhydrAMINE (BENADRYL) 25 MG tablet Take 2 tablets (50 mg total) by mouth every 8 (eight) hours as needed (allergic reaction). 04/03/14   Hoy Morn, MD  dolutegravir (TIVICAY) 50 MG tablet Take 50 mg by mouth daily.    Historical Provider, MD  doxycycline (MONODOX) 100 MG capsule Take 100 mg by mouth 2 (two) times daily. Started on 04-01-14 for 10 days    Historical Provider, MD  doxycycline (VIBRAMYCIN) 100 MG capsule  Take 1 capsule (100 mg total) by mouth 2 (two) times daily. 05/15/14   Ezequiel Essex, MD  emtricitabine-tenofovir (TRUVADA) 200-300 MG per tablet Take 1 tablet by mouth daily.    Historical Provider, MD  esomeprazole (NEXIUM) 40 MG capsule Take 40 mg by mouth daily as needed (For heartburn or acid reflux.).    Historical Provider, MD  famotidine (PEPCID) 20 MG tablet Take 1 tablet (20 mg total) by mouth 2 (two) times daily. 04/03/14   Hoy Morn, MD  HYDROcodone-acetaminophen (NORCO/VICODIN) 5-325 MG per tablet Take 1 tablet by mouth 2 (two) times daily as needed for moderate pain.  12/31/12   Historical Provider, MD  meloxicam (MOBIC) 15 MG tablet Take 15 mg by mouth daily as needed for pain.  10/05/12   Historical Provider, MD  montelukast (SINGULAIR) 10 MG tablet Take 10 mg by mouth at bedtime.    Historical Provider, MD  predniSONE (DELTASONE) 10 MG tablet Take 6 tablets (60 mg total) by mouth daily. 04/03/14   Hoy Morn, MD  REYATAZ 300 MG capsule TAKE ONE CAPSULE BY MOUTH DAILY AT [facility] 04/25/14   Thayer Headings, MD  ritonavir (NORVIR) 100 MG capsule Take 100 mg by mouth daily.    Historical Provider, MD  testosterone cypionate (DEPOTESTOTERONE CYPIONATE) 200 MG/ML injection Inject 200 mg into the muscle every 14 (fourteen) days.    Historical Provider, MD  TIVICAY 50 MG tablet TAKE 1 TABLET BY MOUTH EVERY DAY 01/25/14   Thayer Headings, MD  TRUVADA 200-300 MG per tablet TAKE 1 TABLET BY MOUTH DAILY 01/25/14   Thayer Headings, MD  valACYclovir (VALTREX) 500 MG tablet Take 500 mg by mouth 2 (two) times daily.     Historical Provider, MD   BP 141/83 mmHg  Pulse 89  Temp(Src) 97.8 F (36.6 C) (Oral)  Resp 11  SpO2 95% Physical Exam  Constitutional: He is oriented to person, place, and time. He appears well-developed and well-nourished. No distress.  HENT:  Head: Normocephalic and atraumatic.  Mouth/Throat: Oropharynx is clear and moist. No oropharyngeal exudate.  Eyes: Conjunctivae  and EOM are normal. Pupils are equal, round, and reactive to light.  Neck: Normal range of motion. Neck supple.  No meningismus.  Cardiovascular: Normal rate, regular rhythm, normal heart sounds and intact distal pulses.   No murmur heard. Pulmonary/Chest: Effort normal and breath sounds normal. No respiratory distress. He exhibits tenderness.  L chest wall tenderness  Abdominal: Soft. There is no tenderness. There is no rebound and no guarding.  Musculoskeletal: Normal range of motion. He exhibits edema. He exhibits no tenderness.  Trace pedal edema  Neurological: He is alert and oriented to person, place, and time. No cranial nerve deficit. He exhibits normal muscle tone. Coordination normal.  No ataxia on finger to nose bilaterally. No pronator drift. 5/5 strength throughout. CN 2-12 intact. Negative Romberg. Equal grip strength. Sensation intact. Gait is normal.   Skin: Skin is warm.  Psychiatric: He has a normal mood and affect.  His behavior is normal.  Nursing note and vitals reviewed.   ED Course  Procedures (including critical care time) Labs Review Labs Reviewed  BASIC METABOLIC PANEL - Abnormal; Notable for the following:    Glucose, Bld 116 (*)    Creatinine, Ser 1.49 (*)    GFR calc non Af Amer 53 (*)    GFR calc Af Amer 61 (*)    All other components within normal limits  BRAIN NATRIURETIC PEPTIDE  CBC  TROPONIN I  D-DIMER, QUANTITATIVE  I-STAT TROPOININ, ED    Imaging Review Dg Chest 2 View  05/15/2014   CLINICAL DATA:  Productive cough for 2 months. Shortness of breath for 4 days.  EXAM: CHEST  2 VIEW  COMPARISON:  PA and lateral chest 04/03/2014.  CT chest 06/09/2013.  FINDINGS: Heart size and mediastinal contours are within normal limits. Both lungs are clear. Visualized skeletal structures are unremarkable.  IMPRESSION: Negative exam.   Electronically Signed   By: Inge Rise M.D.   On: 05/15/2014 18:33     EKG Interpretation   Date/Time:  Sunday  May 15 2014 17:16:08 EST Ventricular Rate:  105 PR Interval:  144 QRS Duration: 86 QT Interval:  326 QTC Calculation: 430 R Axis:   77 Text Interpretation:  Sinus tachycardia Otherwise normal ECG Rate faster  Confirmed by Ludia Gartland  MD, Cleota Pellerito [phone] on 05/15/2014 7:41:25 PM      MDM   Final diagnoses:  Bronchitis  Chest wall pain   Four-day history of constant chest pain worse with coughing and palpation. History of bronchitis and immunocompetent HIV. EKG normal sinus rhythm. Chest x-ray negative.  Chest x-ray negative Troponin negative 2. D-dimer negative.  Discussed with Dr. Novella Olive of infectious disease. He agrees with treating for bronchitis with antibiotics and bronchodilators. We'll not give steroids as patient is not wheezing.  patient will be treated with azithromycin. Follow up with PCP. Return precautions discussed. Low suspicion for ACS or PE.    Ezequiel Essex, MD 05/16/14 Berniece Salines

## 2014-05-20 ENCOUNTER — Other Ambulatory Visit: Payer: Self-pay | Admitting: Internal Medicine

## 2014-05-31 ENCOUNTER — Encounter: Payer: Self-pay | Admitting: Internal Medicine

## 2014-05-31 ENCOUNTER — Ambulatory Visit (INDEPENDENT_AMBULATORY_CARE_PROVIDER_SITE_OTHER): Payer: Medicare Other | Admitting: Internal Medicine

## 2014-05-31 VITALS — BP 147/94 | HR 90 | Temp 98.2°F | Ht 73.0 in | Wt 380.0 lb

## 2014-05-31 DIAGNOSIS — Z23 Encounter for immunization: Secondary | ICD-10-CM

## 2014-05-31 DIAGNOSIS — Z79899 Other long term (current) drug therapy: Secondary | ICD-10-CM

## 2014-05-31 DIAGNOSIS — B20 Human immunodeficiency virus [HIV] disease: Secondary | ICD-10-CM

## 2014-05-31 DIAGNOSIS — Z113 Encounter for screening for infections with a predominantly sexual mode of transmission: Secondary | ICD-10-CM

## 2014-05-31 NOTE — Progress Notes (Signed)
  Subjective:    Patient ID: Rocco Kerkhoff, male    DOB: 11/04/1965, 49 y.o.   MRN: [id number]  HPI He comes in for routine followup. He continues on Tivicay, Reyataz, Norvir and Truvada. He feels well and since his last visit he endorses complete compliance with his medications. His viral load is undetectable and his CD4 count is 830. He has no complaints. No weight loss, no diarrhea.     Review of Systems  Constitutional: Negative for fatigue and unexpected weight change.  HENT: Negative for trouble swallowing.   Eyes: Negative for visual disturbance.  Gastrointestinal: Negative for nausea, abdominal pain and diarrhea.  Skin: Negative for rash.  Neurological: Negative for dizziness, light-headedness and headaches.       Objective:   Physical Exam  Constitutional: He appears well-developed and well-nourished. No distress.  HENT:  Mouth/Throat: No oropharyngeal exudate.  Eyes: No scleral icterus.  Cardiovascular: Normal rate, regular rhythm and normal heart sounds.   No murmur heard. Pulmonary/Chest: Effort normal and breath sounds normal. No respiratory distress.  Lymphadenopathy:    He has no cervical adenopathy.  Skin: No rash noted.          Assessment & Plan:

## 2014-05-31 NOTE — Addendum Note (Signed)
Addended by: Myrtis Hopping A on: 05/31/2014 10:13 AM   Modules accepted: Orders

## 2014-05-31 NOTE — Addendum Note (Signed)
Addended by: Thayer Headings on: 05/31/2014 10:11 AM   Modules accepted: Orders

## 2014-05-31 NOTE — Assessment & Plan Note (Signed)
Doing great on his salvage regimen. He will continue with the same and return in 6 months.

## 2014-06-02 ENCOUNTER — Encounter (HOSPITAL_BASED_OUTPATIENT_CLINIC_OR_DEPARTMENT_OTHER): Payer: Self-pay | Admitting: Orthopedic Surgery

## 2014-06-06 ENCOUNTER — Telehealth: Payer: Self-pay | Admitting: *Deleted

## 2014-06-06 NOTE — Telephone Encounter (Signed)
Called the [facility] and got Mr. Hernandez approved for the $7500.00 grant.  It is good through 06-03-14

## 2014-07-20 ENCOUNTER — Emergency Department (HOSPITAL_COMMUNITY)
Admission: EM | Admit: 2014-07-20 | Discharge: 2014-07-20 | Disposition: A | Discharge status: Home / Self Care | Payer: Medicare Other | Attending: Emergency Medicine | Admitting: Emergency Medicine

## 2014-07-20 ENCOUNTER — Encounter (HOSPITAL_COMMUNITY): Payer: Self-pay

## 2014-07-20 DIAGNOSIS — M1990 Unspecified osteoarthritis, unspecified site: Secondary | ICD-10-CM | POA: Diagnosis not present

## 2014-07-20 DIAGNOSIS — I10 Essential (primary) hypertension: Secondary | ICD-10-CM | POA: Diagnosis not present

## 2014-07-20 DIAGNOSIS — M542 Cervicalgia: Secondary | ICD-10-CM

## 2014-07-20 DIAGNOSIS — R51 Headache: Secondary | ICD-10-CM | POA: Diagnosis not present

## 2014-07-20 DIAGNOSIS — Z21 Asymptomatic human immunodeficiency virus [HIV] infection status: Secondary | ICD-10-CM | POA: Diagnosis not present

## 2014-07-20 DIAGNOSIS — Z880 Allergy status to penicillin: Secondary | ICD-10-CM | POA: Diagnosis not present

## 2014-07-20 DIAGNOSIS — Y9389 Activity, other specified: Secondary | ICD-10-CM | POA: Insufficient documentation

## 2014-07-20 DIAGNOSIS — Z8669 Personal history of other diseases of the nervous system and sense organs: Secondary | ICD-10-CM | POA: Diagnosis not present

## 2014-07-20 DIAGNOSIS — Y998 Other external cause status: Secondary | ICD-10-CM | POA: Diagnosis not present

## 2014-07-20 DIAGNOSIS — Y9289 Other specified places as the place of occurrence of the external cause: Secondary | ICD-10-CM | POA: Diagnosis not present

## 2014-07-20 DIAGNOSIS — Z8619 Personal history of other infectious and parasitic diseases: Secondary | ICD-10-CM | POA: Diagnosis not present

## 2014-07-20 DIAGNOSIS — S199XXA Unspecified injury of neck, initial encounter: Secondary | ICD-10-CM | POA: Insufficient documentation

## 2014-07-20 DIAGNOSIS — Z79899 Other long term (current) drug therapy: Secondary | ICD-10-CM | POA: Insufficient documentation

## 2014-07-20 DIAGNOSIS — Z87891 Personal history of nicotine dependence: Secondary | ICD-10-CM | POA: Diagnosis not present

## 2014-07-20 DIAGNOSIS — X58XXXA Exposure to other specified factors, initial encounter: Secondary | ICD-10-CM | POA: Insufficient documentation

## 2014-07-20 MED ORDER — METHOCARBAMOL 500 MG PO TABS: 500.0000 mg | ORAL_TABLET | Freq: Two times a day (BID) | ORAL | Status: DC

## 2014-07-20 MED ORDER — CYCLOBENZAPRINE HCL 10 MG PO TABS: 10.0000 mg | ORAL_TABLET | Freq: Two times a day (BID) | ORAL | Status: DC | PRN

## 2014-07-20 NOTE — ED Provider Notes (Signed)
CSN: [id number]     Arrival date & time 07/20/14  1106 History  This chart was scribed for Al Corpus, PA-C working with No att. providers found by Mercy Moore, ED Scribe. This patient was seen in room TR06C/TR06C and the patient's care was started at 11:39 AM.   Chief Complaint  Patient presents with  . Neck Pain   The history is provided by the patient. No language interpreter was used.   HPI Comments: Vincent Young is a 49 y.o. male with PMHx of HIV, HSV, arthritis and Hypertension who presents to the Emergency Department complaining of right lateral neck pain onset last night after lifting weights. Patient reports radiation of pain into right occipal head, right jaw and right eye. Patient suspects he has pulled a muscle. Patient reports treatment of pain with 3 hydrocodone this morning, he denies relief. Patient states that he has been taking hydrocodone for one year since his knee surgery.  Patient denies visual disturbance, fever, chills, or congestion. CD4 count 830 in12/2015 and less than 20 HIV RNA.    Past Medical History  Diagnosis Date  . HIV infection   . Recurrent genital HSV (herpes simplex virus) infection   . Pilonidal cyst   . Arthritis   . Sleep apnea     never tested-has s/s  . Hypertension    Past Surgical History  Procedure Laterality Date  . Peg tube placement  2009  . Uvulectomy  2009  . Tonsillectomy    . Hernia repair      inguinal as a child  . 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 Heart disease Father    History  Substance Use Topics  . Smoking status: Former Smoker    Quit date: 12/04/2003  . Smokeless tobacco: Never Used      Comment: pt. no longer smokes  . Alcohol Use: 0.0 oz/week    0 Standard drinks or equivalent per week     Comment: occasional    Review of Systems  Constitutional: Negative for fever and chills.  HENT: Negative for congestion.   Eyes: Positive for pain. Negative for visual disturbance.  Respiratory: Negative for cough.   Musculoskeletal: Positive for myalgias and neck pain.  Neurological: Positive for headaches. Negative for weakness and numbness.   Allergies  Other; Shellfish allergy; and Penicillins  Home Medications   Prior to Admission medications   Medication Sig Start Date End Date Taking? Authorizing Provider  albuterol (PROVENTIL HFA;VENTOLIN HFA) 108 (90 BASE) MCG/ACT inhaler Inhale 1-2 puffs into the lungs every 6 (six) hours as needed for wheezing or shortness of breath. 05/15/14   Ezequiel Essex, MD  atazanavir (REYATAZ) 300 MG capsule Take 300 mg by mouth daily with breakfast.    Historical Provider, MD  diphenhydrAMINE (BENADRYL) 25 MG tablet Take 2 tablets (50 mg total) by mouth every 8 (eight) hours as needed (allergic reaction). 04/03/14   Hoy Morn, MD  dolutegravir (TIVICAY) 50 MG tablet Take 50 mg by mouth daily.    Historical Provider, MD  emtricitabine-tenofovir (TRUVADA) 200-300 MG per tablet Take 1 tablet by mouth daily.    Historical Provider, MD  meloxicam (MOBIC) 15 MG tablet Take 15 mg by  mouth daily as needed for pain.  10/05/12   Historical Provider, MD  methocarbamol (ROBAXIN) 500 MG tablet Take 1 tablet (500 mg total) by mouth 2 (two) times daily. 07/20/14   Pura Spice, PA-C  montelukast (SINGULAIR) 10 MG tablet Take 10 mg by mouth at bedtime.    Historical Provider, MD  NORVIR 100 MG TABS tablet TAKE 1 TABLET BY MOUTH EVERY DAY 05/23/14   Thayer Headings, MD  testosterone cypionate (DEPOTESTOTERONE CYPIONATE) 200 MG/ML injection Inject 200 mg into the muscle every 14 (fourteen) days.    Historical Provider, MD  valACYclovir (VALTREX) 500 MG  tablet Take 500 mg by mouth 2 (two) times daily.     Historical Provider, MD   Triage Vitals: BP 149/93 mmHg  Pulse 87  Temp(Src) 98.6 F (37 C)  Resp 18  Ht 6\' 1"  (1.854 m)  Wt 388 lb (175.996 kg)  BMI 51.20 kg/m2  SpO2 92% Physical Exam  Constitutional: He appears well-developed and well-nourished. No distress.  HENT:  Head: Normocephalic and atraumatic.  Right Ear: Tympanic membrane normal.  Left Ear: Tympanic membrane normal.  Eyes: Conjunctivae are normal. Right eye exhibits no discharge. Left eye exhibits no discharge.  Pulmonary/Chest: Effort normal. No respiratory distress.  Musculoskeletal:  No mid line neck tenderness No nuchal rigidity Tenderness to right neck and distribution of trapezius  Norma ROM of right shoulder with strength intact 2+ radial pulses equal bilaterally   Neurological: He is alert. Coordination normal.  5/5 strength in bilateral upper extremities  Skin: He is not diaphoretic.  Psychiatric: He has a normal mood and affect. His behavior is normal.  Nursing note and vitals reviewed.   ED Course  Procedures (including critical care time)  COORDINATION OF CARE: 11:45 AM- Discussed treatment plan with patient at bedside and patient agreed to plan.   Labs Review Labs Reviewed - No data to display  Imaging Review No results found.   EKG Interpretation None      MDM   Final diagnoses:  Neck pain   Pt presenting with neck pain after lifting weights. VSS. Normal neurological exam. No midline tenderness. Suspect MSK strain. Pt given script for robaxin. Driving and sedation precautions provided. Pt to follow up with PCP.  Discussed return precautions with patient. Discussed all results and patient verbalizes understanding and agrees with plan.  I personally performed the services described in this documentation, which was scribed in my presence. The recorded information has been reviewed and is accurate.   Pura Spice,  PA-C 07/22/14 [place], MD 07/27/14 [phone]

## 2014-07-20 NOTE — Discharge Instructions (Signed)
Return to the emergency room with worsening of symptoms, new symptoms or with symptoms that are concerning, especially redness, swelling, numbness, tingling, weakness in bilateral upper arms. RICE: Rest, Ice (three cycles of 20 mins on, 65mins off at least twice a day), compression/brace, elevation. Heating pad works well for back pain. Robaxin for severe pain. Do not operate machinery, drive or drink alcohol while taking narcotics or muscle relaxers. Follow up with PCP if symptoms worsen or are persistent. Read below information and follow recommendations.   Cervical Sprain A cervical sprain is an injury in the neck in which the strong, fibrous tissues (ligaments) that connect your neck bones stretch or tear. Cervical sprains can range from mild to severe. Severe cervical sprains can cause the neck vertebrae to be unstable. This can lead to damage of the spinal cord and can result in serious nervous system problems. The amount of time it takes for a cervical sprain to get better depends on the cause and extent of the injury. Most cervical sprains heal in 1 to 3 weeks. CAUSES  Severe cervical sprains may be caused by:   Contact sport injuries (such as from football, rugby, wrestling, hockey, auto racing, gymnastics, diving, martial arts, or boxing).   Motor vehicle collisions.   Whiplash injuries. This is an injury from a sudden forward and backward whipping movement of the head and neck.  Falls.  Mild cervical sprains may be caused by:   Being in an awkward position, such as while cradling a telephone between your ear and shoulder.   Sitting in a chair that does not offer proper support.   Working at a poorly Landscape [employer] station.   Looking up or down for long periods of time.  SYMPTOMS   Pain, soreness, stiffness, or a burning sensation in the front, back, or sides of the neck. This discomfort may develop immediately after the injury or slowly, 24 hours or more after the  injury.   Pain or tenderness directly in the middle of the back of the neck.   Shoulder or upper back pain.   Limited ability to move the neck.   Headache.   Dizziness.   Weakness, numbness, or tingling in the hands or arms.   Muscle spasms.   Difficulty swallowing or chewing.   Tenderness and swelling of the neck.  DIAGNOSIS  Most of the time your health care provider can diagnose a cervical sprain by taking your history and doing a physical exam. Your health care provider will ask about previous neck injuries and any known neck problems, such as arthritis in the neck. X-rays may be taken to find out if there are any other problems, such as with the bones of the neck. Other tests, such as a CT scan or MRI, may also be needed.  TREATMENT  Treatment depends on the severity of the cervical sprain. Mild sprains can be treated with rest, keeping the neck in place (immobilization), and pain medicines. Severe cervical sprains are immediately immobilized. Further treatment is done to help with pain, muscle spasms, and other symptoms and may include:  Medicines, such as pain relievers, numbing medicines, or muscle relaxants.   Physical therapy. This may involve stretching exercises, strengthening exercises, and posture training. Exercises and improved posture can help stabilize the neck, strengthen muscles, and help stop symptoms from returning.  HOME CARE INSTRUCTIONS   Put ice on the injured area.   Put ice in a plastic bag.   Place a towel between your skin and  the bag.   Leave the ice on for 15-20 minutes, 3-4 times a day.   If your injury was severe, you may have been given a cervical collar to wear. A cervical collar is a two-piece collar designed to keep your neck from moving while it heals.  Do not remove the collar unless instructed by your health care provider.  If you have long hair, keep it outside of the collar.  Ask your health care provider before  making any adjustments to your collar. Minor adjustments may be required over time to improve comfort and reduce pressure on your chin or on the back of your head.  Ifyou are allowed to remove the collar for cleaning or bathing, follow your health care provider's instructions on how to do so safely.  Keep your collar clean by wiping it with mild soap and water and drying it completely. If the collar you have been given includes removable pads, remove them every 1-2 days and hand wash them with soap and water. Allow them to air dry. They should be completely dry before you wear them in the collar.  If you are allowed to remove the collar for cleaning and bathing, wash and dry the skin of your neck. Check your skin for irritation or sores. If you see any, tell your health care provider.  Do not drive while wearing the collar.   Only take over-the-counter or prescription medicines for pain, discomfort, or fever as directed by your health care provider.   Keep all follow-up appointments as directed by your health care provider.   Keep all physical therapy appointments as directed by your health care provider.   Make any needed adjustments to your workstation to promote good posture.   Avoid positions and activities that make your symptoms worse.   Warm up and stretch before being active to help prevent problems.  SEEK MEDICAL CARE IF:   Your pain is not controlled with medicine.   You are unable to decrease your pain medicine over time as planned.   Your activity level is not improving as expected.  SEEK IMMEDIATE MEDICAL CARE IF:   You develop any bleeding.  You develop stomach upset.  You have signs of an allergic reaction to your medicine.   Your symptoms get worse.   You develop new, unexplained symptoms.   You have numbness, tingling, weakness, or paralysis in any part of your body.  MAKE SURE YOU:   Understand these instructions.  Will watch your  condition.  Will get help right away if you are not doing well or get worse. Document Released: 03/03/2007 Document Revised: 05/11/2013 Document Reviewed: 11/11/2012 [facility] Patient Information 2015 [place], [place]. This information is not intended to replace advice given to you by your health care provider. Make sure you discuss any questions you have with your health care provider.

## 2014-07-20 NOTE — ED Notes (Signed)
**  PATIENT DID DRIVE HERE TODAY**

## 2014-07-20 NOTE — ED Notes (Signed)
Pt. Reports neck pain that started last night around 2100 after lifting weights. States pain "feels like it is in the muscle". Radiates up to side of head. Denies HA, blurred vision, dizziness. Took 3 hydrocodone this AM with no relief. Alert and oriented x4.

## 2014-07-28 ENCOUNTER — Other Ambulatory Visit: Payer: Self-pay | Admitting: Internal Medicine

## 2014-08-26 ENCOUNTER — Other Ambulatory Visit: Payer: Self-pay | Admitting: Gastroenterology

## 2014-08-26 ENCOUNTER — Encounter (HOSPITAL_COMMUNITY): Payer: Self-pay | Admitting: *Deleted

## 2014-08-26 NOTE — Addendum Note (Signed)
Addended byClarene Essex on: 08/26/2014 03:26 PM   Modules accepted: Orders

## 2014-08-26 NOTE — Progress Notes (Signed)
   08/26/14 1214  [place]  Have you ever been diagnosed with sleep apnea through a sleep study? No  Do you snore loudly (loud enough to be heard through closed doors)?  1  Do you often feel tired, fatigued, or sleepy during the daytime? 0  Has anyone observed you stop breathing during your sleep? 1  Do you have, or are you being treated for high blood pressure? 0  BMI more than 35 kg/m2? 1  Age over 51 years old? 1  Gender: 1

## 2014-08-27 ENCOUNTER — Other Ambulatory Visit: Payer: Self-pay | Admitting: Internal Medicine

## 2014-08-29 ENCOUNTER — Ambulatory Visit (HOSPITAL_COMMUNITY)
Admission: RE | Admit: 2014-08-29 | Discharge: 2014-08-29 | Disposition: A | Discharge status: Home / Self Care | Payer: Medicare Other | Source: Ambulatory Visit | Attending: Gastroenterology | Admitting: Gastroenterology

## 2014-08-29 ENCOUNTER — Encounter (HOSPITAL_COMMUNITY): Payer: Self-pay

## 2014-08-29 ENCOUNTER — Ambulatory Visit (HOSPITAL_COMMUNITY): Payer: Medicare Other | Admitting: Anesthesiology

## 2014-08-29 ENCOUNTER — Encounter (HOSPITAL_COMMUNITY)
Admission: RE | Disposition: A | Discharge status: Home / Self Care | Payer: Self-pay | Source: Ambulatory Visit | Attending: Gastroenterology

## 2014-08-29 DIAGNOSIS — Z9049 Acquired absence of other specified parts of digestive tract: Secondary | ICD-10-CM | POA: Diagnosis not present

## 2014-08-29 DIAGNOSIS — Z87891 Personal history of nicotine dependence: Secondary | ICD-10-CM | POA: Diagnosis not present

## 2014-08-29 DIAGNOSIS — J45909 Unspecified asthma, uncomplicated: Secondary | ICD-10-CM | POA: Insufficient documentation

## 2014-08-29 DIAGNOSIS — Z21 Asymptomatic human immunodeficiency virus [HIV] infection status: Secondary | ICD-10-CM | POA: Diagnosis not present

## 2014-08-29 DIAGNOSIS — I10 Essential (primary) hypertension: Secondary | ICD-10-CM | POA: Diagnosis not present

## 2014-08-29 DIAGNOSIS — Z6841 Body Mass Index (BMI) 40.0 and over, adult: Secondary | ICD-10-CM | POA: Diagnosis not present

## 2014-08-29 DIAGNOSIS — Z1211 Encounter for screening for malignant neoplasm of colon: Secondary | ICD-10-CM | POA: Insufficient documentation

## 2014-08-29 DIAGNOSIS — Z86010 Personal history of colonic polyps: Secondary | ICD-10-CM | POA: Diagnosis present

## 2014-08-29 DIAGNOSIS — K5730 Diverticulosis of large intestine without perforation or abscess without bleeding: Secondary | ICD-10-CM | POA: Diagnosis not present

## 2014-08-29 DIAGNOSIS — G4730 Sleep apnea, unspecified: Secondary | ICD-10-CM | POA: Diagnosis not present

## 2014-08-29 DIAGNOSIS — G629 Polyneuropathy, unspecified: Secondary | ICD-10-CM | POA: Diagnosis not present

## 2014-08-29 HISTORY — DX: Family history of other specified conditions: Z84.89

## 2014-08-29 HISTORY — DX: Acute kidney failure, unspecified: N17.9

## 2014-08-29 HISTORY — DX: Headache: R51

## 2014-08-29 HISTORY — PX: COLONOSCOPY WITH PROPOFOL: SHX5780

## 2014-08-29 HISTORY — DX: Unspecified asthma, uncomplicated: J45.909

## 2014-08-29 HISTORY — DX: Headache, unspecified: R51.9

## 2014-08-29 HISTORY — DX: Unspecified osteoarthritis, unspecified site: M19.90

## 2014-08-29 HISTORY — DX: Gastro-esophageal reflux disease without esophagitis: K21.9

## 2014-08-29 HISTORY — DX: Contact with and (suspected) exposure to tuberculosis: Z20.1

## 2014-08-29 SURGERY — COLONOSCOPY WITH PROPOFOL
Anesthesia: Monitor Anesthesia Care

## 2014-08-29 MED ORDER — PROPOFOL INFUSION 10 MG/ML OPTIME
INTRAVENOUS | Status: DC | PRN
  Administered 2014-08-29: 100 ug/kg/min via INTRAVENOUS
  Administered 2014-08-29: 09:00:00 via INTRAVENOUS

## 2014-08-29 MED ORDER — LIDOCAINE HCL (CARDIAC) 20 MG/ML IV SOLN
INTRAVENOUS | Status: DC | PRN
  Administered 2014-08-29: 50 mg via INTRAVENOUS

## 2014-08-29 MED ORDER — LACTATED RINGERS IV SOLN
INTRAVENOUS | Status: DC
  Administered 2014-08-29: 08:00:00 via INTRAVENOUS

## 2014-08-29 MED ORDER — SODIUM CHLORIDE 0.9 % IV SOLN: INTRAVENOUS | Status: DC

## 2014-08-29 NOTE — Op Note (Signed)
[facility] [place], [id number]   COLONOSCOPY PROCEDURE REPORT     EXAM DATE: 08/29/2014  PATIENT NAME:      Vincent Young, Vincent Young           MR #:      [id number]  BIRTHDATE:       1966/05/12      VISIT #:     [id number]  ATTENDING:     Clarene Essex, MD     STATUS:     outpatient ASSISTANT:      Sharon Mt and Sheppard Plumber, Autumn  INDICATIONS:  The patient is a 49 yr old male here for a colonoscopy due to high risk patient with personal history of colonic polyps.  PROCEDURE PERFORMED:     Colonoscopy, diagnostic MEDICATIONS:     Propofol 540 mg IV  50 mg lidocaine ESTIMATED BLOOD LOSS:     None  CONSENT: The patient understands the risks and benefits of the procedure and understands that these risks include, but are not limited to: sedation, allergic reaction, infection, perforation and/or bleeding. Alternative means of evaluation and treatment include, among others: physical exam, x-rays, and/or surgical intervention. The patient elects to proceed with this endoscopic procedure.  DESCRIPTION OF PROCEDURE: During intra-op preparation period all mechanical & medical equipment was checked for proper function. Hand hygiene and appropriate measures for infection prevention was taken. After the risks, benefits and alternatives of the procedure were thoroughly explained, Informed consent was verified, confirmed and timeout was successfully executed by the treatment team. A digital exam revealed no abnormalities of the rectum. The [employer]ax Adult Colon [id number] endoscope was introduced through the anus and advanced to the cecum, which was identified by both the appendix and ileocecal valve. adequate The instrument was then slowly withdrawn as the colon was fully examined.Estimated blood loss is zero unless otherwise noted in this procedure report. the findings are recorded below      Retroflexed views revealed no abnormalities. The scope  was then completely withdrawn from the patient and the procedure terminated. SCOPE WITHDRAWAL TIME: see nurse's note    ADVERSE EVENTS:      There were no immediate complications.  IMPRESSIONS:1.     few sigmoid small diverticuli       2. Otherwise within normal limits to the cecum  RECOMMENDATIONS:     happy to see back when necessary otherwise recheck colonoscopy in 5 years RECALL:     5 years  _____________________________ Clarene Essex, MD eSigned:  Clarene Essex, MD 08/29/2014 9:21 AM   cc:  Gus Height, M.D.   CPT CODES: ICD CODES:  The ICD and CPT codes recommended by this software are interpretations from the data that the clinical staff has captured with the software.  The verification of the translation of this report to the ICD and CPT codes and modifiers is the sole responsibility of the health care institution and practicing physician where this report was generated.  [employer] will not be held responsible for the validity of the ICD and CPT codes included on this report.  AMA assumes no liability for data contained or not contained herein. CPT is a Designer, television/film set of the [employer].

## 2014-08-29 NOTE — Anesthesia Postprocedure Evaluation (Signed)
  Anesthesia Post-op Note  Patient: Vincent Young  Procedure(s) Performed: Procedure(s): COLONOSCOPY WITH PROPOFOL (N/A)  Patient Location: PACU  Anesthesia Type:MAC  Level of Consciousness: awake  Airway and Oxygen Therapy: Patient Spontanous Breathing  Post-op Pain: mild  Post-op Assessment: Post-op Vital signs reviewed  Post-op Vital Signs: Reviewed  Last Vitals:  Filed Vitals:   08/29/14 0949  BP: 171/124  Pulse: 93  Resp: 23    Complications: No apparent anesthesia complications

## 2014-08-29 NOTE — Anesthesia Preprocedure Evaluation (Addendum)
Anesthesia Evaluation  Patient identified by MRN, date of birth, ID band Patient awake    Reviewed: Allergy & Precautions, NPO status , Patient's Chart, lab work & pertinent test results  History of Anesthesia Complications Negative for: history of anesthetic complications  Airway Mallampati: I  TM Distance: >3 FB Neck ROM: Full    Dental  (+) Teeth Intact, Dental Advisory Given   Pulmonary asthma , sleep apnea , former smoker,  breath sounds clear to auscultation        Cardiovascular hypertension, Rhythm:Regular Rate:Normal     Neuro/Psych  Headaches, negative psych ROS   GI/Hepatic negative GI ROS, Neg liver ROS,   Endo/Other  Morbid obesity  Renal/GU      Musculoskeletal   Abdominal   Peds  Hematology   Anesthesia Other Findings   Reproductive/Obstetrics negative OB ROS                            Anesthesia Physical Anesthesia Plan  ASA: III  Anesthesia Plan: MAC   Post-op Pain Management:    Induction: Intravenous  Airway Management Planned: Simple Face Mask  Additional Equipment:   Intra-op Plan:   Post-operative Plan:   Informed Consent:   Dental advisory given  Plan Discussed with: CRNA  Anesthesia Plan Comments:         Anesthesia Quick Evaluation

## 2014-08-29 NOTE — Anesthesia Procedure Notes (Signed)
Procedure Name: MAC Date/Time: 08/29/2014 8:48 AM Performed by: Jenne Campus Pre-anesthesia Checklist: Patient identified, Emergency Drugs available, Suction available, Patient being monitored and Timeout performed Patient Re-evaluated:Patient Re-evaluated prior to inductionOxygen Delivery Method: Simple face mask

## 2014-08-29 NOTE — Discharge Instructions (Addendum)
Call if question or problem otherwise follow-up as needed or repeat colonoscopy in 5 yearsColonoscopy, Care After These instructions give you information on caring for yourself after your procedure. Your doctor may also give you more specific instructions. Call your doctor if you have any problems or questions after your procedure. HOME CARE  Do not drive for 24 hours.  Do not sign important papers or use machinery for 24 hours.  You may shower.  You may go back to your usual activities, but go slower for the first 24 hours.  Take rest breaks often during the first 24 hours.  Walk around or use warm packs on your belly (abdomen) if you have belly cramping or gas.  Drink enough fluids to keep your pee (urine) clear or pale yellow.  Resume your normal diet. Avoid heavy or fried foods.  Avoid drinking alcohol for 24 hours or as told by your doctor.  Only take medicines as told by your doctor. If a tissue sample (biopsy) was taken during the procedure:   Do not take aspirin or blood thinners for 7 days, or as told by your doctor.  Do not drink alcohol for 7 days, or as told by your doctor.  Eat soft foods for the first 24 hours. GET HELP IF: You still have a small amount of blood in your poop (stool) 2-3 days after the procedure. GET HELP RIGHT AWAY IF:  You have more than a small amount of blood in your poop.  You see clumps of tissue (blood clots) in your poop.  Your belly is puffy (swollen).  You feel sick to your stomach (nauseous) or throw up (vomit).  You have a fever.  You have belly pain that gets worse and medicine does not help. MAKE SURE YOU:  Understand these instructions.  Will watch your condition.  Will get help right away if you are not doing well or get worse. Document Released: 06/08/2010 Document Revised: 05/11/2013 Document Reviewed: 01/11/2013 [facility] Patient Information 2015 [place], [place]. This information is not intended to replace advice  given to you by your health care provider. Make sure you discuss any questions you have with your health care provider.

## 2014-08-29 NOTE — Progress Notes (Signed)
Garth Schlatter 8:39 AM  Subjective: Patient asymptomatic from a GI standpoint and is history was reviewed and has not had any new medical problems since she was seen in our office and he has no other complaints  Objective: Vital signs stable afebrile exam please see preassessment evaluation  Assessment: Colonic Screening  Plan: Okay to proceed with colonoscopy with anesthesia assistance  [facility]

## 2014-08-29 NOTE — Transfer of Care (Signed)
Immediate Anesthesia Transfer of Care Note  Patient: Vincent Young  Procedure(s) Performed: Procedure(s): COLONOSCOPY WITH PROPOFOL (N/A)  Patient Location: Endoscopy Unit  Anesthesia Type:MAC  Level of Consciousness: awake, oriented and patient cooperative  Airway & Oxygen Therapy: Patient Spontanous Breathing and Patient connected to face mask oxygen  Post-op Assessment: Report given to RN and Post -op Vital signs reviewed and stable  Post vital signs: Reviewed and stable  Last Vitals:  Filed Vitals:   08/29/14 0740  BP: 173/118  Pulse: 97  Resp: 16    Complications: No apparent anesthesia complications

## 2014-08-30 ENCOUNTER — Encounter (HOSPITAL_COMMUNITY): Payer: Self-pay | Admitting: Gastroenterology

## 2014-09-23 ENCOUNTER — Other Ambulatory Visit: Payer: Self-pay | Admitting: Internal Medicine

## 2014-10-05 ENCOUNTER — Other Ambulatory Visit: Payer: Self-pay | Admitting: Internal Medicine

## 2014-10-23 ENCOUNTER — Other Ambulatory Visit: Payer: Self-pay | Admitting: Internal Medicine

## 2014-10-23 DIAGNOSIS — B20 Human immunodeficiency virus [HIV] disease: Secondary | ICD-10-CM

## 2014-11-22 ENCOUNTER — Telehealth: Payer: Self-pay | Admitting: *Deleted

## 2014-11-22 NOTE — Telephone Encounter (Signed)
Patient called asking if he was supposed to be taking 1000mg  valtrex for outbreaks.  He states he was told this at his last visit, but has only been getting #30/month.  Please advise if the rx should be 1000mg /day. Patient also overdue for follow up.  His wife is scheduled for 7/12 at 4:15 with Dr. Baxter Flattery, he would like to know if he can be on standby for that day as well with Dr. Linus Salmons.  Otherwise next available is 8/4. Please advise. Vincent Gandy, RN

## 2014-11-22 NOTE — Telephone Encounter (Signed)
For prevention of outbreaks, it is 1000 mg once a day.  If it is for treatment when outbreaks occur it is 1000mg  tid for 5 days.  Whichever he prefers.  I can see him on the 12th when he comes in too if he prefers. thanks

## 2014-11-24 ENCOUNTER — Other Ambulatory Visit: Payer: Self-pay | Admitting: *Deleted

## 2014-11-24 ENCOUNTER — Other Ambulatory Visit (HOSPITAL_COMMUNITY)
Admission: RE | Admit: 2014-11-24 | Discharge: 2014-11-24 | Disposition: A | Discharge status: Home / Self Care | Payer: Medicare Other | Source: Ambulatory Visit | Attending: Internal Medicine | Admitting: Internal Medicine

## 2014-11-24 ENCOUNTER — Other Ambulatory Visit: Payer: Medicare Other

## 2014-11-24 DIAGNOSIS — Z79899 Other long term (current) drug therapy: Secondary | ICD-10-CM

## 2014-11-24 DIAGNOSIS — B009 Herpesviral infection, unspecified: Secondary | ICD-10-CM

## 2014-11-24 DIAGNOSIS — Z113 Encounter for screening for infections with a predominantly sexual mode of transmission: Secondary | ICD-10-CM

## 2014-11-24 DIAGNOSIS — B20 Human immunodeficiency virus [HIV] disease: Secondary | ICD-10-CM

## 2014-11-24 LAB — LIPID PANEL
Cholesterol: 180 mg/dL (ref 0–200)
HDL: 42 mg/dL (ref >=40)
LDL Cholesterol: 121 mg/dL — ABNORMAL HIGH (ref 0–99)
Total CHOL/HDL Ratio: 4.3 Ratio
Triglycerides: 86 mg/dL (ref <150)
VLDL: 17 mg/dL (ref 0–40)

## 2014-11-24 LAB — COMPLETE METABOLIC PANEL WITH GFR
ALT: 16 U/L (ref 0–53)
AST: 16 U/L (ref 0–37)
Albumin: 3.5 g/dL (ref 3.5–5.2)
Alkaline Phosphatase: 108 U/L (ref 39–117)
BUN: 14 mg/dL (ref 6–23)
CO2: 28 mEq/L (ref 19–32)
Calcium: 9.2 mg/dL (ref 8.4–10.5)
Chloride: 101 mEq/L (ref 96–112)
Creat: 1.21 mg/dL (ref 0.50–1.35)
GFR, Est African American: 80 mL/min
GFR, Est Non African American: 69 mL/min
Glucose, Bld: 112 mg/dL — ABNORMAL HIGH (ref 70–99)
Potassium: 5.2 mEq/L (ref 3.5–5.3)
Sodium: 142 mEq/L (ref 135–145)
Total Bilirubin: 1.6 mg/dL — ABNORMAL HIGH (ref 0.2–1.2)
Total Protein: 6.4 g/dL (ref 6.0–8.3)

## 2014-11-24 LAB — CBC WITH DIFFERENTIAL/PLATELET
Basophils Absolute: 0 10*3/uL (ref 0.0–0.1)
Basophils Relative: 0 % (ref 0–1)
Eosinophils Absolute: 0.2 10*3/uL (ref 0.0–0.7)
Eosinophils Relative: 2 % (ref 0–5)
HCT: 43.5 % (ref 39.0–52.0)
Hemoglobin: 14.6 g/dL (ref 13.0–17.0)
Lymphocytes Relative: 34 % (ref 12–46)
Lymphs Abs: 3.1 10*3/uL (ref 0.7–4.0)
MCH: 31.1 pg (ref 26.0–34.0)
MCHC: 33.6 g/dL (ref 30.0–36.0)
MCV: 92.6 fL (ref 78.0–100.0)
MPV: 8.7 fL (ref 8.6–12.4)
Monocytes Absolute: 0.6 10*3/uL (ref 0.1–1.0)
Monocytes Relative: 7 % (ref 3–12)
Neutro Abs: 5.1 10*3/uL (ref 1.7–7.7)
Neutrophils Relative %: 57 % (ref 43–77)
Platelets: 277 10*3/uL (ref 150–400)
RBC: 4.7 MIL/uL (ref 4.22–5.81)
RDW: 15 % (ref 11.5–15.5)
WBC: 9 10*3/uL (ref 4.0–10.5)

## 2014-11-24 LAB — HEPATITIS A ANTIBODY, TOTAL: Hep A Total Ab: NONREACTIVE

## 2014-11-24 MED ORDER — VALACYCLOVIR HCL 500 MG PO TABS: 1000.0000 mg | ORAL_TABLET | Freq: Every day | ORAL | Status: DC

## 2014-11-24 NOTE — Telephone Encounter (Signed)
Patient will come today for labs, is worked in on 7/12 at 4:00.  Refilled the valtrex at 1,000 mg daily per patient request.  Thank you!

## 2014-11-25 LAB — RPR

## 2014-11-25 LAB — URINE CYTOLOGY ANCILLARY ONLY
Chlamydia: NEGATIVE
Neisseria Gonorrhea: NEGATIVE

## 2014-11-25 LAB — T-HELPER CELL (CD4) - (RCID CLINIC ONLY)
CD4 % Helper T Cell: 21 % — ABNORMAL LOW (ref 33–55)
CD4 T Cell Abs: 610 /uL (ref 400–2700)

## 2014-11-25 LAB — HEPATITIS B SURFACE ANTIBODY,QUALITATIVE: Hep B S Ab: POSITIVE — AB

## 2014-11-25 LAB — HEPATITIS B CORE ANTIBODY, TOTAL: Hep B Core Total Ab: NONREACTIVE

## 2014-11-26 LAB — HIV-1 RNA QUANT-NO REFLEX-BLD
HIV 1 RNA Quant: <20 copies/mL (ref <20)
HIV-1 RNA Quant, Log: <1.3 {Log} (ref <1.30)

## 2014-11-29 ENCOUNTER — Encounter: Payer: Self-pay | Admitting: Internal Medicine

## 2014-11-29 ENCOUNTER — Ambulatory Visit (INDEPENDENT_AMBULATORY_CARE_PROVIDER_SITE_OTHER): Payer: Medicare Other | Admitting: Internal Medicine

## 2014-11-29 VITALS — BP 138/93 | HR 83 | Temp 97.8°F | Wt 378.0 lb

## 2014-11-29 DIAGNOSIS — B20 Human immunodeficiency virus [HIV] disease: Secondary | ICD-10-CM

## 2014-11-29 NOTE — Assessment & Plan Note (Addendum)
Doing well.  May consider streamlining regimen once Descovy is covered to Prezcobix, Tivicay Descovy or Prezcobix and Triumeq.  Will check HLA next lab visit.

## 2014-11-29 NOTE — Progress Notes (Signed)
  Subjective:    Patient ID: Vincent Young, male    DOB: 10/28/1965, 49 y.o.   MRN: [id number]  HPI He comes in for routine followup. He continues on Tivicay, Reyataz, Norvir and Truvada. He feels well and since his last visit he endorses complete compliance with his medications. His viral load is undetectable and his CD4 count is 610. He has no complaints. No weight loss, no diarrhea.  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  Now on it bid.      Review of Systems  Constitutional: Negative for fatigue and unexpected weight change.  HENT: Negative for trouble swallowing.   Eyes: Negative for visual disturbance.  Gastrointestinal: Negative for nausea, abdominal pain and diarrhea.  Skin: Negative for rash.  Neurological: Negative for dizziness, light-headedness and headaches.       Objective:   Physical Exam  Constitutional: He appears well-developed and well-nourished. No distress.  HENT:  Mouth/Throat: No oropharyngeal exudate.  Eyes: No scleral icterus.  Cardiovascular: Normal rate, regular rhythm and normal heart sounds.   No murmur heard. Pulmonary/Chest: Effort normal and breath sounds normal. No respiratory distress.  Lymphadenopathy:    He has no cervical adenopathy.  Skin: No rash noted.          Assessment & Plan:

## 2014-12-13 ENCOUNTER — Encounter (INDEPENDENT_AMBULATORY_CARE_PROVIDER_SITE_OTHER): Payer: Self-pay | Admitting: *Deleted

## 2014-12-13 VITALS — BP 136/92 | HR 80 | Temp 98.4°F | Resp 16 | Ht 72.8 in | Wt 371.2 lb

## 2014-12-13 DIAGNOSIS — Z006 Encounter for examination for normal comparison and control in clinical research program: Secondary | ICD-10-CM

## 2014-12-13 LAB — LIPID PANEL
Cholesterol: 200 mg/dL (ref 125–200)
HDL: 43 mg/dL (ref >=40)
LDL Cholesterol: 137 mg/dL — ABNORMAL HIGH (ref <130)
Total CHOL/HDL Ratio: 4.7 Ratio (ref <=5.0)
Triglycerides: 100 mg/dL (ref <150)
VLDL: 20 mg/dL (ref <30)

## 2014-12-13 NOTE — Progress Notes (Signed)
Vincent Young is here for screening for the Reprieve study. Informed consent was obtained after he had time to review the consent and we discussed it in detail. 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 He recently saw Dr. Linus Salmons for his routine exam and had labs drawn on 7/7. We repeated the lipid today to verify fasting status. His only current complaint is bilateral knee pain from where he had fallen at work last month. If he is deemed eligible, we have entry planned for August 8th.

## 2014-12-24 ENCOUNTER — Other Ambulatory Visit: Payer: Self-pay | Admitting: Internal Medicine

## 2014-12-28 ENCOUNTER — Encounter (INDEPENDENT_AMBULATORY_CARE_PROVIDER_SITE_OTHER): Payer: Medicare Other | Admitting: *Deleted

## 2014-12-28 ENCOUNTER — Encounter: Payer: Self-pay | Admitting: *Deleted

## 2014-12-28 VITALS — BP 142/92 | HR 83 | Temp 98.3°F | Resp 18 | Wt 370.2 lb

## 2014-12-28 DIAGNOSIS — Z006 Encounter for examination for normal comparison and control in clinical research program: Secondary | ICD-10-CM

## 2014-12-28 NOTE — Progress Notes (Signed)
Q3335 - Randomized Trial to prevent Vascular Events in HIV (REPRIEVE study). Study Drug taking is Pitivastatin/placebo 4mg  PO Daily.   Vincent Young is here for [id number], entry visit. I confirmed with him his willingness to participate in study. I entered him on study. We reviewed medical history, medications, and symptoms and he denied any changes. Vital signs were obtained and BP is a little elevated from last study visit. Fasting blood was drawn and urine obtained. ECG was completed and findings are NSR/normal ECG. Study medication was dispensed and we reviewed how to administer, what symptoms to look for especially muscle aches/weakness. He verbalized understanding. He received $50 gift card for visit. Next appointment in a month. Eliezer Champagne RN

## 2015-01-21 ENCOUNTER — Other Ambulatory Visit: Payer: Self-pay | Admitting: Internal Medicine

## 2015-01-21 DIAGNOSIS — B20 Human immunodeficiency virus [HIV] disease: Secondary | ICD-10-CM

## 2015-01-24 ENCOUNTER — Encounter (INDEPENDENT_AMBULATORY_CARE_PROVIDER_SITE_OTHER): Payer: Medicare Other | Admitting: *Deleted

## 2015-01-24 VITALS — BP 156/101 | HR 84 | Temp 98.8°F | Resp 16 | Wt 374.0 lb

## 2015-01-24 DIAGNOSIS — Z006 Encounter for examination for normal comparison and control in clinical research program: Secondary | ICD-10-CM

## 2015-01-24 LAB — COMPREHENSIVE METABOLIC PANEL
ALT: 16 U/L (ref 9–46)
AST: 17 U/L (ref 10–35)
Albumin: 3.7 g/dL (ref 3.6–5.1)
Alkaline Phosphatase: 113 U/L (ref 40–115)
BUN: 16 mg/dL (ref 7–25)
CO2: 26 mmol/L (ref 20–31)
Calcium: 8.9 mg/dL (ref 8.6–10.3)
Chloride: 104 mmol/L (ref 98–110)
Creat: 1.12 mg/dL (ref 0.70–1.33)
Glucose, Bld: 111 mg/dL — ABNORMAL HIGH (ref 65–99)
Potassium: 5 mmol/L (ref 3.5–5.3)
Sodium: 142 mmol/L (ref 135–146)
Total Bilirubin: 1.4 mg/dL — ABNORMAL HIGH (ref 0.2–1.2)
Total Protein: 6.9 g/dL (ref 6.1–8.1)

## 2015-01-24 NOTE — Progress Notes (Signed)
Vincent Young is here for his 1 month reprieve study visit. He says he started having some thigh and knee muscular pain after about 1 and half weeks of starting the pitavastatin. He stopped it about 4 days ago to see if it would go away and he says it has. He says it was tolerable and is willing to start it back up again. He hasn't taken his testosterone in about 3 weeks and says he is going to stop it. He was instructed to stop the medication is the muscle aches are intolerable and let us know right away. He returns in 3 months for the next study visit.

## 2015-03-20 ENCOUNTER — Emergency Department (HOSPITAL_COMMUNITY)
Admission: EM | Admit: 2015-03-20 | Discharge: 2015-03-20 | Disposition: A | Discharge status: Home / Self Care | Payer: Medicare Other | Attending: Emergency Medicine | Admitting: Emergency Medicine

## 2015-03-20 ENCOUNTER — Encounter (HOSPITAL_COMMUNITY): Payer: Self-pay | Admitting: Emergency Medicine

## 2015-03-20 ENCOUNTER — Emergency Department (HOSPITAL_COMMUNITY): Payer: Medicare Other

## 2015-03-20 DIAGNOSIS — J45909 Unspecified asthma, uncomplicated: Secondary | ICD-10-CM | POA: Insufficient documentation

## 2015-03-20 DIAGNOSIS — I861 Scrotal varices: Secondary | ICD-10-CM | POA: Diagnosis not present

## 2015-03-20 DIAGNOSIS — Z872 Personal history of diseases of the skin and subcutaneous tissue: Secondary | ICD-10-CM | POA: Diagnosis not present

## 2015-03-20 DIAGNOSIS — Z87891 Personal history of nicotine dependence: Secondary | ICD-10-CM | POA: Diagnosis not present

## 2015-03-20 DIAGNOSIS — R109 Unspecified abdominal pain: Secondary | ICD-10-CM

## 2015-03-20 DIAGNOSIS — Z79899 Other long term (current) drug therapy: Secondary | ICD-10-CM | POA: Insufficient documentation

## 2015-03-20 DIAGNOSIS — I10 Essential (primary) hypertension: Secondary | ICD-10-CM | POA: Diagnosis not present

## 2015-03-20 DIAGNOSIS — Z8669 Personal history of other diseases of the nervous system and sense organs: Secondary | ICD-10-CM | POA: Insufficient documentation

## 2015-03-20 DIAGNOSIS — Z8719 Personal history of other diseases of the digestive system: Secondary | ICD-10-CM | POA: Insufficient documentation

## 2015-03-20 DIAGNOSIS — Z87448 Personal history of other diseases of urinary system: Secondary | ICD-10-CM | POA: Insufficient documentation

## 2015-03-20 DIAGNOSIS — I8390 Asymptomatic varicose veins of unspecified lower extremity: Secondary | ICD-10-CM

## 2015-03-20 DIAGNOSIS — R110 Nausea: Secondary | ICD-10-CM | POA: Insufficient documentation

## 2015-03-20 DIAGNOSIS — Z880 Allergy status to penicillin: Secondary | ICD-10-CM | POA: Diagnosis not present

## 2015-03-20 DIAGNOSIS — N5082 Scrotal pain: Secondary | ICD-10-CM | POA: Diagnosis present

## 2015-03-20 DIAGNOSIS — M1990 Unspecified osteoarthritis, unspecified site: Secondary | ICD-10-CM | POA: Insufficient documentation

## 2015-03-20 DIAGNOSIS — B20 Human immunodeficiency virus [HIV] disease: Secondary | ICD-10-CM | POA: Diagnosis not present

## 2015-03-20 LAB — LIPASE, BLOOD: Lipase: 146 U/L — ABNORMAL HIGH (ref 11–51)

## 2015-03-20 LAB — URINALYSIS, ROUTINE W REFLEX MICROSCOPIC
Bilirubin Urine: NEGATIVE
Glucose, UA: NEGATIVE mg/dL
Hgb urine dipstick: NEGATIVE
Ketones, ur: NEGATIVE mg/dL
Leukocytes, UA: NEGATIVE
Nitrite: NEGATIVE
Protein, ur: NEGATIVE mg/dL
Specific Gravity, Urine: 1.03 (ref 1.005–1.030)
Urobilinogen, UA: 0.2 mg/dL (ref 0.0–1.0)
pH: 5 (ref 5.0–8.0)

## 2015-03-20 LAB — COMPREHENSIVE METABOLIC PANEL
ALT: 26 U/L (ref 17–63)
AST: 23 U/L (ref 15–41)
Albumin: 3.5 g/dL (ref 3.5–5.0)
Alkaline Phosphatase: 111 U/L (ref 38–126)
Anion gap: 8 (ref 5–15)
BUN: 14 mg/dL (ref 6–20)
CO2: 29 mmol/L (ref 22–32)
Calcium: 9.7 mg/dL (ref 8.9–10.3)
Chloride: 106 mmol/L (ref 101–111)
Creatinine, Ser: 1.23 mg/dL (ref 0.61–1.24)
GFR calc Af Amer: >60 mL/min (ref >60)
GFR calc non Af Amer: >60 mL/min (ref >60)
Glucose, Bld: 106 mg/dL — ABNORMAL HIGH (ref 65–99)
Potassium: 5.1 mmol/L (ref 3.5–5.1)
Sodium: 143 mmol/L (ref 135–145)
Total Bilirubin: 2 mg/dL — ABNORMAL HIGH (ref 0.3–1.2)
Total Protein: 8 g/dL (ref 6.5–8.1)

## 2015-03-20 LAB — CBC
HCT: 41.6 % (ref 39.0–52.0)
Hemoglobin: 14.1 g/dL (ref 13.0–17.0)
MCH: 31.4 pg (ref 26.0–34.0)
MCHC: 33.9 g/dL (ref 30.0–36.0)
MCV: 92.7 fL (ref 78.0–100.0)
Platelets: 248 10*3/uL (ref 150–400)
RBC: 4.49 MIL/uL (ref 4.22–5.81)
RDW: 13.8 % (ref 11.5–15.5)
WBC: 9.1 10*3/uL (ref 4.0–10.5)

## 2015-03-20 NOTE — ED Provider Notes (Signed)
CSN: [id number]     Arrival date & time 03/20/15  1230 History   First MD Initiated Contact with Patient 03/20/15 1454     Chief Complaint  Patient presents with  . Testicle Pain  . bleeding from testicle      (Consider location/radiation/quality/duration/timing/severity/associated sxs/prior Treatment) HPI Comments: Patient here with scrotal bleeding. He was in the shower noticed pain from his scrotum. He has a small pinpoint area of bleeding his anterior scrotum. He denied any trauma, testicular pain, penile pain, dysuria. Patient also has been having right-sided abdominal pain for the past month. Described as feeling like he did too many crunches. He's had some nausea but only today. No vomiting, diarrhea, fever. He reports history of inguinal hernia but he has felt no masses or bulging in that area.  Patient is a 49 y.o. male presenting with testicular pain. The history is provided by the patient.  Testicle Pain This is a new problem. The current episode started 3 to 5 hours ago. The problem occurs constantly. The problem has not changed since onset.Associated symptoms include abdominal pain. Pertinent negatives include no shortness of breath. Nothing aggravates the symptoms. Nothing relieves the symptoms.    Past Medical History  Diagnosis Date  . HIV infection ([place])   . Recurrent genital HSV (herpes simplex virus) infection   . Pilonidal cyst   . Arthritis   . Sleep apnea     never tested-has s/s  . Hypertension   . Family history of adverse reaction to anesthesia     " My brother has had PONV"  . Asthma   . Exposure to TB   . Acute renal failure ([facility])     due to dehydration  . GERD (gastroesophageal reflux disease)   . Headache   . DJD (degenerative joint disease)    Past Surgical History  Procedure Laterality Date  . Peg tube placement  2009  . Uvulectomy  2009  . Tonsillectomy    . Hernia repair      inguinal as a child  . 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 Appendectomy    . Colonoscopy w/ biopsies and polypectomy    . Colonoscopy with propofol N/A 08/29/2014    Procedure: COLONOSCOPY WITH PROPOFOL;  Surgeon: Clarene Essex, MD;  Location[facility] ENDOSCOPY;  Service: Endoscopy;  Laterality: N/A;   Family History  Problem Relation Age of Onset  . Heart disease Father   . Hypertension Father   . Hypertension Mother   . Arthritis Mother   . Hypertension Sister   . Arthritis Sister   . Hypertension Brother    Social History  Substance Use Topics  . Smoking status: Former Smoker    Quit date: 12/04/2003  . Smokeless tobacco: Never Used     Comment: pt. no longer smokes  . Alcohol Use: 0.0 oz/week    0 Standard drinks or equivalent per week     Comment: occasional    Review of Systems  Constitutional: Negative for fever.  Respiratory: Negative for cough and shortness of breath.   Gastrointestinal: Positive for nausea and abdominal pain. Negative for vomiting.  Genitourinary: Positive for testicular pain.  All other systems reviewed and are negative.     Allergies  Other; Shellfish  allergy; and Penicillins  Home Medications   Prior to Admission medications   Medication Sig Start Date End Date Taking? Authorizing Provider  albuterol (PROVENTIL HFA;VENTOLIN HFA) 108 (90 BASE) MCG/ACT inhaler Inhale 1-2 puffs into the lungs every 6 (six) hours as needed for wheezing or shortness of breath. 05/15/14   Ezequiel Essex, MD  diphenhydrAMINE (BENADRYL) 25 MG tablet Take 2 tablets (50 mg total) by mouth every 8 (eight) hours as needed (allergic reaction). 04/03/14   Jola Schmidt, MD  meloxicam (MOBIC) 15 MG tablet Take 15 mg by mouth daily as needed for pain.  10/05/12   Historical  Provider, MD  methocarbamol (ROBAXIN) 500 MG tablet Take 1 tablet (500 mg total) by mouth 2 (two) times daily. 07/20/14   Al Corpus, PA-C  montelukast (SINGULAIR) 10 MG tablet Take 10 mg by mouth at bedtime.    Historical Provider, MD  NORVIR 100 MG TABS tablet TAKE 1 TABLET BY MOUTH EVERY DAY 01/24/15   Thayer Headings, MD  REYATAZ 300 MG capsule TAKE ONE CAPSULE BY MOUTH DAILY WITH BREAKFAST 01/24/15   Thayer Headings, MD  testosterone cypionate (DEPOTESTOTERONE CYPIONATE) 200 MG/ML injection Inject 200 mg into the muscle every 14 (fourteen) days.    Historical Provider, MD  TIVICAY 50 MG tablet TAKE 1 TABLET BY MOUTH EVERY DAY 01/24/15   Thayer Headings, MD  TRUVADA 200-300 MG per tablet TAKE 1 TABLET BY MOUTH DAILY 01/24/15   Thayer Headings, MD  valACYclovir (VALTREX) 500 MG tablet Take 2 tablets (1,000 mg total) by mouth daily. 11/24/14   Thayer Headings, MD   BP 125/85 mmHg  Pulse 77  Temp(Src) 98.7 F (37.1 C) (Oral)  Resp 16  Ht 6' (1.829 m)  Wt 367 lb 8 oz (166.697 kg)  BMI 49.83 kg/m2  SpO2 94% Physical Exam  Constitutional: He is oriented to person, place, and time. He appears well-developed and well-nourished. No distress.  HENT:  Head: Normocephalic and atraumatic.  Mouth/Throat: No oropharyngeal exudate.  Eyes: EOM are normal. Pupils are equal, round, and reactive to light.  Neck: Normal range of motion. Neck supple.  Cardiovascular: Normal rate and regular rhythm.  Exam reveals no friction rub.   No murmur heard. Pulmonary/Chest: Effort normal and breath sounds normal. No respiratory distress. He has no wheezes. He has no rales.  Abdominal: He exhibits no distension. There is tenderness (R flank). There is no rebound.  Genitourinary:     Musculoskeletal: Normal range of motion. He exhibits no edema.  Neurological: He is alert and oriented to person, place, and time.  Skin: He is not diaphoretic.  Nursing note and vitals reviewed.   ED Course  Procedures (including  critical care time) Labs Review Labs Reviewed  LIPASE, BLOOD - Abnormal; Notable for the following:    Lipase 146 (*)    All other components within normal limits  COMPREHENSIVE METABOLIC PANEL - Abnormal; Notable for the following:    Glucose, Bld 106 (*)    Total Bilirubin 2.0 (*)    All other components within normal limits  CBC  URINALYSIS, ROUTINE W REFLEX MICROSCOPIC (NOT AT [facility])    Imaging Review Ct Renal Stone Study  03/20/2015  CLINICAL DATA:  Right flank pain. EXAM: CT ABDOMEN AND PELVIS WITHOUT CONTRAST TECHNIQUE: Multidetector CT imaging of the abdomen and pelvis was performed following the standard protocol without IV contrast. COMPARISON:  01/24/2013 CT abdomen/pelvis . FINDINGS: Lower chest: No significant pulmonary nodules or acute consolidative airspace disease.  Hepatobiliary: Normal liver with no liver mass. Normal gallbladder with no radiopaque cholelithiasis. No biliary ductal dilatation. Pancreas: Normal, with no mass or duct dilation. Spleen: Normal size. No mass. Adrenals/Urinary Tract: Normal adrenals. Normal kidneys with no nephrolithiasis, no hydronephrosis and no contour-deforming renal mass. Normal bladder. Stomach/Bowel: There is stable curvilinear soft tissue between the anterior proximal body of the stomach and the ventral left abdominal muscle wall compared to the 06/09/2013 chest CT, likely representing scarring from prior percutaneous gastrostomy tube (series 201/ image 17). Otherwise grossly normal stomach. Normal caliber small bowel with no small bowel wall thickening. Status post appendectomy. Minimal sigmoid diverticulosis. No large bowel wall thickening or pericolonic fat stranding. Vascular/Lymphatic: Atherosclerotic nonaneurysmal abdominal aorta. No pathologically enlarged lymph nodes in the abdomen or pelvis. Reproductive: Normal size prostate. Other: No pneumoperitoneum, ascites or focal fluid collection. Musculoskeletal: No aggressive appearing focal  osseous lesions. Marked degenerative changes are present throughout the visualized thoracolumbar spine. There is a suggestion of a tiny fat containing left inguinal hernia. No appreciable right inguinal hernia. IMPRESSION: 1. No urolithiasis.  No evidence of urinary tract obstruction. 2. Minimal sigmoid diverticulosis. No evidence of bowel obstruction or acute bowel inflammation. 3. Suggestion of a stable tiny fat containing left inguinal hernia. Electronically Signed   By: Ilona Sorrel M.D.   On: 03/20/2015 16:10   I have personally reviewed and evaluated these images and lab results as part of my medical decision-making.   EKG Interpretation None      MDM   Final diagnoses:  Right flank pain    49 year old male here with small pinpoint area of bleeding on his left anterior scrotum. This is consistent with a varicose vein. Direct pressure applied. Instructed for him to wear supportive briefs and to be more mobile to help circulate blood. 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 I cannot appreciate any hernias in the right angle now. He does have mild right low flank pain. Will do a CT scan without contrast for possible hernia or kidney stone. Labs show mild elevation of lipase, patient does not have any symptoms of pancreatitis. CT shows small left inguinal fat-containing hernia. No large bowel containing hernia on either side. Patient stable for discharge.  Evelina Bucy, MD 03/20/15 [phone]

## 2015-03-20 NOTE — Discharge Instructions (Signed)
Varicose Veins Varicose veins are veins that have become enlarged and twisted. They are usually seen in the legs but can occur in other parts of the body as well. CAUSES This condition is the result of valves in the veins not working properly. Valves in the veins help to return blood from the leg to the heart. If these valves are damaged, blood flows backward and backs up into the veins in the leg near the skin. This causes the veins to become larger. RISK FACTORS People who are on their feet a lot, who are pregnant, or who are overweight are more likely to develop varicose veins. SIGNS AND SYMPTOMS  Bulging, twisted-appearing, bluish veins, most commonly found on the legs.  Leg pain or a feeling of heaviness. These symptoms may be worse at the end of the day.  Leg swelling.  Changes in skin color. DIAGNOSIS A health care provider can usually diagnose varicose veins by examining your legs. Your health care provider may also recommend an ultrasound of your leg veins. TREATMENT Most varicose veins can be treated at home.However, other treatments are available for people who have persistent symptoms or want to improve the cosmetic appearance of the varicose veins. These treatment options include:  Sclerotherapy. A solution is injected into the vein to close it off.  Laser treatment. A laser is used to heat the vein to close it off.  Radiofrequency vein ablation. An electrical current produced by radio waves is used to close off the vein.  Phlebectomy. The vein is surgically removed through small incisions made over the varicose vein.  Vein ligation and stripping. The vein is surgically removed through incisions made over the varicose vein after the vein has been tied (ligated). HOME CARE INSTRUCTIONS  Do not stand or sit in one position for long periods of time. Do not sit with your legs crossed. Rest with your legs raised during the day.  Wear compression stockings as directed by your  health care provider. These stockings help to prevent blood clots and reduce swelling in your legs.  Do not wear other tight, encircling garments around your legs, pelvis, or waist.  Walk as much as possible to increase blood flow.  Raise the foot of your bed at night with 2-inch blocks.  If you get a cut in the skin over the vein and the vein bleeds, lie down with your leg raised and press on it with a clean cloth until the bleeding stops. Then place a bandage (dressing) on the cut. See your health care provider if it continues to bleed. SEEK MEDICAL CARE IF:  The skin around your ankle starts to break down.  You have pain, redness, tenderness, or hard swelling in your leg over a vein.  You are uncomfortable because of leg pain.   This information is not intended to replace advice given to you by your health care provider. Make sure you discuss any questions you have with your health care provider.   Document Released: 02/13/2005 Document Revised: 05/27/2014 Document Reviewed: 09/21/2013 Elsevier Interactive Patient Education 2016 [employer]  

## 2015-03-20 NOTE — ED Notes (Signed)
PA at bedside.  Instructed RN to hold pressure on site.  Pressure held for 2 minutes, bleeding stopped.  Covered with gauze and paper tape

## 2015-03-20 NOTE — ED Notes (Addendum)
Pt from home for eval of testicle pain and bleeding that started this morning while showering, pt noted bright red blood and states is now wearing wife's pad due to bleeding. Pt also reports right lower quadrant intermittent abd pain. Pt denies any bleeding from actual penis, denies any injury. nad noted. Denies taking any blood thinner.

## 2015-03-30 ENCOUNTER — Encounter (INDEPENDENT_AMBULATORY_CARE_PROVIDER_SITE_OTHER): Payer: Medicare Other | Admitting: *Deleted

## 2015-03-30 VITALS — BP 145/95 | HR 77 | Temp 98.1°F | Resp 18 | Wt 370.0 lb

## 2015-03-30 DIAGNOSIS — Z006 Encounter for examination for normal comparison and control in clinical research program: Secondary | ICD-10-CM

## 2015-03-30 LAB — COMPREHENSIVE METABOLIC PANEL
ALT: 17 U/L (ref 9–46)
AST: 15 U/L (ref 10–35)
Albumin: 3.9 g/dL (ref 3.6–5.1)
Alkaline Phosphatase: 120 U/L — ABNORMAL HIGH (ref 40–115)
BUN: 15 mg/dL (ref 7–25)
CO2: 26 mmol/L (ref 20–31)
Calcium: 9.2 mg/dL (ref 8.6–10.3)
Chloride: 103 mmol/L (ref 98–110)
Creat: 1.03 mg/dL (ref 0.70–1.33)
Glucose, Bld: 99 mg/dL (ref 65–99)
Potassium: 4.2 mmol/L (ref 3.5–5.3)
Sodium: 135 mmol/L (ref 135–146)
Total Bilirubin: 2.1 mg/dL — ABNORMAL HIGH (ref 0.2–1.2)
Total Protein: 7.4 g/dL (ref 6.1–8.1)

## 2015-03-30 LAB — CK: Total CK: 246 U/L — ABNORMAL HIGH (ref 7–232)

## 2015-03-30 NOTE — Progress Notes (Signed)
Vincent Young is here for [phone] discontinuation visit.   He started taking Pitavastatin/Placebo on 12/28/2014. He was having symptoms of thigh and knee pain and stopped taking study drug on 01/20/2015. After talking with [place] about options he decided to restart them on 01/24/2015 since the pain was bearable and had resolved on 01/22/2015. He is here today because he started having similar symptoms and stopped taking them 03/16/2015. He stated that the symptoms resolved 3 days after he stopped taking the study drug. Questionnaires completed and fasting blood drawn. He received $50 gift card for visit. Eliezer Champagne RN

## 2015-05-06 ENCOUNTER — Emergency Department (HOSPITAL_COMMUNITY)
Admission: EM | Admit: 2015-05-06 | Discharge: 2015-05-06 | Disposition: A | Discharge status: Home / Self Care | Payer: Medicare Other | Attending: Emergency Medicine | Admitting: Emergency Medicine

## 2015-05-06 ENCOUNTER — Emergency Department (HOSPITAL_COMMUNITY): Payer: Medicare Other

## 2015-05-06 ENCOUNTER — Encounter (HOSPITAL_COMMUNITY): Payer: Self-pay | Admitting: Emergency Medicine

## 2015-05-06 DIAGNOSIS — M25571 Pain in right ankle and joints of right foot: Secondary | ICD-10-CM | POA: Insufficient documentation

## 2015-05-06 DIAGNOSIS — J45909 Unspecified asthma, uncomplicated: Secondary | ICD-10-CM | POA: Insufficient documentation

## 2015-05-06 DIAGNOSIS — Z8719 Personal history of other diseases of the digestive system: Secondary | ICD-10-CM | POA: Insufficient documentation

## 2015-05-06 DIAGNOSIS — Z872 Personal history of diseases of the skin and subcutaneous tissue: Secondary | ICD-10-CM | POA: Insufficient documentation

## 2015-05-06 DIAGNOSIS — Z880 Allergy status to penicillin: Secondary | ICD-10-CM | POA: Diagnosis not present

## 2015-05-06 DIAGNOSIS — M25471 Effusion, right ankle: Secondary | ICD-10-CM | POA: Diagnosis not present

## 2015-05-06 DIAGNOSIS — E669 Obesity, unspecified: Secondary | ICD-10-CM | POA: Insufficient documentation

## 2015-05-06 DIAGNOSIS — Z21 Asymptomatic human immunodeficiency virus [HIV] infection status: Secondary | ICD-10-CM | POA: Diagnosis not present

## 2015-05-06 DIAGNOSIS — Z8619 Personal history of other infectious and parasitic diseases: Secondary | ICD-10-CM | POA: Insufficient documentation

## 2015-05-06 DIAGNOSIS — Z79899 Other long term (current) drug therapy: Secondary | ICD-10-CM | POA: Insufficient documentation

## 2015-05-06 DIAGNOSIS — Z8669 Personal history of other diseases of the nervous system and sense organs: Secondary | ICD-10-CM | POA: Insufficient documentation

## 2015-05-06 DIAGNOSIS — Z87448 Personal history of other diseases of urinary system: Secondary | ICD-10-CM | POA: Insufficient documentation

## 2015-05-06 DIAGNOSIS — I10 Essential (primary) hypertension: Secondary | ICD-10-CM | POA: Diagnosis not present

## 2015-05-06 DIAGNOSIS — M1990 Unspecified osteoarthritis, unspecified site: Secondary | ICD-10-CM | POA: Diagnosis not present

## 2015-05-06 HISTORY — DX: Obesity, unspecified: E66.9

## 2015-05-06 NOTE — ED Provider Notes (Signed)
CSN: [id number]     Arrival date & time 05/06/15  1945 History  By signing my name below, I, Vincent Young, attest that this documentation has been prepared under the direction and in the presence of [employer], PA-C. Electronically Signed: Meriel Young, ED Scribe. 05/06/2015. 9:09 PM.  Chief Complaint  Patient presents with  . Ankle Pain   The history is provided by the patient. No language interpreter was used.   HPI Comments: Vincent Young is a 49 y.o. male who presents to the Emergency Department complaining of sudden onset, constant, mild, sharp pain in right ankle onset 1 day ago after stepping out of his vehicle. He notes he stepped flat onto pavement and did not roll his ankle at that time. He associates swelling to left ankle. The pt's pain is worse with ambulation and weight bearing. He has not tried any alleviating treatments for the pain but has taken motrin with moderate relief. Denies numbness, tingling or weakness. No new or worsening right knee pain.   Past Medical History  Diagnosis Date  . HIV infection ([facility])   . Recurrent genital HSV (herpes simplex virus) infection   . Pilonidal cyst   . Arthritis   . Sleep apnea     never tested-has s/s  . Hypertension   . Family history of adverse reaction to anesthesia     " My brother has had PONV"  . Asthma   . Exposure to TB   . Acute renal failure ([facility])     due to dehydration  . GERD (gastroesophageal reflux disease)   . Headache   . DJD (degenerative joint disease)   . Obesity    Past Surgical History  Procedure Laterality Date  . Peg tube placement  2009  . Uvulectomy  2009  . Tonsillectomy    . Hernia repair      inguinal as a child  . 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 Appendectomy    . Colonoscopy w/ biopsies and polypectomy    . Colonoscopy with propofol N/A 08/29/2014    Procedure: COLONOSCOPY WITH PROPOFOL;  Surgeon: Clarene Essex, MD;  Location: [facility] ENDOSCOPY;  Service: Endoscopy;  Laterality: N/A;   Family History  Problem Relation Age of Onset  . Heart disease Father   . Hypertension Father   . Hypertension Mother   . Arthritis Mother   . Hypertension Sister   . Arthritis Sister   . Hypertension Brother    Social History  Substance Use Topics  . Smoking status: Former Smoker    Quit date: 12/04/2003  . Smokeless tobacco: Never Used     Comment: pt. no longer smokes  . Alcohol Use: 0.0 oz/week    0 Standard drinks or equivalent per week     Comment: occasional    Review of Systems  Musculoskeletal: Positive for joint swelling ( right ankle) and arthralgias ( right ankle). Negative for gait problem.  Skin: Negative for color change and wound.  Neurological: Negative for weakness and numbness.  All other systems reviewed and are negative.  Allergies  Shellfish allergy; Other; and Penicillins  Home Medications   Prior to Admission medications   Medication Sig Start Date End Date Taking? Authorizing Provider  albuterol (PROVENTIL HFA;VENTOLIN HFA)  108 (90 BASE) MCG/ACT inhaler Inhale 1-2 puffs into the lungs every 6 (six) hours as needed for wheezing or shortness of breath. 05/15/14   Ezequiel Essex, MD  diphenhydrAMINE (BENADRYL) 25 MG tablet Take 2 tablets (50 mg total) by mouth every 8 (eight) hours as needed (allergic reaction). 04/03/14   Jola Schmidt, MD  HYDROcodone-acetaminophen (NORCO/VICODIN) 5-325 MG tablet Take 1 tablet by mouth every 12 (twelve) hours. 02/20/15   Historical Provider, MD  meloxicam (MOBIC) 15 MG tablet Take 15 mg by mouth daily as needed for pain.  10/05/12   Historical Provider, MD  methocarbamol (ROBAXIN) 500 MG tablet Take 1 tablet (500 mg total) by mouth 2 (two)  times daily. 07/20/14   Al Corpus, PA-C  montelukast (SINGULAIR) 10 MG tablet Take 10 mg by mouth at bedtime.    Historical Provider, MD  NORVIR 100 MG TABS tablet TAKE 1 TABLET BY MOUTH EVERY DAY 01/24/15   Thayer Headings, MD  REYATAZ 300 MG capsule TAKE ONE CAPSULE BY MOUTH DAILY WITH BREAKFAST 01/24/15   Thayer Headings, MD  TIVICAY 50 MG tablet TAKE 1 TABLET BY MOUTH EVERY DAY 01/24/15   Thayer Headings, MD  TRUVADA 200-300 MG per tablet TAKE 1 TABLET BY MOUTH DAILY 01/24/15   Thayer Headings, MD  valACYclovir (VALTREX) 500 MG tablet Take 2 tablets (1,000 mg total) by mouth daily. 11/24/14   Thayer Headings, MD   BP 128/82 mmHg  Pulse 82  Temp(Src) 98.7 F (37.1 C)  Resp 16  Ht 6' (1.829 m)  Wt 374 lb (169.645 kg)  BMI 50.71 kg/m2  SpO2 94% Physical Exam  Constitutional: He is oriented to person, place, and time. He appears well-developed and well-nourished. No distress.  HENT:  Head: Normocephalic.  Eyes: Conjunctivae are normal.  Neck: Normal range of motion. Neck supple.  Cardiovascular: Normal rate, regular rhythm, normal heart sounds and intact distal pulses.   Pulmonary/Chest: Effort normal. No respiratory distress.  Musculoskeletal: Normal range of motion.       Right knee: Normal. He exhibits normal range of motion. No tenderness found.       Right ankle: He exhibits normal range of motion. Tenderness. Lateral malleolus tenderness found. Achilles tendon normal.  FROM of right knee; no tenderness to the fibular head. Mild tenderness to lateral malleolus of right ankle; DP intact; Full active ROM of right ankle; tenderness along 5th metatarsal of right foot. No erythema. Mild edema.  Neurological: He is alert and oriented to person, place, and time. Coordination normal.  Skin: Skin is warm.  Psychiatric: He has a normal mood and affect. His behavior is normal.  Nursing note and vitals reviewed.   ED Course  Procedures   SPLINT APPLICATION Date/Time: 123456 PM Authorized by:  Verl Dicker Consent: Verbal consent obtained. Risks and benefits: risks, benefits and alternatives were discussed Consent given by: patient Splint applied by: orthopedic technician Location details: R ankle Splint type: ASO Supplies used: ASO Post-procedure: The splinted body part was neurovascularly unchanged following the procedure. Patient tolerance: Patient tolerated the procedure well with no immediate complications.    DIAGNOSTIC STUDIES: Oxygen Saturation is 94% on RA, normal by my interpretation.    COORDINATION OF CARE: 9:07 PM Discussed Xray results with pt. Will give right ankle brace in ED. Pt acknowledges and agrees to plan.   Imaging Review Dg Ankle Complete Right  05/06/2015  CLINICAL DATA:  Ankle injury, pain to lateral side with edema for 2 days. EXAM:  RIGHT ANKLE - COMPLETE 3+ VIEW COMPARISON:  None. FINDINGS: Multiple chronic appearing avulsion fracture fragments are seen subjacent to the medial malleolus. Lateral malleolus appears intact and well aligned. No acute appearing fracture line or displaced fracture fragment seen. Ankle mortise is grossly symmetric. Incidental note is made of chronic spurring along the plantar margin of the posterior calcaneus. Visualized portions of the hindfoot and midfoot appear intact and well aligned. At least mild soft tissue edema noted about the right ankle. IMPRESSION: 1. Soft tissue swelling. 2. No acute fracture or dislocation. Multiple old avulsion fracture fragments along the inferior margin of the medial malleolus. Electronically Signed   By: Franki Cabot M.D.   On: 05/06/2015 20:47   I have personally reviewed and evaluated these images as part of my medical decision-making.  MDM   Final diagnoses:  Right ankle pain    Symptoms likely due to ankle sprain. Mild tenderness to lateral malleolus as well as fifth metatarsal, will obtain x-ray. Patient X-Ray negative for acute fracture.  Neurovascularly intact with full  range of motion. Pt advised to continue with Motrin and Tylenol for pain. Patient given brace while in ED, conservative therapy recommended and discussed. Patient will be discharged home & is agreeable with above plan. Returns precautions discussed. Pt appears safe for discharge.   I personally performed the services described in this documentation, which was scribed in my presence. The recorded information has been reviewed and is accurate.    Comer Locket, PA-C 05/06/15 2131  Harvel Quale, MD 05/11/15 [phone]

## 2015-05-06 NOTE — Discharge Instructions (Signed)
You were evaluated in the ED today for ankle pain. Your symptoms are likely due to a sprain. He may use Motrin as needed for your discomfort. You may also use your ankle brace when you were active. Please try to rest your ankle, elevate it and use ice. Follow-up with your doctor as needed. Return to ED for any new or worsening symptoms.  Ankle Pain Ankle pain is a common symptom. The bones, cartilage, tendons, and muscles of the ankle joint perform a lot of work each day. The ankle joint holds your body weight and allows you to move around. Ankle pain can occur on either side or back of 1 or both ankles. Ankle pain may be sharp and burning or dull and aching. There may be tenderness, stiffness, redness, or warmth around the ankle. The pain occurs more often when a person walks or puts pressure on the ankle. CAUSES  There are many reasons ankle pain can develop. It is important to work with your caregiver to identify the cause since many conditions can impact the bones, cartilage, muscles, and tendons. Causes for ankle pain include:  Injury, including a break (fracture), sprain, or strain often due to a fall, sports, or a high-impact activity.  Swelling (inflammation) of a tendon (tendonitis).  Achilles tendon rupture.  Ankle instability after repeated sprains and strains.  Poor foot alignment.  Pressure on a nerve (tarsal tunnel syndrome).  Arthritis in the ankle or the lining of the ankle.  Crystal formation in the ankle (gout or pseudogout). DIAGNOSIS  A diagnosis is based on your medical history, your symptoms, results of your physical exam, and results of diagnostic tests. Diagnostic tests may include X-ray exams or a computerized magnetic scan (magnetic resonance imaging, MRI). TREATMENT  Treatment will depend on the cause of your ankle pain and may include:  Keeping pressure off the ankle and limiting activities.  Using crutches or other walking support (a cane or brace).  Using  rest, ice, compression, and elevation.  Participating in physical therapy or home exercises.  Wearing shoe inserts or special shoes.  Losing weight.  Taking medications to reduce pain or swelling or receiving an injection.  Undergoing surgery. HOME CARE INSTRUCTIONS   Only take over-the-counter or prescription medicines for pain, discomfort, or fever as directed by your caregiver.  Put ice on the injured area.  Put ice in a plastic bag.  Place a towel between your skin and the bag.  Leave the ice on for 15-20 minutes at a time, 03-04 times a day.  Keep your leg raised (elevated) when possible to lessen swelling.  Avoid activities that cause ankle pain.  Follow specific exercises as directed by your caregiver.  Record how often you have ankle pain, the location of the pain, and what it feels like. This information may be helpful to you and your caregiver.  Ask your caregiver about returning to work or sports and whether you should drive.  Follow up with your caregiver for further examination, therapy, or testing as directed. SEEK MEDICAL CARE IF:   Pain or swelling continues or worsens beyond 1 week.  You have an oral temperature above 102 F (38.9 C).  You are feeling unwell or have chills.  You are having an increasingly difficult time with walking.  You have loss of sensation or other new symptoms.  You have questions or concerns. MAKE SURE YOU:   Understand these instructions.  Will watch your condition.  Will get help right away if you  are not doing well or get worse.   This information is not intended to replace advice given to you by your health care provider. Make sure you discuss any questions you have with your health care provider.   Document Released: 10/24/2009 Document Revised: 07/29/2011 Document Reviewed: 12/06/2014 Elsevier Interactive Patient Education [employer].

## 2015-05-06 NOTE — ED Notes (Signed)
Pt. reports right ankle/foot pain with swelling onset yesterday , denies injury or fall , ambulatory .

## 2015-05-06 NOTE — ED Notes (Signed)
Pt able to ambulate independently to wheelchair

## 2015-05-26 DIAGNOSIS — E291 Testicular hypofunction: Secondary | ICD-10-CM | POA: Diagnosis not present

## 2015-05-27 ENCOUNTER — Other Ambulatory Visit: Payer: Self-pay | Admitting: Internal Medicine

## 2015-05-30 ENCOUNTER — Other Ambulatory Visit: Payer: Self-pay | Admitting: *Deleted

## 2015-05-30 DIAGNOSIS — B20 Human immunodeficiency virus [HIV] disease: Secondary | ICD-10-CM

## 2015-05-30 MED ORDER — DOLUTEGRAVIR SODIUM 50 MG PO TABS: 50.0000 mg | ORAL_TABLET | Freq: Every day | ORAL | Status: DC

## 2015-05-30 MED ORDER — RITONAVIR 100 MG PO TABS: 100.0000 mg | ORAL_TABLET | Freq: Every day | ORAL | Status: DC

## 2015-05-30 MED ORDER — ATAZANAVIR SULFATE 300 MG PO CAPS: ORAL_CAPSULE | ORAL | Status: DC

## 2015-05-30 MED ORDER — EMTRICITABINE-TENOFOVIR DF 200-300 MG PO TABS: 1.0000 | ORAL_TABLET | Freq: Every day | ORAL | Status: DC

## 2015-06-01 ENCOUNTER — Ambulatory Visit: Payer: Medicare Other | Admitting: Internal Medicine

## 2015-06-12 DIAGNOSIS — E7800 Pure hypercholesterolemia, unspecified: Secondary | ICD-10-CM | POA: Diagnosis not present

## 2015-06-12 DIAGNOSIS — I10 Essential (primary) hypertension: Secondary | ICD-10-CM | POA: Diagnosis not present

## 2015-06-12 DIAGNOSIS — D72829 Elevated white blood cell count, unspecified: Secondary | ICD-10-CM | POA: Diagnosis not present

## 2015-06-12 DIAGNOSIS — Z79899 Other long term (current) drug therapy: Secondary | ICD-10-CM | POA: Diagnosis not present

## 2015-06-12 DIAGNOSIS — E291 Testicular hypofunction: Secondary | ICD-10-CM | POA: Diagnosis not present

## 2015-06-12 DIAGNOSIS — B351 Tinea unguium: Secondary | ICD-10-CM | POA: Diagnosis not present

## 2015-06-12 DIAGNOSIS — L603 Nail dystrophy: Secondary | ICD-10-CM | POA: Diagnosis not present

## 2015-06-12 DIAGNOSIS — G4733 Obstructive sleep apnea (adult) (pediatric): Secondary | ICD-10-CM | POA: Diagnosis not present

## 2015-06-12 DIAGNOSIS — R69 Illness, unspecified: Secondary | ICD-10-CM | POA: Diagnosis not present

## 2015-06-12 DIAGNOSIS — R748 Abnormal levels of other serum enzymes: Secondary | ICD-10-CM | POA: Diagnosis not present

## 2015-07-10 DIAGNOSIS — E291 Testicular hypofunction: Secondary | ICD-10-CM | POA: Diagnosis not present

## 2015-07-26 DIAGNOSIS — E291 Testicular hypofunction: Secondary | ICD-10-CM | POA: Diagnosis not present

## 2015-08-13 ENCOUNTER — Encounter (HOSPITAL_COMMUNITY): Payer: Self-pay

## 2015-08-13 ENCOUNTER — Emergency Department (HOSPITAL_COMMUNITY): Payer: Medicare HMO

## 2015-08-13 DIAGNOSIS — R42 Dizziness and giddiness: Secondary | ICD-10-CM | POA: Insufficient documentation

## 2015-08-13 DIAGNOSIS — Z8719 Personal history of other diseases of the digestive system: Secondary | ICD-10-CM | POA: Diagnosis not present

## 2015-08-13 DIAGNOSIS — Z8739 Personal history of other diseases of the musculoskeletal system and connective tissue: Secondary | ICD-10-CM | POA: Insufficient documentation

## 2015-08-13 DIAGNOSIS — R000 Tachycardia, unspecified: Secondary | ICD-10-CM | POA: Insufficient documentation

## 2015-08-13 DIAGNOSIS — Z79899 Other long term (current) drug therapy: Secondary | ICD-10-CM | POA: Diagnosis not present

## 2015-08-13 DIAGNOSIS — Z8619 Personal history of other infectious and parasitic diseases: Secondary | ICD-10-CM | POA: Diagnosis not present

## 2015-08-13 DIAGNOSIS — Z87448 Personal history of other diseases of urinary system: Secondary | ICD-10-CM | POA: Insufficient documentation

## 2015-08-13 DIAGNOSIS — J45909 Unspecified asthma, uncomplicated: Secondary | ICD-10-CM | POA: Insufficient documentation

## 2015-08-13 DIAGNOSIS — Z21 Asymptomatic human immunodeficiency virus [HIV] infection status: Secondary | ICD-10-CM | POA: Insufficient documentation

## 2015-08-13 DIAGNOSIS — R69 Illness, unspecified: Secondary | ICD-10-CM | POA: Diagnosis not present

## 2015-08-13 DIAGNOSIS — Z872 Personal history of diseases of the skin and subcutaneous tissue: Secondary | ICD-10-CM | POA: Diagnosis not present

## 2015-08-13 DIAGNOSIS — R079 Chest pain, unspecified: Secondary | ICD-10-CM | POA: Diagnosis not present

## 2015-08-13 DIAGNOSIS — I10 Essential (primary) hypertension: Secondary | ICD-10-CM | POA: Insufficient documentation

## 2015-08-13 DIAGNOSIS — B349 Viral infection, unspecified: Secondary | ICD-10-CM | POA: Diagnosis not present

## 2015-08-13 DIAGNOSIS — R05 Cough: Secondary | ICD-10-CM | POA: Diagnosis not present

## 2015-08-13 DIAGNOSIS — E669 Obesity, unspecified: Secondary | ICD-10-CM | POA: Diagnosis not present

## 2015-08-13 DIAGNOSIS — Z880 Allergy status to penicillin: Secondary | ICD-10-CM | POA: Insufficient documentation

## 2015-08-13 DIAGNOSIS — Z8669 Personal history of other diseases of the nervous system and sense organs: Secondary | ICD-10-CM | POA: Insufficient documentation

## 2015-08-13 LAB — CBC
HCT: 42.6 % (ref 39.0–52.0)
Hemoglobin: 14.2 g/dL (ref 13.0–17.0)
MCH: 31.1 pg (ref 26.0–34.0)
MCHC: 33.3 g/dL (ref 30.0–36.0)
MCV: 93.4 fL (ref 78.0–100.0)
Platelets: 263 10*3/uL (ref 150–400)
RBC: 4.56 MIL/uL (ref 4.22–5.81)
RDW: 14.5 % (ref 11.5–15.5)
WBC: 7.3 10*3/uL (ref 4.0–10.5)

## 2015-08-13 LAB — BASIC METABOLIC PANEL
Anion gap: 8 (ref 5–15)
BUN: 12 mg/dL (ref 6–20)
CO2: 28 mmol/L (ref 22–32)
Calcium: 9.1 mg/dL (ref 8.9–10.3)
Chloride: 105 mmol/L (ref 101–111)
Creatinine, Ser: 1.2 mg/dL (ref 0.61–1.24)
GFR calc Af Amer: >60 mL/min (ref >60)
GFR calc non Af Amer: >60 mL/min (ref >60)
Glucose, Bld: 110 mg/dL — ABNORMAL HIGH (ref 65–99)
Potassium: 3.9 mmol/L (ref 3.5–5.1)
Sodium: 141 mmol/L (ref 135–145)

## 2015-08-13 LAB — I-STAT TROPONIN, ED: Troponin i, poc: 0 ng/mL (ref 0.00–0.08)

## 2015-08-13 NOTE — ED Notes (Signed)
Pt reports onset 1 week cough, congestion, shortness of breath.  Symptoms worsening this morning.  Using inhaler with only 15 min relief.  Pt talking in complete sentences.  Onset this morning chest pain underneath left breast.

## 2015-08-14 ENCOUNTER — Emergency Department (HOSPITAL_COMMUNITY)
Admission: EM | Admit: 2015-08-14 | Discharge: 2015-08-14 | Disposition: A | Discharge status: Home / Self Care | Payer: Medicare HMO | Attending: Physician Assistant | Admitting: Physician Assistant

## 2015-08-14 DIAGNOSIS — R000 Tachycardia, unspecified: Secondary | ICD-10-CM | POA: Diagnosis not present

## 2015-08-14 DIAGNOSIS — R079 Chest pain, unspecified: Secondary | ICD-10-CM | POA: Diagnosis not present

## 2015-08-14 DIAGNOSIS — J45909 Unspecified asthma, uncomplicated: Secondary | ICD-10-CM | POA: Diagnosis not present

## 2015-08-14 DIAGNOSIS — E669 Obesity, unspecified: Secondary | ICD-10-CM | POA: Diagnosis not present

## 2015-08-14 DIAGNOSIS — Z8619 Personal history of other infectious and parasitic diseases: Secondary | ICD-10-CM | POA: Diagnosis not present

## 2015-08-14 DIAGNOSIS — R42 Dizziness and giddiness: Secondary | ICD-10-CM | POA: Diagnosis not present

## 2015-08-14 DIAGNOSIS — R69 Illness, unspecified: Secondary | ICD-10-CM | POA: Diagnosis not present

## 2015-08-14 DIAGNOSIS — B349 Viral infection, unspecified: Secondary | ICD-10-CM

## 2015-08-14 DIAGNOSIS — Z79899 Other long term (current) drug therapy: Secondary | ICD-10-CM | POA: Diagnosis not present

## 2015-08-14 DIAGNOSIS — I10 Essential (primary) hypertension: Secondary | ICD-10-CM | POA: Diagnosis not present

## 2015-08-14 MED ORDER — GUAIFENESIN-CODEINE 100-10 MG/5ML PO SOLN
5.0000 mL | Freq: Four times a day (QID) | ORAL | Status: DC | PRN
Start: 2015-08-14 — End: 2016-01-24

## 2015-08-14 MED ORDER — SODIUM CHLORIDE 0.9 % IV BOLUS (SEPSIS)
1000.0000 mL | Freq: Once | INTRAVENOUS | Status: AC
  Administered 2015-08-14: 1000 mL via INTRAVENOUS

## 2015-08-14 MED ORDER — IBUPROFEN 800 MG PO TABS: 800.0000 mg | ORAL_TABLET | Freq: Three times a day (TID) | ORAL | Status: DC

## 2015-08-14 MED ORDER — KETOROLAC TROMETHAMINE 30 MG/ML IJ SOLN
30.0000 mg | Freq: Once | INTRAMUSCULAR | Status: AC
  Administered 2015-08-14: 30 mg via INTRAVENOUS
  Filled 2015-08-14: qty 1

## 2015-08-14 MED ORDER — BENZONATATE 100 MG PO CAPS: 100.0000 mg | ORAL_CAPSULE | Freq: Three times a day (TID) | ORAL | Status: DC | PRN

## 2015-08-14 NOTE — ED Provider Notes (Addendum)
CSN: [id number]     Arrival date & time 08/13/15  2159 History  By signing my name below, I, Irene Pap, attest that this documentation has been prepared under the direction and in the presence of Avantika Shere Julio Alm, MD. Electronically Signed: Irene Pap, ED Scribe. 08/14/2015. 1:27 AM.   Chief Complaint  Patient presents with  . Chest Pain  . Cough  . Shortness of Breath   The history is provided by the patient. No language interpreter was used.  HPI Comments: Vincent Young is a 50 y.o. Male with a hx of HIV, HTN, exposure to TB, acute renal failure, and sleep apnea who presents to the Emergency Department complaining of cough onset one week ago, worsening this morning. Pt reports associated congestion, dizziness, headache, generalized myalgias, chills, SOB, and left sided chest pain under the breast that began this morning. He reports using an inhaler with only 15 minutes of relief before symptoms returned. He has taken ibuprofen and Tylenol to no relief. Pt states that his son was diagnosed with Flu B two weeks ago. He denies nausea and vomiting.   Past Medical History  Diagnosis Date  . HIV infection ([place])   . Recurrent genital HSV (herpes simplex virus) infection   . Pilonidal cyst   . Arthritis   . Sleep apnea     never tested-has s/s  . Hypertension   . Family history of adverse reaction to anesthesia     " My brother has had PONV"  . Asthma   . Exposure to TB   . Acute renal failure (HCC)     due to dehydration  . GERD (gastroesophageal reflux disease)   . Headache   . DJD (degenerative joint disease)   . Obesity    Past Surgical History  Procedure Laterality Date  . Peg tube placement  2009  . Uvulectomy  2009  . Tonsillectomy    . Hernia repair      inguinal as a child  . 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 Appendectomy    . Colonoscopy w/ biopsies and polypectomy    . Colonoscopy with propofol N/A 08/29/2014    Procedure: COLONOSCOPY WITH PROPOFOL;  Surgeon: Clarene Essex, MD;  [facility] END[facility]COPY;  Service: Endoscopy;  Laterality: N/A;   Family History  Problem Relation Age of Onset  . Heart disease Father   . Hypertension Father   . Hypertension Mother   . Arthritis Mother   . Hypertension Sister   . Arthritis Sister   . Hypertension Brother    Social History  Substance Use Topics  . Smoking status: Former Smoker    Quit date: 12/04/2003  . Smokeless tobacco: Never Used     Comment: pt. no longer smokes  . Alcohol Use: 0.0 oz/week    0 Standard drinks or equivalent per week     Comment: occasional    Review of Systems  Respiratory: Positive for cough and shortness of breath.   Cardiovascular: Positive for chest pain.  Gastrointestinal: Negative for nausea and vomiting.  Musculoskeletal: Positive for myalgias.  Neurological: Positive for dizziness and headaches.  All other systems reviewed and are negative.  Allergies  Shellfish allergy; Other; and Penicillins  Home Medications   Prior to Admission  medications   Medication Sig Start Date End Date Taking? Authorizing Provider  atazanavir (REYATAZ) 300 MG capsule TAKE ONE CAPSULE BY MOUTH DAILY WITH BREAKFAST 05/30/15  Yes Thayer Headings, MD  dolutegravir (TIVICAY) 50 MG tablet Take 1 tablet (50 mg total) by mouth daily. 05/30/15  Yes Thayer Headings, MD  emtricitabine-tenofovir (TRUVADA) 200-300 MG tablet Take 1 tablet by mouth daily. 05/30/15  Yes Thayer Headings, MD  ritonavir (NORVIR) 100 MG TABS tablet Take 1 tablet (100 mg total) by mouth daily. 05/30/15  Yes Thayer Headings, MD  valACYclovir (VALTREX) 500 MG tablet TAKE 2 TABLETS BY MOUTH DAILY 05/30/15  Yes Thayer Headings, MD  albuterol (PROVENTIL  HFA;VENTOLIN HFA) 108 (90 BASE) MCG/ACT inhaler Inhale 1-2 puffs into the lungs every 6 (six) hours as needed for wheezing or shortness of breath. Patient not taking: Reported on 08/14/2015 05/15/14   Ezequiel Essex, MD  diphenhydrAMINE (BENADRYL) 25 MG tablet Take 2 tablets (50 mg total) by mouth every 8 (eight) hours as needed (allergic reaction). Patient not taking: Reported on 08/14/2015 04/03/14   Jola Schmidt, MD  methocarbamol (ROBAXIN) 500 MG tablet Take 1 tablet (500 mg total) by mouth 2 (two) times daily. Patient not taking: Reported on 08/14/2015 07/20/14   Al Corpus, PA-C   BP 133/85 mmHg  Pulse 97  Temp(Src) 98.6 F (37 C) (Oral)  Resp 20  Ht 6' (1.829 m)  Wt 385 lb 14.4 oz (175.043 kg)  BMI 52.33 kg/m2  SpO2 93% Physical Exam  Constitutional: He is oriented to person, place, and time. He appears well-developed and well-nourished.  HENT:  Head: Normocephalic and atraumatic.  Eyes: EOM are normal. Pupils are equal, round, and reactive to light.  Neck: Normal range of motion. Neck supple.  Cardiovascular: Normal rate, regular rhythm and normal heart sounds.  Exam reveals no gallop and no friction rub.   No murmur heard. Pulmonary/Chest: Effort normal and breath sounds normal. He has no wheezes.  Abdominal: Soft. There is no tenderness.  Musculoskeletal: Normal range of motion.  Neurological: He is alert and oriented to person, place, and time.  Skin: Skin is warm and dry.  Psychiatric: He has a normal mood and affect. His behavior is normal.  Nursing note and vitals reviewed.   ED Course  Procedures (including critical care time) DIAGNOSTIC STUDIES: Oxygen Saturation is 96% on RA, normal by my interpretation.    COORDINATION OF CARE: 1:26 AM-Discussed treatment plan which includes labs, x-ray and IV fluids with pt at bedside and pt agreed to plan.    Labs Review Labs Reviewed  BASIC METABOLIC PANEL - Abnormal; Notable for the following:    Glucose, Bld 110  (*)    All other components within normal limits  CBC  I-STAT TROPOININ, ED    Imaging Review Dg Chest 2 View  08/13/2015  CLINICAL DATA:  Dry cough for 3 days, head congestion. History of hypertension, HIV, renal failure. EXAM: CHEST  2 VIEW COMPARISON:  Chest radiograph May 15, 2014 FINDINGS: Cardiomediastinal silhouette is normal. Mild bronchitic changes. The lungs are clear without pleural effusions or focal consolidations. Trachea projects midline and there is no pneumothorax. Subcentimeter calcifications in LEFT neck are likely vascular. Soft tissue planes and included osseous structures are non-suspicious. IMPRESSION: Mild bronchitic changes without focal consolidation. Electronically Signed   By: Elon Alas M.D.   On: 08/13/2015 23:19   I have personally reviewed and evaluated these images and lab results as part of my medical  decision-making.   EKG Interpretation   Date/Time:  Sunday August 13 2015 22:04:32 EDT Ventricular Rate:  102 PR Interval:  146 QRS Duration: 84 QT Interval:  322 QTC Calculation: 419 R Axis:   59 Text Interpretation:  Sinus tachycardia Otherwise normal ECG no acute  ischemia No significant change since last tracing Confirmed by Gerald Leitz ([id number]) on 08/14/2015 1:15:36 AM      MDM   Final diagnoses:  None    Patient is a 50 year old male presenting with 2 days of myalgias, febrile, cough. Patient has normal vital signs except mild tachycardia. Patient has HIV controlled on meds. Chest x-ray shows no active infection. I suspect the patient has influenza. He reports that the son had it tested positive for influenza B. We will give him 2 L of fluid, ketorolac and have patient follow up with PCP as needed.   I personally performed the services described in this documentation, which was scribed in my presence. The recorded information has been reviewed and is accurate.    Pt ambulated, with normal vital signs, feels improved, would  like to return home. Return precautions expressed.   Tanee Henery Julio Alm, MD 08/14/15 0149  Zanyia Silbaugh Julio Alm, MD 08/14/15 [phone]

## 2015-08-14 NOTE — Discharge Instructions (Signed)
Viral Infections °A viral infection can be caused by different types of viruses. Most viral infections are not serious and resolve on their own. However, some infections may cause severe symptoms and may lead to further complications. °SYMPTOMS °Viruses can frequently cause: °· Minor sore throat. °· Aches and pains. °· Headaches. °· Runny nose. °· Different types of rashes. °· Watery eyes. °· Tiredness. °· Cough. °· Loss of appetite. °· Gastrointestinal infections, resulting in nausea, vomiting, and diarrhea. °These symptoms do not respond to antibiotics because the infection is not caused by bacteria. However, you might catch a bacterial infection following the viral infection. This is sometimes called a "superinfection." Symptoms of such a bacterial infection may include: °· Worsening sore throat with pus and difficulty swallowing. °· Swollen neck glands. °· Chills and a high or persistent fever. °· Severe headache. °· Tenderness over the sinuses. °· Persistent overall ill feeling (malaise), muscle aches, and tiredness (fatigue). °· Persistent cough. °· Yellow, green, or brown mucus production with coughing. °HOME CARE INSTRUCTIONS  °· Only take over-the-counter or prescription medicines for pain, discomfort, diarrhea, or fever as directed by your caregiver. °· Drink enough water and fluids to keep your urine clear or pale yellow. Sports drinks can provide valuable electrolytes, sugars, and hydration. °· Get plenty of rest and maintain proper nutrition. Soups and broths with crackers or rice are fine. °SEEK IMMEDIATE MEDICAL CARE IF:  °· You have severe headaches, shortness of breath, chest pain, neck pain, or an unusual rash. °· You have uncontrolled vomiting, diarrhea, or you are unable to keep down fluids. °· You or your child has an oral temperature above 102° F (38.9° C), not controlled by medicine. °· Your baby is older than 3 months with a rectal temperature of 102° F (38.9° C) or higher. °· Your baby is 3  months old or younger with a rectal temperature of 100.4° F (38° C) or higher. °MAKE SURE YOU:  °· Understand these instructions. °· Will watch your condition. °· Will get help right away if you are not doing well or get worse. °  °This information is not intended to replace advice given to you by your health care provider. Make sure you discuss any questions you have with your health care provider. °  °Document Released: 02/13/2005 Document Revised: 07/29/2011 Document Reviewed: 10/12/2014 °Elsevier Interactive Patient Education ©2016 Elsevier Inc. ° °

## 2015-10-02 ENCOUNTER — Other Ambulatory Visit: Payer: Medicare HMO

## 2015-10-02 DIAGNOSIS — B20 Human immunodeficiency virus [HIV] disease: Secondary | ICD-10-CM

## 2015-10-03 LAB — T-HELPER CELL (CD4) - (RCID CLINIC ONLY)
CD4 % Helper T Cell: 20 % — ABNORMAL LOW (ref 33–55)
CD4 T Cell Abs: 700 /uL (ref 400–2700)

## 2015-10-03 LAB — HIV-1 RNA QUANT-NO REFLEX-BLD
HIV 1 RNA Quant: <20 copies/mL (ref <20)
HIV-1 RNA Quant, Log: <1.3 Log copies/mL (ref <1.30)

## 2015-10-06 DIAGNOSIS — E291 Testicular hypofunction: Secondary | ICD-10-CM | POA: Diagnosis not present

## 2015-10-07 LAB — HLA B*5701: HLA-B*5701 w/rflx HLA-B High: NEGATIVE

## 2015-10-17 ENCOUNTER — Ambulatory Visit: Payer: Medicare HMO | Admitting: Internal Medicine

## 2015-11-05 ENCOUNTER — Other Ambulatory Visit: Payer: Self-pay | Admitting: Internal Medicine

## 2015-11-23 ENCOUNTER — Other Ambulatory Visit: Payer: Self-pay | Admitting: Internal Medicine

## 2015-11-24 ENCOUNTER — Telehealth: Payer: Self-pay | Admitting: *Deleted

## 2015-11-24 NOTE — Telephone Encounter (Signed)
Called patient to schedule him a follow up with MD, as he has not had a visit with an MD since 11/2014. Patient was recently in a Research study and I advised him that the study has now ended and the MD will need to see him at least yearly to continue to fill his medications. Patient stated that he now has a copay and feels he should not have to pay that since it was the state and a "government official with a badge" that came to his door and said he had to come here because he has a disease. Advised him that is medical protocol to see an MD on a regular basis in order to continue to fill medications. He was adamant that he should not have to pay a copay to come to this clinic. I told him to call back on Monday to speak to the office manager [place] with his concerns. And he is happy to do that because he is hopeful "there is a program to help him with his copays." I don't know of any such program, however he will discuss this with the manager.

## 2015-12-01 ENCOUNTER — Other Ambulatory Visit: Payer: Self-pay | Admitting: Internal Medicine

## 2015-12-15 ENCOUNTER — Other Ambulatory Visit: Payer: Self-pay | Admitting: Internal Medicine

## 2015-12-15 DIAGNOSIS — B20 Human immunodeficiency virus [HIV] disease: Secondary | ICD-10-CM

## 2015-12-21 ENCOUNTER — Other Ambulatory Visit: Payer: Medicare HMO

## 2015-12-27 ENCOUNTER — Other Ambulatory Visit: Payer: Medicare HMO

## 2015-12-28 ENCOUNTER — Other Ambulatory Visit: Payer: Medicare HMO

## 2015-12-28 DIAGNOSIS — B20 Human immunodeficiency virus [HIV] disease: Secondary | ICD-10-CM

## 2015-12-28 DIAGNOSIS — Z113 Encounter for screening for infections with a predominantly sexual mode of transmission: Secondary | ICD-10-CM

## 2015-12-28 DIAGNOSIS — Z79899 Other long term (current) drug therapy: Secondary | ICD-10-CM

## 2015-12-28 LAB — CBC WITH DIFFERENTIAL/PLATELET
Basophils Absolute: 0 cells/uL (ref 0–200)
Basophils Relative: 0 %
Eosinophils Absolute: 154 cells/uL (ref 15–500)
Eosinophils Relative: 2 %
HCT: 41.7 % (ref 38.5–50.0)
Hemoglobin: 13.8 g/dL (ref 13.2–17.1)
Lymphocytes Relative: 47 %
Lymphs Abs: 3619 cells/uL (ref 850–3900)
MCH: 31.1 pg (ref 27.0–33.0)
MCHC: 33.1 g/dL (ref 32.0–36.0)
MCV: 93.9 fL (ref 80.0–100.0)
MPV: 8.6 fL (ref 7.5–12.5)
Monocytes Absolute: 616 cells/uL (ref 200–950)
Monocytes Relative: 8 %
Neutro Abs: 3311 cells/uL (ref 1500–7800)
Neutrophils Relative %: 43 %
Platelets: 271 10*3/uL (ref 140–400)
RBC: 4.44 MIL/uL (ref 4.20–5.80)
RDW: 14.6 % (ref 11.0–15.0)
WBC: 7.7 10*3/uL (ref 3.8–10.8)

## 2015-12-28 LAB — COMPREHENSIVE METABOLIC PANEL
ALT: 17 U/L (ref 9–46)
AST: 16 U/L (ref 10–35)
Albumin: 3.7 g/dL (ref 3.6–5.1)
Alkaline Phosphatase: 114 U/L (ref 40–115)
BUN: 14 mg/dL (ref 7–25)
CO2: 25 mmol/L (ref 20–31)
Calcium: 9.1 mg/dL (ref 8.6–10.3)
Chloride: 104 mmol/L (ref 98–110)
Creat: 1.16 mg/dL (ref 0.70–1.33)
Glucose, Bld: 100 mg/dL — ABNORMAL HIGH (ref 65–99)
Potassium: 4.8 mmol/L (ref 3.5–5.3)
Sodium: 138 mmol/L (ref 135–146)
Total Bilirubin: 1.5 mg/dL — ABNORMAL HIGH (ref 0.2–1.2)
Total Protein: 7.1 g/dL (ref 6.1–8.1)

## 2015-12-28 LAB — LIPID PANEL
Cholesterol: 175 mg/dL (ref 125–200)
HDL: 49 mg/dL (ref >=40)
LDL Cholesterol: 113 mg/dL (ref <130)
Total CHOL/HDL Ratio: 3.6 Ratio (ref <=5.0)
Triglycerides: 67 mg/dL (ref <150)
VLDL: 13 mg/dL (ref <30)

## 2015-12-29 LAB — RPR

## 2015-12-29 LAB — T-HELPER CELL (CD4) - (RCID CLINIC ONLY)
CD4 % Helper T Cell: 19 % — ABNORMAL LOW (ref 33–55)
CD4 T Cell Abs: 700 /uL (ref 400–2700)

## 2015-12-30 ENCOUNTER — Other Ambulatory Visit: Payer: Self-pay | Admitting: Internal Medicine

## 2015-12-30 DIAGNOSIS — B20 Human immunodeficiency virus [HIV] disease: Secondary | ICD-10-CM

## 2016-01-01 LAB — HIV-1 RNA QUANT-NO REFLEX-BLD
HIV 1 RNA Quant: 231 copies/mL — ABNORMAL HIGH (ref <20)
HIV-1 RNA Quant, Log: 2.36 Log copies/mL — ABNORMAL HIGH (ref <1.30)

## 2016-01-04 ENCOUNTER — Ambulatory Visit: Payer: Medicare HMO | Admitting: Internal Medicine

## 2016-01-24 ENCOUNTER — Encounter: Payer: Self-pay | Admitting: Internal Medicine

## 2016-01-24 ENCOUNTER — Ambulatory Visit (INDEPENDENT_AMBULATORY_CARE_PROVIDER_SITE_OTHER): Payer: Medicare HMO | Admitting: Internal Medicine

## 2016-01-24 VITALS — BP 144/89 | Temp 98.4°F | Ht 73.0 in | Wt 393.0 lb

## 2016-01-24 DIAGNOSIS — B009 Herpesviral infection, unspecified: Secondary | ICD-10-CM | POA: Diagnosis not present

## 2016-01-24 DIAGNOSIS — Z23 Encounter for immunization: Secondary | ICD-10-CM | POA: Diagnosis not present

## 2016-01-24 DIAGNOSIS — B20 Human immunodeficiency virus [HIV] disease: Secondary | ICD-10-CM

## 2016-01-24 DIAGNOSIS — R69 Illness, unspecified: Secondary | ICD-10-CM | POA: Diagnosis not present

## 2016-01-24 MED ORDER — EMTRICITABINE-TENOFOVIR AF 200-25 MG PO TABS: 1.0000 | ORAL_TABLET | Freq: Every day | ORAL | 5 refills | Status: DC

## 2016-01-24 MED ORDER — RITONAVIR 100 MG PO TABS: 100.0000 mg | ORAL_TABLET | Freq: Every day | ORAL | 5 refills | Status: DC

## 2016-01-24 MED ORDER — DOLUTEGRAVIR SODIUM 50 MG PO TABS: 50.0000 mg | ORAL_TABLET | Freq: Every day | ORAL | 5 refills | Status: DC

## 2016-01-24 MED ORDER — ATAZANAVIR SULFATE 300 MG PO CAPS: 300.0000 mg | ORAL_CAPSULE | Freq: Every day | ORAL | 5 refills | Status: DC

## 2016-01-24 MED ORDER — VALACYCLOVIR HCL 500 MG PO TABS: 1000.0000 mg | ORAL_TABLET | Freq: Every day | ORAL | 5 refills | Status: DC

## 2016-01-24 MED FILL — TIVICAY 50 MG TABLET: 50 | 30 days supply | Qty: 30 | Fill #0

## 2016-01-24 MED FILL — DESCOVY 200-25 MG TABS: 200-25 | 30 days supply | Qty: 30 | Fill #0

## 2016-01-24 MED FILL — VALACYCLOVIR HCL 500 MG TAB: 500 | 30 days supply | Qty: 60 | Fill #0

## 2016-01-25 LAB — HIV-1 RNA ULTRAQUANT REFLEX TO GENTYP+
HIV 1 RNA Quant: 159 copies/mL — ABNORMAL HIGH (ref <20)
HIV-1 RNA Quant, Log: 2.2 Log copies/mL — ABNORMAL HIGH (ref <1.30)

## 2016-01-25 NOTE — Progress Notes (Signed)
  Subjective:    Patient ID: Vincent Young, male    DOB: 17-Jun-1965, 50 y.o.   MRN: [id number]  HPI He comes in for routine followup. He continues on Tivicay, Reyataz, Norvir and Truvada.  Has not been here in 1 year.  Did miss some of his medications including a period off for about 2 weeks.  Recent viral load was 231 and CD4 of 700.  Knows he needs to stay on it.  Trying to lose weight.  Still takes Valtrex for HSV suppression. No diarrhea, no associated rash.     Review of Systems  Constitutional: Negative for fatigue and unexpected weight change.  HENT: Negative for trouble swallowing.   Eyes: Negative for visual disturbance.  Gastrointestinal: Negative for abdominal pain, diarrhea and nausea.  Skin: Negative for rash.  Neurological: Negative for dizziness, light-headedness and headaches.       Objective:   Physical Exam  Constitutional: He appears well-developed and well-nourished. No distress.  HENT:  Mouth/Throat: No oropharyngeal exudate.  Eyes: No scleral icterus.  Cardiovascular: Normal rate, regular rhythm and normal heart sounds.   No murmur heard. Pulmonary/Chest: Effort normal and breath sounds normal. No respiratory distress.  Lymphadenopathy:    He has no cervical adenopathy.  Skin: No rash noted.   Social History   Social History  . Marital status: Married    Spouse name: N/A  . Number of children: N/A  . Years of education: N/A   Occupational History  . Not on file.   Social History Main Topics  . Smoking status: Former Smoker    Quit date: 12/04/2003  . Smokeless tobacco: Never Used     Comment: pt. no longer smokes  . Alcohol use 0.0 oz/week     Comment: occasional  . Drug use: No  . Sexual activity: Yes    Partners: Female     Comment: pt. declined condoms   Other Topics Concern  . Not on file   Social History Narrative  . No narrative on file         Assessment & Plan:

## 2016-01-25 NOTE — Assessment & Plan Note (Signed)
Continue with Valtrex suppression

## 2016-01-25 NOTE — Assessment & Plan Note (Signed)
Had some trouble with his pharmacy and he is looking for a new pharmacy.  Will send to [facility] per patient request.  Will also repeat his viral load today to be sure it is not already developing resistance.

## 2016-01-25 NOTE — Assessment & Plan Note (Signed)
Encouraged weight loss 

## 2016-02-20 MED FILL — DESCOVY 200-25 MG TABS: 200-25 | 30 days supply | Qty: 30 | Fill #1

## 2016-02-20 MED FILL — NORVIR 100 MG TABLET: 100 | 30 days supply | Qty: 30 | Fill #0

## 2016-02-20 MED FILL — VALACYCLOVIR HCL 500 MG TAB: 500 | 30 days supply | Qty: 60 | Fill #1

## 2016-02-20 MED FILL — REYATAZ 300 MG CAPSULE: 300 | 30 days supply | Qty: 30 | Fill #0

## 2016-02-20 MED FILL — TIVICAY 50 MG TABLET: 50 | 30 days supply | Qty: 30 | Fill #1

## 2016-03-15 MED FILL — DESCOVY 200-25 MG TABS: 200-25 | 30 days supply | Qty: 30 | Fill #2

## 2016-03-15 MED FILL — TIVICAY 50 MG TABLET: 50 | 30 days supply | Qty: 30 | Fill #2

## 2016-03-15 MED FILL — NORVIR 100 MG TABLET: 100 | 30 days supply | Qty: 30 | Fill #1

## 2016-03-15 MED FILL — VALACYCLOVIR HCL 500 MG TAB: 500 | 30 days supply | Qty: 60 | Fill #2

## 2016-03-15 MED FILL — REYATAZ 300 MG CAPSULE: 300 | 30 days supply | Qty: 30 | Fill #1

## 2016-04-10 ENCOUNTER — Other Ambulatory Visit: Payer: Medicare HMO

## 2016-04-10 MED FILL — REYATAZ 300 MG CAPSULE: 300 | 30 days supply | Qty: 30 | Fill #2

## 2016-04-10 MED FILL — DESCOVY 200-25 MG TABS: 200-25 | 30 days supply | Qty: 30 | Fill #3

## 2016-04-10 MED FILL — TIVICAY 50 MG TABLET: 50 | 30 days supply | Qty: 30 | Fill #3

## 2016-04-10 MED FILL — NORVIR 100 MG TABLET: 100 | 30 days supply | Qty: 30 | Fill #2

## 2016-04-10 MED FILL — VALACYCLOVIR HCL 500 MG TAB: 500 | 30 days supply | Qty: 60 | Fill #3

## 2016-04-17 ENCOUNTER — Ambulatory Visit: Payer: Medicare HMO

## 2016-04-18 ENCOUNTER — Other Ambulatory Visit (HOSPITAL_COMMUNITY)
Admission: RE | Admit: 2016-04-18 | Discharge: 2016-04-18 | Disposition: A | Discharge status: Home / Self Care | Payer: Medicare HMO | Source: Ambulatory Visit | Attending: Internal Medicine | Admitting: Internal Medicine

## 2016-04-18 ENCOUNTER — Other Ambulatory Visit: Payer: Medicare HMO

## 2016-04-18 DIAGNOSIS — B20 Human immunodeficiency virus [HIV] disease: Secondary | ICD-10-CM

## 2016-04-18 DIAGNOSIS — Z113 Encounter for screening for infections with a predominantly sexual mode of transmission: Secondary | ICD-10-CM

## 2016-04-19 LAB — URINE CYTOLOGY ANCILLARY ONLY
Chlamydia: NEGATIVE
Neisseria Gonorrhea: NEGATIVE

## 2016-04-19 LAB — T-HELPER CELL (CD4) - (RCID CLINIC ONLY)
CD4 % Helper T Cell: 23 % — ABNORMAL LOW (ref 33–55)
CD4 T Cell Abs: 800 /uL (ref 400–2700)

## 2016-04-22 LAB — HIV-1 RNA QUANT-NO REFLEX-BLD
HIV 1 RNA Quant: 112 copies/mL — ABNORMAL HIGH (ref <20)
HIV-1 RNA Quant, Log: 2.05 Log copies/mL — ABNORMAL HIGH (ref <1.30)

## 2016-04-25 ENCOUNTER — Ambulatory Visit: Payer: Medicare HMO | Admitting: Internal Medicine

## 2016-05-07 ENCOUNTER — Encounter: Payer: Self-pay | Admitting: Internal Medicine

## 2016-05-08 MED FILL — NORVIR 100 MG TABLET: 100 | 30 days supply | Qty: 30 | Fill #3

## 2016-05-08 MED FILL — REYATAZ 300 MG CAPSULE: 300 | 30 days supply | Qty: 30 | Fill #3

## 2016-05-08 MED FILL — VALACYCLOVIR HCL 500 MG TAB: 500 | 30 days supply | Qty: 60 | Fill #4

## 2016-05-08 MED FILL — DESCOVY 200-25 MG TABS: 200-25 | 30 days supply | Qty: 30 | Fill #4

## 2016-05-08 MED FILL — TIVICAY 50 MG TABLET: 50 | 30 days supply | Qty: 30 | Fill #4

## 2016-05-09 DIAGNOSIS — I10 Essential (primary) hypertension: Secondary | ICD-10-CM | POA: Diagnosis not present

## 2016-05-09 DIAGNOSIS — R635 Abnormal weight gain: Secondary | ICD-10-CM | POA: Diagnosis not present

## 2016-05-09 DIAGNOSIS — Z6841 Body Mass Index (BMI) 40.0 and over, adult: Secondary | ICD-10-CM | POA: Diagnosis not present

## 2016-05-09 DIAGNOSIS — E7800 Pure hypercholesterolemia, unspecified: Secondary | ICD-10-CM | POA: Diagnosis not present

## 2016-05-09 DIAGNOSIS — R69 Illness, unspecified: Secondary | ICD-10-CM | POA: Diagnosis not present

## 2016-05-09 DIAGNOSIS — E291 Testicular hypofunction: Secondary | ICD-10-CM | POA: Diagnosis not present

## 2016-05-09 DIAGNOSIS — R358 Other polyuria: Secondary | ICD-10-CM | POA: Diagnosis not present

## 2016-05-09 DIAGNOSIS — Z79899 Other long term (current) drug therapy: Secondary | ICD-10-CM | POA: Diagnosis not present

## 2016-05-09 DIAGNOSIS — N529 Male erectile dysfunction, unspecified: Secondary | ICD-10-CM | POA: Diagnosis not present

## 2016-05-09 DIAGNOSIS — B009 Herpesviral infection, unspecified: Secondary | ICD-10-CM | POA: Diagnosis not present

## 2016-05-22 DIAGNOSIS — E291 Testicular hypofunction: Secondary | ICD-10-CM | POA: Diagnosis not present

## 2016-06-04 MED FILL — VALACYCLOVIR HCL 500 MG TAB: 500 | 30 days supply | Qty: 60 | Fill #5 | Status: TO

## 2016-06-04 MED FILL — REYATAZ 300 MG CAPSULE: 300 | 30 days supply | Qty: 30 | Fill #4 | Status: TO

## 2016-06-04 MED FILL — DESCOVY 200-25 MG TABS: 200-25 | 30 days supply | Qty: 30 | Fill #5 | Status: TO

## 2016-06-04 MED FILL — NORVIR 100 MG TABLET: 100 | 30 days supply | Qty: 30 | Fill #4 | Status: TO

## 2016-06-04 MED FILL — TIVICAY 50 MG TABLET: 50 | 30 days supply | Qty: 30 | Fill #5

## 2016-06-14 DIAGNOSIS — E291 Testicular hypofunction: Secondary | ICD-10-CM | POA: Diagnosis not present

## 2016-06-17 DIAGNOSIS — R861 Abnormal level of hormones in specimens from male genital organs: Secondary | ICD-10-CM | POA: Diagnosis not present

## 2016-06-17 DIAGNOSIS — Z6841 Body Mass Index (BMI) 40.0 and over, adult: Secondary | ICD-10-CM | POA: Diagnosis not present

## 2016-06-17 DIAGNOSIS — R600 Localized edema: Secondary | ICD-10-CM | POA: Diagnosis not present

## 2016-06-17 DIAGNOSIS — M25569 Pain in unspecified knee: Secondary | ICD-10-CM | POA: Diagnosis not present

## 2016-06-17 DIAGNOSIS — G4730 Sleep apnea, unspecified: Secondary | ICD-10-CM | POA: Diagnosis not present

## 2016-06-17 DIAGNOSIS — Z0000 Encounter for general adult medical examination without abnormal findings: Secondary | ICD-10-CM | POA: Diagnosis not present

## 2016-06-17 DIAGNOSIS — G4700 Insomnia, unspecified: Secondary | ICD-10-CM | POA: Diagnosis not present

## 2016-06-17 DIAGNOSIS — B009 Herpesviral infection, unspecified: Secondary | ICD-10-CM | POA: Diagnosis not present

## 2016-06-17 DIAGNOSIS — R69 Illness, unspecified: Secondary | ICD-10-CM | POA: Diagnosis not present

## 2016-06-21 ENCOUNTER — Other Ambulatory Visit: Payer: Self-pay | Admitting: Internal Medicine

## 2016-06-21 DIAGNOSIS — B20 Human immunodeficiency virus [HIV] disease: Secondary | ICD-10-CM

## 2016-06-25 DIAGNOSIS — E291 Testicular hypofunction: Secondary | ICD-10-CM | POA: Diagnosis not present

## 2016-06-28 DIAGNOSIS — E291 Testicular hypofunction: Secondary | ICD-10-CM | POA: Diagnosis not present

## 2016-06-28 DIAGNOSIS — E7800 Pure hypercholesterolemia, unspecified: Secondary | ICD-10-CM | POA: Diagnosis not present

## 2016-06-28 DIAGNOSIS — B009 Herpesviral infection, unspecified: Secondary | ICD-10-CM | POA: Diagnosis not present

## 2016-06-28 DIAGNOSIS — N529 Male erectile dysfunction, unspecified: Secondary | ICD-10-CM | POA: Diagnosis not present

## 2016-06-28 DIAGNOSIS — I10 Essential (primary) hypertension: Secondary | ICD-10-CM | POA: Diagnosis not present

## 2016-06-28 DIAGNOSIS — E119 Type 2 diabetes mellitus without complications: Secondary | ICD-10-CM | POA: Diagnosis not present

## 2016-06-28 DIAGNOSIS — Z6841 Body Mass Index (BMI) 40.0 and over, adult: Secondary | ICD-10-CM | POA: Diagnosis not present

## 2016-06-28 DIAGNOSIS — G4733 Obstructive sleep apnea (adult) (pediatric): Secondary | ICD-10-CM | POA: Diagnosis not present

## 2016-06-28 DIAGNOSIS — Z79899 Other long term (current) drug therapy: Secondary | ICD-10-CM | POA: Diagnosis not present

## 2016-06-28 DIAGNOSIS — R69 Illness, unspecified: Secondary | ICD-10-CM | POA: Diagnosis not present

## 2016-07-12 DIAGNOSIS — E291 Testicular hypofunction: Secondary | ICD-10-CM | POA: Diagnosis not present

## 2016-07-15 MED FILL — TIVICAY 50 MG TABLET: 50 | 30 days supply | Qty: 30 | Fill #0

## 2016-07-15 MED FILL — DESCOVY 200-25 MG TABS: 200-25 | 30 days supply | Qty: 30 | Fill #0

## 2016-07-15 MED FILL — ATAZANAVIR SULFATE 300 MG C: 300 | 30 days supply | Qty: 30 | Fill #0

## 2016-07-15 MED FILL — NORVIR 100 MG TABLET: 100 | 30 days supply | Qty: 30 | Fill #0

## 2016-07-15 MED FILL — VALACYCLOVIR HCL 500 MG TAB: 500 | 30 days supply | Qty: 60 | Fill #0

## 2016-07-25 DIAGNOSIS — E291 Testicular hypofunction: Secondary | ICD-10-CM | POA: Diagnosis not present

## 2016-08-06 ENCOUNTER — Telehealth: Payer: Self-pay | Admitting: Pharmacist Clinician (PhC)/ Clinical Pharmacy Specialist

## 2016-08-06 NOTE — Telephone Encounter (Signed)
Issue with getting Reyataz? Maybe because it's generic now. LV to call back. If we need to change ART, will need to bring back.

## 2016-08-13 ENCOUNTER — Other Ambulatory Visit: Payer: Self-pay | Admitting: Pharmacist

## 2016-08-13 DIAGNOSIS — B20 Human immunodeficiency virus [HIV] disease: Secondary | ICD-10-CM

## 2016-08-19 ENCOUNTER — Other Ambulatory Visit: Payer: Self-pay | Admitting: Pharmacist

## 2016-08-19 DIAGNOSIS — B20 Human immunodeficiency virus [HIV] disease: Secondary | ICD-10-CM

## 2016-08-19 MED ORDER — RITONAVIR 100 MG PO TABS: 100.0000 mg | ORAL_TABLET | Freq: Every day | ORAL | 1 refills | Status: DC

## 2016-08-19 MED ORDER — ATAZANAVIR SULFATE 300 MG PO CAPS: 300.0000 mg | ORAL_CAPSULE | Freq: Every day | ORAL | 1 refills | Status: DC

## 2016-08-19 MED ORDER — DOLUTEGRAVIR SODIUM 50 MG PO TABS: 50.0000 mg | ORAL_TABLET | Freq: Every day | ORAL | 1 refills | Status: DC

## 2016-08-19 MED ORDER — EMTRICITABINE-TENOFOVIR AF 200-25 MG PO TABS: 1.0000 | ORAL_TABLET | Freq: Every day | ORAL | 1 refills | Status: DC

## 2016-08-19 MED FILL — NORVIR 100 MG TABLET: 100 | 30 days supply | Qty: 30 | Fill #0

## 2016-08-19 MED FILL — TIVICAY 50 MG TABLET: 50 | 30 days supply | Qty: 30 | Fill #0

## 2016-08-19 MED FILL — VALACYCLOVIR HCL 500 MG TAB: 500 | 30 days supply | Qty: 60 | Fill #1

## 2016-08-19 MED FILL — ATAZANAVIR SULFATE 300 MG C: 300 | 30 days supply | Qty: 30 | Fill #0

## 2016-08-27 ENCOUNTER — Other Ambulatory Visit: Payer: Medicare HMO

## 2016-09-17 ENCOUNTER — Ambulatory Visit: Payer: Medicare HMO

## 2016-10-09 IMAGING — CR DG CHEST 2V
2 series · 2 of 2 positions shown · non-contrast
Comparison: 11/23/2013.

CLINICAL DATA: Allergic reaction.  Angioedema.  Initial encounter.

EXAM:
CHEST  2 VIEW

[w chest pa]
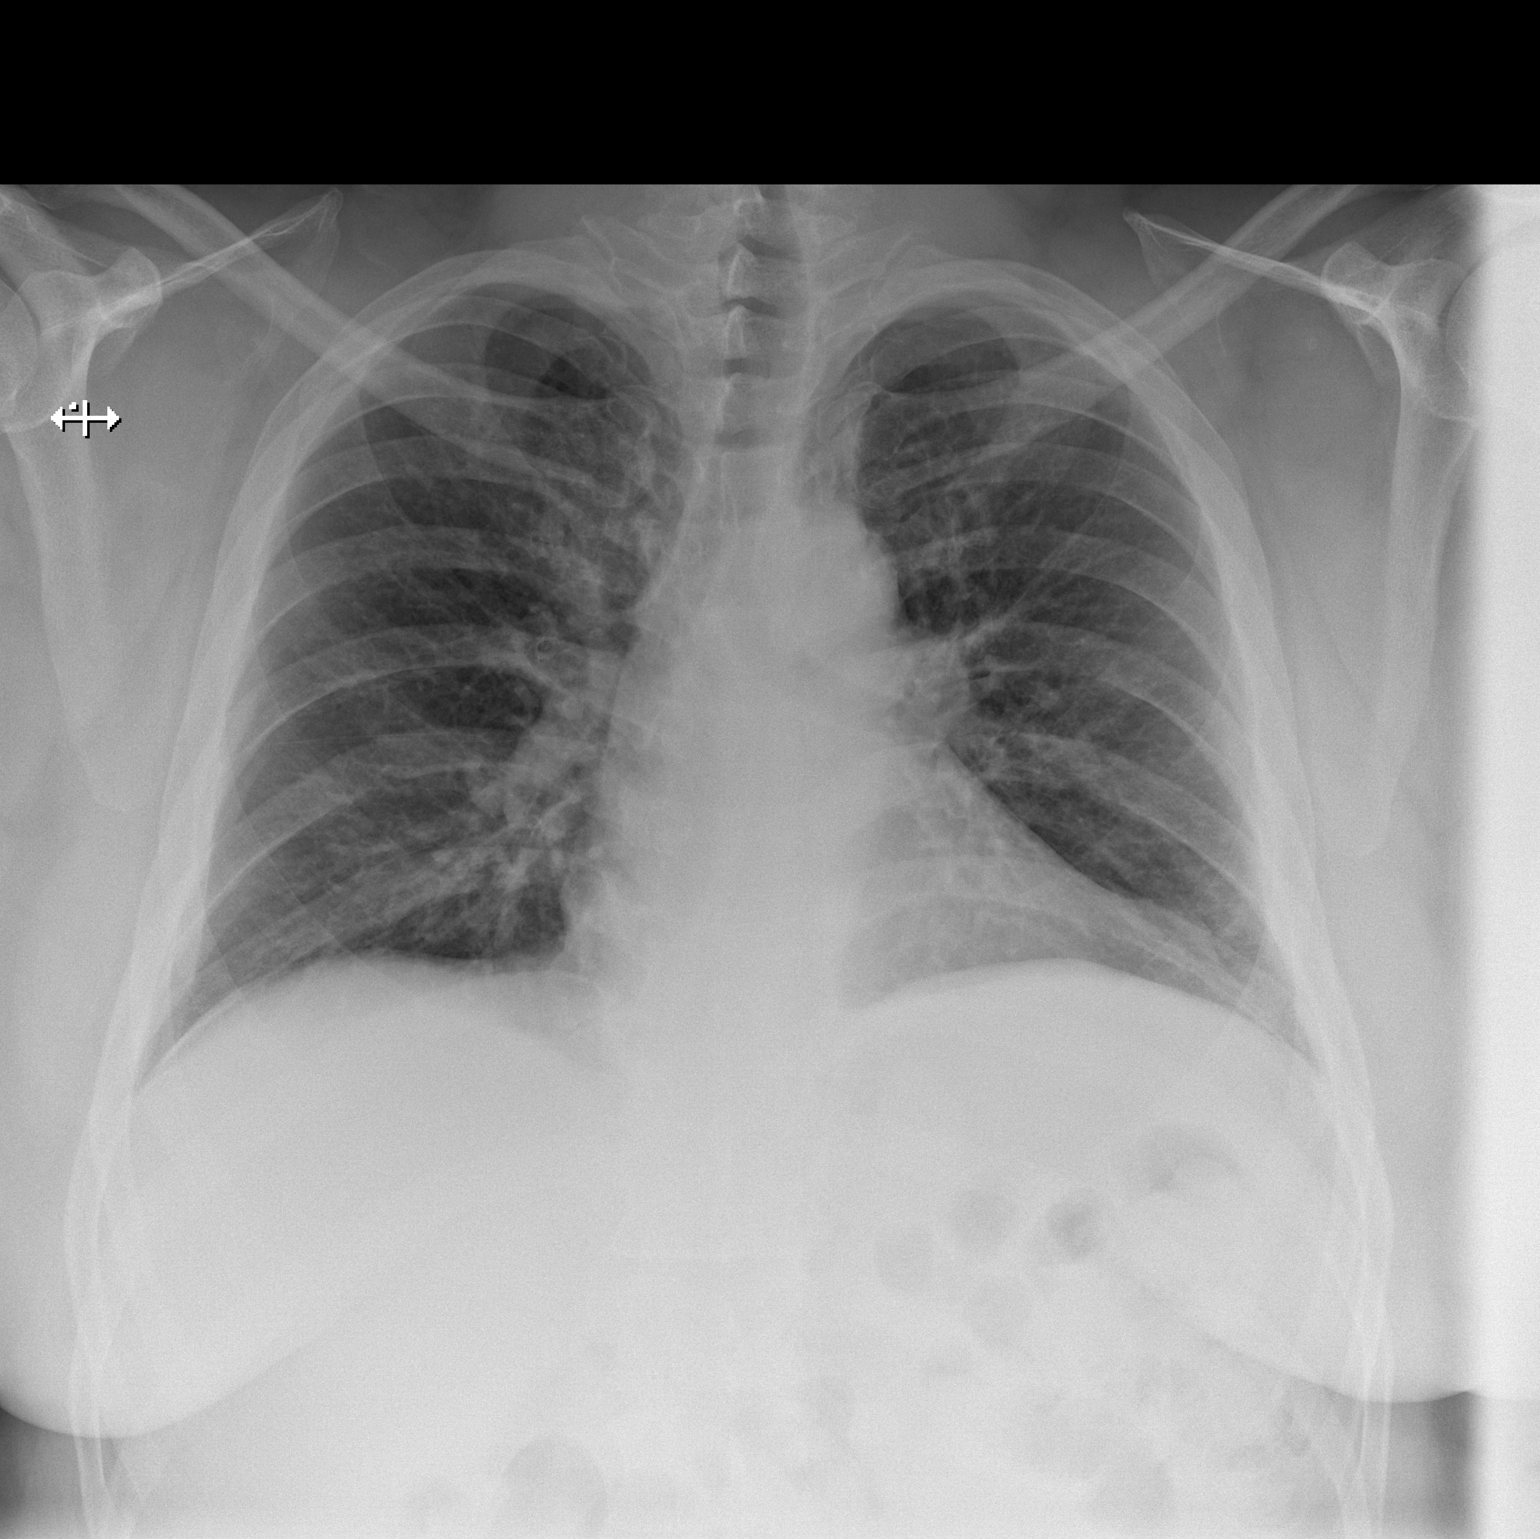

[w chest lat]
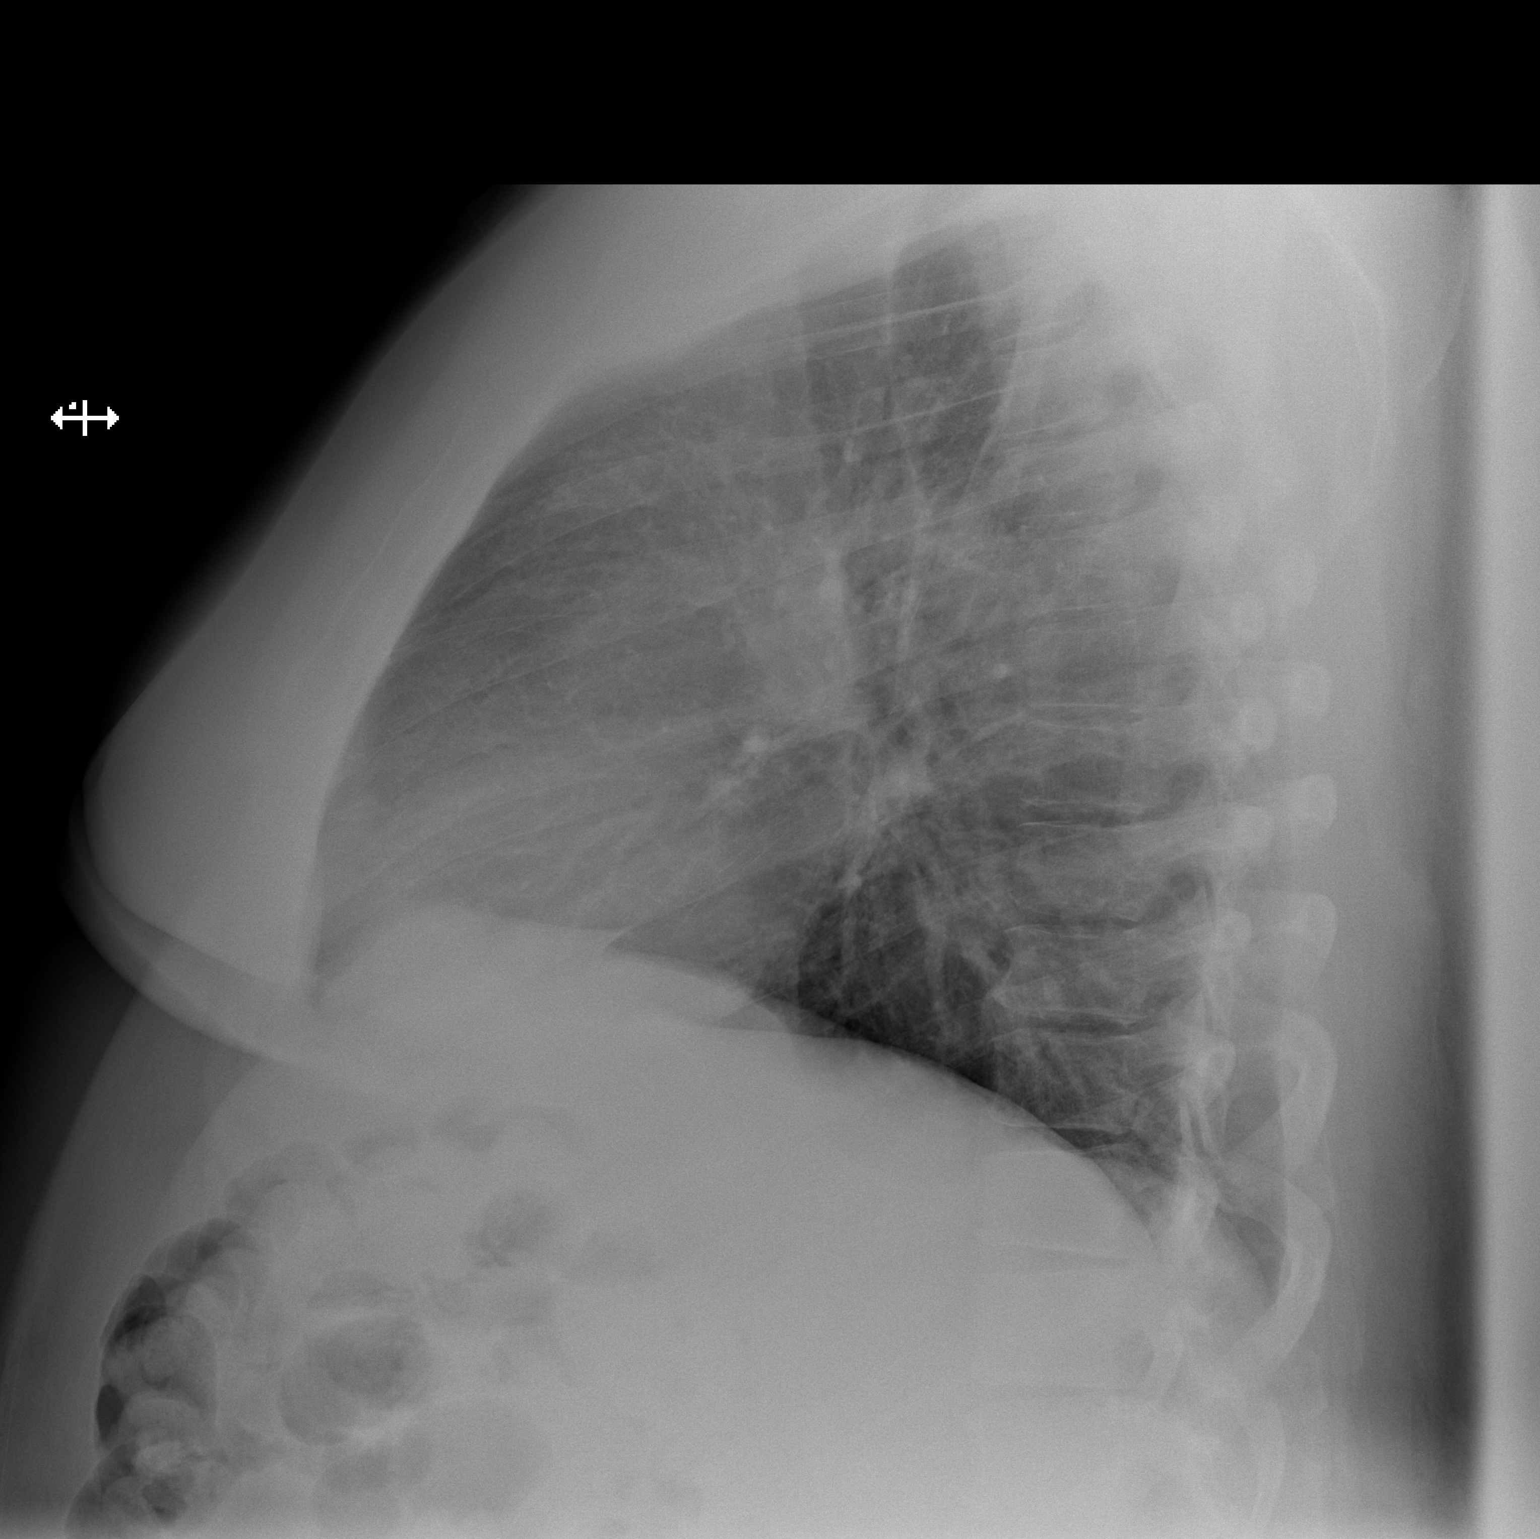

[2 of 2 positions shown; findings below may reference images not displayed]

FINDINGS: Low volume chest. Basilar atelectasis. Cardiopericardial silhouette
within normal limits. Mediastinal contours normal. Trachea midline.
No airspace disease or effusion.
IMPRESSION: Low volume chest with basilar atelectasis.

## 2016-10-23 ENCOUNTER — Telehealth: Payer: Self-pay | Admitting: Pharmacist Clinician (PhC)/ Clinical Pharmacy Specialist

## 2016-10-23 NOTE — Telephone Encounter (Signed)
Finally able to get in touch with him for an appt. He'll come in tomorrow so we can do labs and schedule follow up.

## 2016-10-24 ENCOUNTER — Other Ambulatory Visit: Payer: Self-pay | Admitting: Pharmacist

## 2016-10-24 ENCOUNTER — Ambulatory Visit (INDEPENDENT_AMBULATORY_CARE_PROVIDER_SITE_OTHER): Payer: Medicare HMO | Admitting: Pharmacist

## 2016-10-24 DIAGNOSIS — R69 Illness, unspecified: Secondary | ICD-10-CM | POA: Diagnosis not present

## 2016-10-24 DIAGNOSIS — Z23 Encounter for immunization: Secondary | ICD-10-CM

## 2016-10-24 DIAGNOSIS — B20 Human immunodeficiency virus [HIV] disease: Secondary | ICD-10-CM

## 2016-10-24 MED ORDER — EMTRICITABINE-TENOFOVIR AF 200-25 MG PO TABS: 1.0000 | ORAL_TABLET | Freq: Every day | ORAL | 5 refills | Status: DC

## 2016-10-24 MED ORDER — DOLUTEGRAVIR SODIUM 50 MG PO TABS: 50.0000 mg | ORAL_TABLET | Freq: Every day | ORAL | 5 refills | Status: DC

## 2016-10-24 MED ORDER — ATAZANAVIR SULFATE 300 MG PO CAPS: 300.0000 mg | ORAL_CAPSULE | Freq: Every day | ORAL | 5 refills | Status: DC

## 2016-10-24 MED ORDER — RITONAVIR 100 MG PO TABS: 100.0000 mg | ORAL_TABLET | Freq: Every day | ORAL | 5 refills | Status: DC

## 2016-10-24 NOTE — Progress Notes (Signed)
HPI: Vincent Young is a 51 y.o. male who presents to the [facility] clinic for HIV follow-up and adherence assessment.  He has not been seen here since September 2017.   Allergies: Allergies  Allergen Reactions  . Shellfish Allergy Anaphylaxis  . Other Other (See Comments)    Tree nuts cause throat swelling  . Penicillins Rash    Past Medical History: Past Medical History:  Diagnosis Date  . Acute renal failure (HCC)    due to dehydration  . Arthritis   . Asthma   . DJD (degenerative joint disease)   . Exposure to TB   . Family history of adverse reaction to anesthesia    " My brother has had PONV"  . GERD (gastroesophageal reflux disease)   . Headache   . HIV infection ([place])   . Hypertension   . Obesity   . Pilonidal cyst   . Recurrent genital HSV (herpes simplex virus) infection   . Sleep apnea    never tested-has s/s    Social History: Social History   Social History  . Marital status: Married    Spouse name: N/A  . Number of children: N/A  . Years of education: N/A   Social History Main Topics  . Smoking status: Former Smoker    Quit date: 12/04/2003  . Smokeless tobacco: Never Used     Comment: pt. no longer smokes  . Alcohol use 0.0 oz/week     Comment: occasional  . Drug use: No  . Sexual activity: Yes    Partners: Female     Comment: pt. declined condoms   Other Topics Concern  . Not on file   Social History Narrative  . No narrative on file    Current Regimen: Reyataz + Norvir + Tivicay + Descovy  Labs: HIV 1 RNA Quant (copies/mL)  Date Value  04/18/2016 112 (H)  01/24/2016 159 (H)  12/28/2015 231 (H)   HIV-1 RNA Viral Load (no units)  Date Value  06/08/2013 <40  05/11/2013 <40  02/16/2013 <40   CD4 (no units)  Date Value  06/08/2013 480  05/11/2013 691  02/16/2013 576   CD4 T Cell Abs (/uL)  Date Value  04/18/2016 800  12/28/2015 700  10/02/2015 700   Hep B S Ab (no units)  Date Value  11/24/2014 POS (A)    Hepatitis B Surface Ag (no units)  Date Value  10/18/2009 NEG   HCV Ab (no units)  Date Value  10/18/2009 NEG    CrCl: CrCl cannot be calculated (Patient's most recent lab result is older than the maximum 21 days allowed.).  Lipids:    Component Value Date/Time   CHOL 175 12/28/2015 1135   TRIG 67 12/28/2015 1135   HDL 49 12/28/2015 1135   CHOLHDL 3.6 12/28/2015 1135   VLDL 13 12/28/2015 1135   LDLCALC 113 12/28/2015 1135    Assessment: Vincent Young is here today to see [place] for his HIV infection.  He has not been seen since September 2017. He states he has had car trouble and issues with getting from place to place. He no longer has a job. His insurance ran out last month, but it seems he has [name] now. I will have [name] run it through and see if we can continue filling it.   [name] tells me that he misses doses of his HIV medications all the time.  He says he is testing a theory to see if not taking it all the  time will have the same effect as taking it every day.  He states the medications are full of toxins and hurting his body.  He went on and on about this theory. He also says he knows there is a cure for HIV but with the pharmaceutical companies making so much money, they won't tell anyone about the cure.  He also thinks his HIV medications are causing his erectile dysfunction.  He is actually convinced they are.  He tells me he has been taking the HIV meds on Monday, Wednesday, Friday, and Sundays. Sometimes he has gone a whole month without taking any.  I spent a lot of time (as well as  did also) telling him how harmful this will be to his health. He states this has been working for him for a year and he hasn't gotten sick.  We explained that it isn't the sickness that is the issue, it is resistance.  He already has some baseline resistance from back in the 1990s. He also tells [place] of when he was at the [place] back in 2009 for the IRIS study.  Spent about 30 minutes with him  going back and forth about the medication.  We will get labs today including an all inclusive genotype.  I would like to get a genosure archive as well in case his viral load isn't >2,000 but [name] from lab says she isn't sure [name] will cover it, and if they do not, he will get a bill for $1300. If his viral load isn't high enough for a regular genotype, my advice is to get a genosure archive when he comes back to see Dr. Linus Salmons in 1 month.  I started his Hep A vaccine series today.   Plans: - Continue medications for now - HIV VL with reflex, CD4, Hep A #1 today - F/u with Dr. Linus Salmons and me 7/5 at 10:45am - 2nd Hep A vaccine 12/10 at [place]. Houston Surges, PharmD, [place] [facility] 10/24/2016, 1:45 PM

## 2016-10-25 LAB — T-HELPER CELL (CD4) - (RCID CLINIC ONLY)
CD4 % Helper T Cell: 24 % — ABNORMAL LOW (ref 33–55)
CD4 T Cell Abs: 740 /uL (ref 400–2700)

## 2016-10-28 LAB — HIV RNA, RTPCR W/R GT (RTI, PI,INT)
HIV-1 RNA, QN PCR: 1.74 Log copies/mL — ABNORMAL HIGH
HIV-1 RNA, QN PCR: 55 copies/mL — ABNORMAL HIGH

## 2016-10-30 ENCOUNTER — Other Ambulatory Visit: Payer: Self-pay | Admitting: Pharmacist

## 2016-10-30 DIAGNOSIS — B20 Human immunodeficiency virus [HIV] disease: Secondary | ICD-10-CM

## 2016-10-30 MED ORDER — DOLUTEGRAVIR SODIUM 50 MG PO TABS: 50.0000 mg | ORAL_TABLET | Freq: Every day | ORAL | 5 refills | Status: DC

## 2016-10-30 MED ORDER — RITONAVIR 100 MG PO TABS: 100.0000 mg | ORAL_TABLET | Freq: Every day | ORAL | 5 refills | Status: DC

## 2016-10-30 MED ORDER — ATAZANAVIR SULFATE 300 MG PO CAPS: 300.0000 mg | ORAL_CAPSULE | Freq: Every day | ORAL | 5 refills | Status: DC

## 2016-10-30 MED ORDER — EMTRICITABINE-TENOFOVIR AF 200-25 MG PO TABS: 1.0000 | ORAL_TABLET | Freq: Every day | ORAL | 5 refills | Status: DC

## 2016-10-30 MED FILL — DESCOVY 200-25 MG TABS: 200-25 | 30 days supply | Qty: 30 | Fill #0

## 2016-10-30 MED FILL — RITONAVIR 100 MG TAB: 100 | 30 days supply | Qty: 30 | Fill #0

## 2016-10-30 MED FILL — ATAZANAVIR SULFATE 300 MG C: 300 | 30 days supply | Qty: 30 | Fill #0

## 2016-10-30 MED FILL — TIVICAY 50 MG TABLET: 50 | 30 days supply | Qty: 30 | Fill #0

## 2016-11-01 ENCOUNTER — Other Ambulatory Visit: Payer: Medicare HMO

## 2016-11-05 DIAGNOSIS — Z125 Encounter for screening for malignant neoplasm of prostate: Secondary | ICD-10-CM | POA: Diagnosis not present

## 2016-11-05 DIAGNOSIS — R69 Illness, unspecified: Secondary | ICD-10-CM | POA: Diagnosis not present

## 2016-11-05 DIAGNOSIS — E119 Type 2 diabetes mellitus without complications: Secondary | ICD-10-CM | POA: Diagnosis not present

## 2016-11-05 DIAGNOSIS — B009 Herpesviral infection, unspecified: Secondary | ICD-10-CM | POA: Diagnosis not present

## 2016-11-05 DIAGNOSIS — G4733 Obstructive sleep apnea (adult) (pediatric): Secondary | ICD-10-CM | POA: Diagnosis not present

## 2016-11-05 DIAGNOSIS — N529 Male erectile dysfunction, unspecified: Secondary | ICD-10-CM | POA: Diagnosis not present

## 2016-11-05 DIAGNOSIS — E7800 Pure hypercholesterolemia, unspecified: Secondary | ICD-10-CM | POA: Diagnosis not present

## 2016-11-05 DIAGNOSIS — Z6841 Body Mass Index (BMI) 40.0 and over, adult: Secondary | ICD-10-CM | POA: Diagnosis not present

## 2016-11-05 DIAGNOSIS — Z79899 Other long term (current) drug therapy: Secondary | ICD-10-CM | POA: Diagnosis not present

## 2016-11-05 DIAGNOSIS — I10 Essential (primary) hypertension: Secondary | ICD-10-CM | POA: Diagnosis not present

## 2016-11-05 DIAGNOSIS — E291 Testicular hypofunction: Secondary | ICD-10-CM | POA: Diagnosis not present

## 2016-11-21 ENCOUNTER — Ambulatory Visit (INDEPENDENT_AMBULATORY_CARE_PROVIDER_SITE_OTHER): Payer: Medicare HMO | Admitting: Internal Medicine

## 2016-11-21 ENCOUNTER — Encounter: Payer: Self-pay | Admitting: Internal Medicine

## 2016-11-21 ENCOUNTER — Ambulatory Visit: Payer: Medicare HMO

## 2016-11-21 ENCOUNTER — Ambulatory Visit (INDEPENDENT_AMBULATORY_CARE_PROVIDER_SITE_OTHER): Payer: Medicare HMO | Admitting: Pharmacist

## 2016-11-21 VITALS — BP 144/85 | HR 90 | Temp 98.0°F | Ht 73.0 in | Wt 397.0 lb

## 2016-11-21 DIAGNOSIS — E6609 Other obesity due to excess calories: Secondary | ICD-10-CM | POA: Diagnosis not present

## 2016-11-21 DIAGNOSIS — Z68.41 Body mass index (BMI) pediatric, greater than or equal to 95th percentile for age: Secondary | ICD-10-CM

## 2016-11-21 DIAGNOSIS — Z23 Encounter for immunization: Secondary | ICD-10-CM

## 2016-11-21 DIAGNOSIS — R69 Illness, unspecified: Secondary | ICD-10-CM | POA: Diagnosis not present

## 2016-11-21 DIAGNOSIS — B20 Human immunodeficiency virus [HIV] disease: Secondary | ICD-10-CM | POA: Diagnosis not present

## 2016-11-21 DIAGNOSIS — E291 Testicular hypofunction: Secondary | ICD-10-CM | POA: Diagnosis not present

## 2016-11-21 DIAGNOSIS — Z7189 Other specified counseling: Secondary | ICD-10-CM

## 2016-11-21 DIAGNOSIS — Z7185 Encounter for immunization safety counseling: Secondary | ICD-10-CM | POA: Insufficient documentation

## 2016-11-21 DIAGNOSIS — E669 Obesity, unspecified: Secondary | ICD-10-CM | POA: Insufficient documentation

## 2016-11-21 DIAGNOSIS — R768 Other specified abnormal immunological findings in serum: Secondary | ICD-10-CM | POA: Diagnosis not present

## 2016-11-21 MED ORDER — ATAZANAVIR-COBICISTAT 300-150 MG PO TABS: 1.0000 | ORAL_TABLET | Freq: Every day | ORAL | 11 refills | Status: DC

## 2016-11-21 MED FILL — EVOTAZ 300 MG-150 MG TABLET: 300-150 | 30 days supply | Qty: 30 | Fill #0

## 2016-11-21 NOTE — Assessment & Plan Note (Signed)
I again counseled him on compliance, particularly with known resistance mutations.  He voiced his understanding and will try to take daily.  I will change Reyataz/norvir to [employer].   I will recheck his vl today and rtc 3 months.

## 2016-11-21 NOTE — Assessment & Plan Note (Signed)
conseled on Menveo and given today
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
## 2016-11-21 NOTE — Progress Notes (Signed)
   Subjective:    Patient ID: Vincent Young, male    DOB: [birth year], 51 y.o.   MRN: [id number]  HPI Here for follow up of HIV He comes in on Tivicay, Descovy with Reyataz and norvir with very sporadic compliance.  He states he had been taking it 3-4 times per week rather than daily and did not feel it was necessary to take daily as he was 'testing' its effectiveness.  He has expressed that he feels that there is a cure for HIV and the drug companies are keeping it secret.  He though says now he is taking it daily though misses about once per week.  He also was diagnosed with diabetes recently and is due for arthroscopic knee surgery.  He has no associated n/v/d.  No rash.     Review of Systems  Constitutional: Negative for fatigue.  Gastrointestinal: Negative for diarrhea and nausea.  Neurological: Negative for dizziness.       Objective:   Physical Exam  Constitutional: He appears well-developed and well-nourished. No distress.  HENT:  Mouth/Throat: No oropharyngeal exudate.  Eyes: No scleral icterus.  Cardiovascular: Normal rate, regular rhythm and normal heart sounds.   No murmur heard. Pulmonary/Chest: Effort normal and breath sounds normal. No respiratory distress.  Lymphadenopathy:    He has no cervical adenopathy.  Skin: No rash noted.    SH: past smoker      Assessment & Plan:

## 2016-11-21 NOTE — Addendum Note (Signed)
Addended by: Myrtis Hopping A on: 11/21/2016 12:27 PM   Modules accepted: Orders

## 2016-11-21 NOTE — Assessment & Plan Note (Signed)
I discussed with him now he is known to have diabetes and weight loss will be important and I discussed decreasing carbs, exercising.

## 2016-11-25 LAB — HIV-1 RNA QUANT-NO REFLEX-BLD
HIV 1 RNA Quant: 55 copies/mL — ABNORMAL HIGH
HIV-1 RNA Quant, Log: 1.74 Log copies/mL — ABNORMAL HIGH

## 2016-11-27 MED FILL — TIVICAY 50 MG TABLET: 50 | 30 days supply | Qty: 30 | Fill #1

## 2016-11-27 MED FILL — VALACYCLOVIR HCL 500 MG TAB: 500 | 30 days supply | Qty: 60 | Fill #2

## 2016-11-27 MED FILL — DESCOVY 200-25 MG TABS: 200-25 | 30 days supply | Qty: 30 | Fill #1

## 2016-12-06 DIAGNOSIS — E291 Testicular hypofunction: Secondary | ICD-10-CM | POA: Diagnosis not present

## 2016-12-23 DIAGNOSIS — E291 Testicular hypofunction: Secondary | ICD-10-CM | POA: Diagnosis not present

## 2016-12-24 ENCOUNTER — Other Ambulatory Visit: Payer: Self-pay | Admitting: Pharmacist

## 2016-12-26 ENCOUNTER — Telehealth: Payer: Self-pay | Admitting: Pharmacist Clinician (PhC)/ Clinical Pharmacy Specialist

## 2016-12-26 NOTE — Telephone Encounter (Signed)
Vincent Young called to say that he is having some nightmares with his meds. He currently is on Evotaz/Descovy/DTG. He takes them at night. Told him to move all 3 to breakfast instead. None of these really should cause nightmares anyway.

## 2016-12-30 ENCOUNTER — Other Ambulatory Visit: Payer: Self-pay | Admitting: Internal Medicine

## 2016-12-30 DIAGNOSIS — B20 Human immunodeficiency virus [HIV] disease: Secondary | ICD-10-CM

## 2016-12-30 MED FILL — VALACYCLOVIR HCL 500 MG TAB: 500 | 30 days supply | Qty: 60 | Fill #0

## 2016-12-30 MED FILL — EVOTAZ 300 MG-150 MG TABLET: 300-150 | 30 days supply | Qty: 30 | Fill #1

## 2016-12-30 MED FILL — TIVICAY 50 MG TABLET: 50 | 30 days supply | Qty: 30 | Fill #2

## 2016-12-30 MED FILL — DESCOVY 200-25 MG TABS: 200-25 | 30 days supply | Qty: 30 | Fill #2

## 2017-01-27 DIAGNOSIS — E291 Testicular hypofunction: Secondary | ICD-10-CM | POA: Diagnosis not present

## 2017-01-28 MED FILL — TIVICAY 50 MG TABLET: 50 | 30 days supply | Qty: 30 | Fill #3

## 2017-01-28 MED FILL — VALACYCLOVIR HCL 500 MG TAB: 500 | 30 days supply | Qty: 60 | Fill #1

## 2017-01-28 MED FILL — DESCOVY 200-25 MG TABS: 200-25 | 30 days supply | Qty: 30 | Fill #3

## 2017-01-28 MED FILL — EVOTAZ 300 MG-150 MG TABLET: 300-150 | 30 days supply | Qty: 30 | Fill #2

## 2017-02-17 DIAGNOSIS — E291 Testicular hypofunction: Secondary | ICD-10-CM | POA: Diagnosis not present

## 2017-02-20 DIAGNOSIS — I10 Essential (primary) hypertension: Secondary | ICD-10-CM | POA: Diagnosis not present

## 2017-02-20 DIAGNOSIS — E119 Type 2 diabetes mellitus without complications: Secondary | ICD-10-CM | POA: Diagnosis not present

## 2017-02-20 DIAGNOSIS — R7989 Other specified abnormal findings of blood chemistry: Secondary | ICD-10-CM | POA: Diagnosis not present

## 2017-02-20 DIAGNOSIS — Z5181 Encounter for therapeutic drug level monitoring: Secondary | ICD-10-CM | POA: Diagnosis not present

## 2017-02-20 DIAGNOSIS — L603 Nail dystrophy: Secondary | ICD-10-CM | POA: Diagnosis not present

## 2017-02-20 DIAGNOSIS — E7800 Pure hypercholesterolemia, unspecified: Secondary | ICD-10-CM | POA: Diagnosis not present

## 2017-02-20 DIAGNOSIS — E291 Testicular hypofunction: Secondary | ICD-10-CM | POA: Diagnosis not present

## 2017-02-20 DIAGNOSIS — G4733 Obstructive sleep apnea (adult) (pediatric): Secondary | ICD-10-CM | POA: Diagnosis not present

## 2017-02-20 DIAGNOSIS — N529 Male erectile dysfunction, unspecified: Secondary | ICD-10-CM | POA: Diagnosis not present

## 2017-02-21 DIAGNOSIS — R7989 Other specified abnormal findings of blood chemistry: Secondary | ICD-10-CM | POA: Diagnosis not present

## 2017-02-21 DIAGNOSIS — E7800 Pure hypercholesterolemia, unspecified: Secondary | ICD-10-CM | POA: Diagnosis not present

## 2017-02-21 DIAGNOSIS — R748 Abnormal levels of other serum enzymes: Secondary | ICD-10-CM | POA: Diagnosis not present

## 2017-02-21 DIAGNOSIS — N529 Male erectile dysfunction, unspecified: Secondary | ICD-10-CM | POA: Diagnosis not present

## 2017-02-21 DIAGNOSIS — Z5181 Encounter for therapeutic drug level monitoring: Secondary | ICD-10-CM | POA: Diagnosis not present

## 2017-02-21 DIAGNOSIS — E119 Type 2 diabetes mellitus without complications: Secondary | ICD-10-CM | POA: Diagnosis not present

## 2017-02-21 DIAGNOSIS — Z6841 Body Mass Index (BMI) 40.0 and over, adult: Secondary | ICD-10-CM | POA: Diagnosis not present

## 2017-02-21 DIAGNOSIS — I10 Essential (primary) hypertension: Secondary | ICD-10-CM | POA: Diagnosis not present

## 2017-02-21 DIAGNOSIS — L603 Nail dystrophy: Secondary | ICD-10-CM | POA: Diagnosis not present

## 2017-02-21 DIAGNOSIS — G4733 Obstructive sleep apnea (adult) (pediatric): Secondary | ICD-10-CM | POA: Diagnosis not present

## 2017-02-21 DIAGNOSIS — E291 Testicular hypofunction: Secondary | ICD-10-CM | POA: Diagnosis not present

## 2017-03-01 ENCOUNTER — Emergency Department (HOSPITAL_COMMUNITY)
Admission: EM | Admit: 2017-03-01 | Discharge: 2017-03-01 | Disposition: A | Discharge status: Home / Self Care | Payer: Medicare HMO | Attending: Emergency Medicine | Admitting: Emergency Medicine

## 2017-03-01 ENCOUNTER — Encounter (HOSPITAL_COMMUNITY): Payer: Self-pay | Admitting: *Deleted

## 2017-03-01 DIAGNOSIS — R200 Anesthesia of skin: Secondary | ICD-10-CM | POA: Insufficient documentation

## 2017-03-01 DIAGNOSIS — Z87891 Personal history of nicotine dependence: Secondary | ICD-10-CM | POA: Insufficient documentation

## 2017-03-01 DIAGNOSIS — B20 Human immunodeficiency virus [HIV] disease: Secondary | ICD-10-CM | POA: Insufficient documentation

## 2017-03-01 DIAGNOSIS — Z79899 Other long term (current) drug therapy: Secondary | ICD-10-CM | POA: Insufficient documentation

## 2017-03-01 DIAGNOSIS — I10 Essential (primary) hypertension: Secondary | ICD-10-CM | POA: Diagnosis not present

## 2017-03-01 DIAGNOSIS — E119 Type 2 diabetes mellitus without complications: Secondary | ICD-10-CM | POA: Insufficient documentation

## 2017-03-01 DIAGNOSIS — R69 Illness, unspecified: Secondary | ICD-10-CM | POA: Diagnosis not present

## 2017-03-01 DIAGNOSIS — J45909 Unspecified asthma, uncomplicated: Secondary | ICD-10-CM | POA: Insufficient documentation

## 2017-03-01 HISTORY — DX: Type 2 diabetes mellitus without complications: E11.9

## 2017-03-01 NOTE — ED Notes (Signed)
Got patient into a gown on the monitor patient is resting with family at bedside and call bell in reach 

## 2017-03-01 NOTE — Discharge Instructions (Signed)
It was my pleasure taking care of you today! If your numbness has not resolved by Monday, please call your orthopedist to schedule a follow up appointment.  Return to ER for new or worsening symptoms, any additional concerns.

## 2017-03-01 NOTE — ED Triage Notes (Signed)
Pt reports onset 2pm yesterday of left leg numbness. No numbness or weakness to arm. Hx of back problems but states it was years ago. Denies any pain today. No acute distress is noted at triage.

## 2017-03-01 NOTE — ED Provider Notes (Signed)
[facility] DEPT Provider Note   CSN: [id number] Arrival date & time: 03/01/17  6073     History   Chief Complaint Chief Complaint  Patient presents with  . Numbness    HPI Vincent Young is a 51 y.o. male.  The history is provided by the patient and medical records. No language interpreter was used.   Vincent Young is a 51 y.o. male  with a PMH of HIV, HTN, DM, degenerative disc disease who presents to the Emergency Department complaining of constant numbness to lateral left knee which began this morning. Patient states that he works [employer], but is typically at an assignment where he stands. Yesterday, he was placed at a different assignment where he had to stand for 8 hours. After the first couple of hours, his left knee started bothering him. When he got home, he put knee up and took Aleve which relieved the pain. This morning, when he awoke, he noticed numbness to the side of his knee that he describes as feeling "dead". No back pain or knee pain. No trouble moving the knee or problems ambulating. No alleviating / aggravating factors for numbness noted.   Past Medical History:  Diagnosis Date  . Acute renal failure (HCC)    due to dehydration  . Arthritis   . Asthma   . Diabetes mellitus without complication ([place])   . DJD (degenerative joint disease)   . Exposure to TB   . Family history of adverse reaction to anesthesia    " My brother has had PONV"  . GERD (gastroesophageal reflux disease)   . Headache   . HIV infection ([place])   . Hypertension   . Obesity   . Pilonidal cyst   . Recurrent genital HSV (herpes simplex virus) infection   . Sleep apnea    never tested-has s/s    Patient Active Problem List   Diagnosis Date Noted  . Obesity due to excess calories with serious comorbidity and body mass index (BMI) greater than 99th percentile for age in pediatric patient 11/21/2016  . 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 Vaccine counseling 11/21/2016  . Screening  examination for venereal disease 05/31/2014  . Encounter for long-term (current) use of medications 05/31/2014  . Bronchitis, allergic 10/19/2013  . Healthcare maintenance 02/02/2013  . PERIPHERAL NEUROPATHY 11/02/2009  . Herpes simplex virus (HSV) infection 11/01/2009  . TONSILLECTOMY, HX OF 11/01/2009  . Human immunodeficiency virus (HIV) disease ([place]) 10/12/2009    Past Surgical History:  Procedure Laterality Date  . APPENDECTOMY    . 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 COLONOSCOPY W/ BIOPSIES AND POLYPECTOMY    . COLONOSCOPY WITH PROPOFOL N/A 08/29/2014   Procedure: COLONOSCOPY WITH PROPOFOL;  Surgeon: Clarene Essex, MD;  Location: [facility] ENDOSCOPY;  Service: Endoscopy;  Laterality: N/A;  . HERNIA REPAIR     inguinal as a child  . PEG TUBE PLACEMENT  2009  . TONSILLECTOMY    . UVULECTOMY  2009       Home Medications    Prior to Admission medications   Medication Sig Start Date End Date Taking? Authorizing Provider  albuterol (PROVENTIL HFA;VENTOLIN HFA) 108 (90 BASE) MCG/ACT inhaler Inhale 1-2 puffs into the lungs every 6 (six) hours as needed for wheezing or shortness of  breath. 05/15/14   Rancour, Annie Main, MD  atazanavir-cobicistat (EVOTAZ) 300-150 MG tablet Take 1 tablet by mouth daily with breakfast. Swallow whole. Do NOT crush, cut or chew tablet. Take with food. 11/21/16   Comer, Okey Regal, MD  diphenhydrAMINE (BENADRYL) 25 MG tablet Take 2 tablets (50 mg total) by mouth every 8 (eight) hours as needed (allergic reaction). 04/03/14   Jola Schmidt, MD  dolutegravir (TIVICAY) 50 MG tablet Take 1 tablet (50 mg total) by mouth daily. 10/30/16   Thayer Headings, MD  emtricitabine-tenofovir AF (DESCOVY) 200-25 MG  tablet Take 1 tablet by mouth daily. 10/30/16   Comer, Okey Regal, MD  ibuprofen (ADVIL,MOTRIN) 800 MG tablet Take 1 tablet (800 mg total) by mouth 3 (three) times daily. 08/14/15   Mackuen, Courteney Lyn, MD  valACYclovir (VALTREX) 500 MG tablet TAKE 2 TABLETS BY MOUTH DAILY. 12/30/16   ComerOkey Regal, MD    Family History Family History  Problem Relation Age of Onset  . Heart disease Father   . Hypertension Father   . Hypertension Mother   . Arthritis Mother   . Hypertension Sister   . Arthritis Sister   . Hypertension Brother     Social History Social History  Substance Use Topics  . Smoking status: Former Smoker    Quit date: 12/04/2003  . Smokeless tobacco: Never Used     Comment: pt. no longer smokes  . Alcohol use 0.0 oz/week     Comment: occasional     Allergies   Shellfish allergy; Other; and Penicillins   Review of Systems Review of Systems  Constitutional: Negative for chills and fever.  HENT: Negative for congestion.   Eyes: Negative for visual disturbance.  Respiratory: Negative for cough and shortness of breath.   Cardiovascular: Negative for chest pain.  Gastrointestinal: Negative for abdominal pain, nausea and vomiting.  Genitourinary: Negative for dysuria.  Musculoskeletal: Negative for back pain and neck pain.  Skin: Negative for rash.  Neurological: Positive for numbness. Negative for weakness and headaches.    Physical Exam Updated Vital Signs BP (!) 137/91   Pulse 81   Temp 98.6 F (37 C) (Oral)   Resp 16   Ht 6\' 1"  (1.854 m)   Wt (!) 175.5 kg (387 lb)   SpO2 94%   BMI 51.06 kg/m   Physical Exam  Constitutional: He is oriented to person, place, and time. He appears well-developed and well-nourished. No distress.  HENT:  Head: Normocephalic and atraumatic.  Cardiovascular: Normal rate, regular rhythm and normal heart sounds.   No murmur heard. 2+ DP pulses.   Pulmonary/Chest: Effort normal and breath sounds normal. No respiratory  distress.  Abdominal: Soft. He exhibits no distension. There is no tenderness.  Musculoskeletal: He exhibits no edema.       Legs: Bilateral lower extremities with full ROM without pain and 5/5 muscle strength. Equal and intact patellar reflexes. Decreased sensation to light left lateral knee as depicted in image when compared to right. No midline C/T/L spine or paraspinal tenderness.  Neurological: He is alert and oriented to person, place, and time.  Skin: Skin is warm and dry.  Nursing note and vitals reviewed.    ED Treatments / Results  Labs (all labs ordered are listed, but only abnormal results are displayed) Labs Reviewed - No data to display  EKG  EKG Interpretation None       Radiology No results found.  Procedures Procedures (including critical care time)  Medications Ordered in ED  Medications - No data to display   Initial Impression / Assessment and Plan / ED Course  I have reviewed the triage vital signs and the nursing notes.  Pertinent labs & imaging results that were available during my care of the patient were reviewed by me and considered in my medical decision making (see chart for details).    Jamil Armwood is a 51 y.o. male who presents to ED for numbness to left lateral knee this morning after prolonged standing approximately 8 hours for work yesterday. This is not his usual work routine. On exam, patient with good patellar reflexes, full ROM, no tenderness to back or hips. No complaints of back pain or pain of any sort. 2+ DP. Able to ambulate without difficulty. Decreased sensation isolated to lateral aspect of the knee and lower thigh. Patient followed by orthopedics for this knee. If symptoms do not resolve by Monday, patient will call to schedule orthopedic follow up. Return precautions discussed and all questions answered.  Patient discussed with Dr. Maryan Rued who agrees with treatment plan.     Final Clinical Impressions(s) / ED Diagnoses     Final diagnoses:  Numbness    New Prescriptions New Prescriptions   No medications on file     Irina Okelly, Ozella Almond, PA-C 03/01/17 1116    Blanchie Dessert, MD 03/02/17 [phone]

## 2017-03-03 ENCOUNTER — Other Ambulatory Visit: Payer: Self-pay | Admitting: Pharmacist

## 2017-03-03 DIAGNOSIS — E291 Testicular hypofunction: Secondary | ICD-10-CM | POA: Diagnosis not present

## 2017-03-03 MED FILL — DESCOVY 200-25 MG TABS: 200-25 | 30 days supply | Qty: 30 | Fill #4

## 2017-03-03 MED FILL — TIVICAY 50 MG TABLET: 50 | 30 days supply | Qty: 30 | Fill #4

## 2017-03-03 MED FILL — EVOTAZ 300 MG-150 MG TABLET: 300-150 | 30 days supply | Qty: 30 | Fill #3

## 2017-03-03 MED FILL — VALACYCLOVIR HCL 500 MG TAB: 500 | 30 days supply | Qty: 60 | Fill #2

## 2017-03-06 ENCOUNTER — Ambulatory Visit: Payer: Self-pay | Admitting: Podiatry

## 2017-03-14 DIAGNOSIS — E291 Testicular hypofunction: Secondary | ICD-10-CM | POA: Diagnosis not present

## 2017-03-20 ENCOUNTER — Encounter: Payer: Self-pay | Admitting: Podiatry

## 2017-03-20 ENCOUNTER — Ambulatory Visit (INDEPENDENT_AMBULATORY_CARE_PROVIDER_SITE_OTHER): Payer: Medicare HMO | Admitting: Podiatry

## 2017-03-20 VITALS — BP 132/79 | HR 95

## 2017-03-20 DIAGNOSIS — B353 Tinea pedis: Secondary | ICD-10-CM | POA: Diagnosis not present

## 2017-03-20 DIAGNOSIS — E119 Type 2 diabetes mellitus without complications: Secondary | ICD-10-CM

## 2017-03-20 DIAGNOSIS — B351 Tinea unguium: Secondary | ICD-10-CM

## 2017-03-20 MED ORDER — KETOCONAZOLE 2 % EX CREA: TOPICAL_CREAM | CUTANEOUS | 0 refills | Status: DC

## 2017-03-20 NOTE — Patient Instructions (Signed)
Fungal Nail Infection Fungal nail infection is a common fungal infection of the toenails or fingernails. This condition affects toenails more often than fingernails. More than one nail may be infected. The condition can be passed from person to person (is contagious). What are the causes? This condition is caused by a fungus. Several types of funguses can cause the infection. These funguses are common in moist and warm areas. If your hands or feet come into contact with the fungus, it may get into a crack in your fingernail or toenail and cause the infection. What increases the risk? The following factors may make you more likely to develop this condition:  Being male.  Having diabetes.  Being of older age.  Living with someone who has the fungus.  Walking barefoot in areas where the fungus thrives, such as showers or locker rooms.  Having poor circulation.  Wearing shoes and socks that cause your feet to sweat.  Having athlete's foot.  Having a nail injury or history of a recent nail surgery.  Having psoriasis.  Having a weak body defense system (immune system).  What are the signs or symptoms? Symptoms of this condition include:  A pale spot on the nail.  Thickening of the nail.  A nail that becomes yellow or brown.  A brittle or ragged nail edge.  A crumbling nail.  A nail that has lifted away from the nail bed.  How is this diagnosed? This condition is diagnosed with a physical exam. Your health care provider may take a scraping or clipping from your nail to test for the fungus. How is this treated? Mild infections do not need treatment. If you have significant nail changes, treatment may include:  Oral antifungal medicines. You may need to take the medicine for several weeks or several months, and you may not see the results for a long time. These medicines can cause side effects. Ask your health care provider what problems to watch for.  Antifungal nail polish  and nail cream. These may be used along with oral antifungal medicines.  Laser treatment of the nail.  Surgery to remove the nail. This may be needed for the most severe infections.  Treatment takes a long time, and the infection may come back. Follow these instructions at home: Medicines  Take or apply over-the-counter and prescription medicines only as told by your health care provider.  Ask your health care provider about using over-the-counter mentholated ointment on your nails. Lifestyle   Do not share personal items, such as towels or nail clippers.  Trim your nails often.  Wash and dry your hands and feet every day.  Wear absorbent socks, and change your socks frequently.  Wear shoes that allow air to circulate, such as sandals or canvas tennis shoes. Throw out old shoes.  Wear rubber gloves if you are working with your hands in wet areas.  Do not walk barefoot in shower rooms or locker rooms.  Do not use a nail salon that does not use clean instruments.  Do not use artificial nails. General instructions  Keep all follow-up visits as told by your health care provider. This is important.  Use antifungal foot powder on your feet and in your shoes. Contact a health care provider if: Your infection is not getting better or it is getting worse after several months. This information is not intended to replace advice given to you by your health care provider. Make sure you discuss any questions you have with your health   care provider. Document Released: 05/03/2000 Document Revised: 10/12/2015 Document Reviewed: 11/07/2014 Elsevier Interactive Patient Education  2018 [place] Athlete's foot (tinea pedis) is a fungal infection of the skin on the feet. It often occurs on the skin that is between or underneath the toes. It can also occur on the soles of the feet. The infection can spread from person to person (is contagious). Follow these instructions at  home:  Apply or take over-the-counter and prescription medicines only as told by your doctor.  Keep all follow-up visits as told by your doctor. This is important.  Do not scratch your feet.  Keep your feet dry: ? Wear cotton or wool socks. Change your socks every day or if they become wet. ? Wear shoes that allow air to move around, such as sandals or canvas tennis shoes.  Wash and dry your feet: ? Every day or as told by your doctor. ? After exercising. ? Including the area between your toes.  Wear sandals in wet areas, such as locker rooms and shared showers.  Do not share any of these items: ? Towels. ? Nail clippers. ? Other personal items that touch your feet.  If you have diabetes, keep your blood sugar under control. Contact a doctor if:  You have a fever.  You have swelling, soreness, warmth, or redness in your foot.  You are not getting better with treatment.  Your symptoms get worse.  You have new symptoms. This information is not intended to replace advice given to you by your health care provider. Make sure you discuss any questions you have with your health care provider. Document Released: 10/23/2007 Document Revised: 10/12/2015 Document Reviewed: 11/07/2014 Elsevier Interactive Patient Education  2018 [employer]. Diabetes and Foot Care Diabetes may cause you to have problems because of poor blood supply (circulation) to your feet and legs. This may cause the skin on your feet to become thinner, break easier, and heal more slowly. Your skin may become dry, and the skin may peel and crack. You may also have nerve damage in your legs and feet causing decreased feeling in them. You may not notice minor injuries to your feet that could lead to infections or more serious problems. Taking care of your feet is one of the most important things you can do for yourself. Follow these instructions at home:  Wear shoes at all times, even in the house. Do not go  barefoot. Bare feet are easily injured.  Check your feet daily for blisters, cuts, and redness. If you cannot see the bottom of your feet, use a mirror or ask someone for help.  Wash your feet with warm water (do not use hot water) and mild soap. Then pat your feet and the areas between your toes until they are completely dry. Do not soak your feet as this can dry your skin.  Apply a moisturizing lotion or petroleum jelly (that does not contain alcohol and is unscented) to the skin on your feet and to dry, brittle toenails. Do not apply lotion between your toes.  Trim your toenails straight across. Do not dig under them or around the cuticle. File the edges of your nails with an emery board or nail file.  Do not cut corns or calluses or try to remove them with medicine.  Wear clean socks or stockings every day. Make sure they are not too tight. Do not wear knee-high stockings since they may decrease blood flow to your legs.  Wear  shoes that fit properly and have enough cushioning. To break in new shoes, wear them for just a few hours a day. This prevents you from injuring your feet. Always look in your shoes before you put them on to be sure there are no objects inside.  Do not cross your legs. This may decrease the blood flow to your feet.  If you find a minor scrape, cut, or break in the skin on your feet, keep it and the skin around it clean and dry. These areas may be cleansed with mild soap and water. Do not cleanse the area with peroxide, alcohol, or iodine.  When you remove an adhesive bandage, be sure not to damage the skin around it.  If you have a wound, look at it several times a day to make sure it is healing.  Do not use heating pads or hot water bottles. They may burn your skin. If you have lost feeling in your feet or legs, you may not know it is happening until it is too late.  Make sure your health care provider performs a complete foot exam at least annually or more often  if you have foot problems. Report any cuts, sores, or bruises to your health care provider immediately. Contact a health care provider if:  You have an injury that is not healing.  You have cuts or breaks in the skin.  You have an ingrown nail.  You notice redness on your legs or feet.  You feel burning or tingling in your legs or feet.  You have pain or cramps in your legs and feet.  Your legs or feet are numb.  Your feet always feel cold. Get help right away if:  There is increasing redness, swelling, or pain in or around a wound.  There is a red line that goes up your leg.  Pus is coming from a wound.  You develop a fever or as directed by your health care provider.  You notice a bad smell coming from an ulcer or wound. This information is not intended to replace advice given to you by your health care provider. Make sure you discuss any questions you have with your health care provider. Document Released: 05/03/2000 Document Revised: 10/12/2015 Document Reviewed: 10/13/2012 Elsevier Interactive Patient Education  2017 [employer].

## 2017-03-20 NOTE — Progress Notes (Signed)
Subjective:  Patient ID: Vincent Young, male    DOB: 1966/03/06,  MRN: [id number]  Chief Complaint  Patient presents with  . Diabetes    Diabetic foot exam   . Skin Problem    Dry skin on bottom of feet     51 y.o. male presents with the above complaint. Reports cracking on the bottom of the feet.   Past Medical History:  Diagnosis Date  . Acute renal failure (HCC)    due to dehydration  . Arthritis   . Asthma   . Diabetes mellitus without complication ([place])   . DJD (degenerative joint disease)   . Exposure to TB   . Family history of adverse reaction to anesthesia    " My brother has had PONV"  . GERD (gastroesophageal reflux disease)   . Headache   . HIV infection ([place])   . Hypertension   . Obesity   . Pilonidal cyst   . Recurrent genital HSV (herpes simplex virus) infection   . Sleep apnea    never tested-has s/s   Past Surgical History:  Procedure Laterality Date  . APPENDECTOMY    . 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 COLONOSCOPY W/ BIOPSIES AND POLYPECTOMY    . COLONOSCOPY WITH PROPOFOL N/A 08/29/2014   Procedure: COLONOSCOPY WITH PROPOFOL;  Surgeon: Clarene Essex, MD;  Location: [facility] ENDOSCOPY;  Service: Endoscopy;  Laterality: N/A;  . HERNIA REPAIR     inguinal as a child  . PEG TUBE PLACEMENT  2009  . TONSILLECTOMY    . UVULECTOMY  2009    Current Outpatient Prescriptions:  .  albuterol (PROVENTIL HFA;VENTOLIN HFA) 108 (90 BASE) MCG/ACT inhaler, Inhale 1-2 puffs into the lungs every 6 (six) hours as needed for wheezing or shortness of breath., Disp: 1 Inhaler, Rfl: 0 .  atazanavir-cobicistat (EVOTAZ) 300-150 MG tablet, Take 1 tablet by mouth daily with breakfast. Swallow whole. Do NOT crush,  cut or chew tablet. Take with food., Disp: 30 tablet, Rfl: 11 .  diphenhydrAMINE (BENADRYL) 25 MG tablet, Take 2 tablets (50 mg total) by mouth every 8 (eight) hours as needed (allergic reaction)., Disp: 12 tablet, Rfl: 0 .  dolutegravir (TIVICAY) 50 MG tablet, Take 1 tablet (50 mg total) by mouth daily., Disp: 30 tablet, Rfl: 5 .  emtricitabine-tenofovir AF (DESCOVY) 200-25 MG tablet, Take 1 tablet by mouth daily., Disp: 30 tablet, Rfl: 5 .  ibuprofen (ADVIL,MOTRIN) 800 MG tablet, Take 1 tablet (800 mg total) by mouth 3 (three) times daily., Disp: 21 tablet, Rfl: 0 .  ketoconazole (NIZORAL) 2 % cream, Apply 1 fingertip amount to each foot daily., Disp: 30 g, Rfl: 0 .  valACYclovir (VALTREX) 500 MG tablet, TAKE 2 TABLETS BY MOUTH DAILY., Disp: 60 tablet, Rfl: 3  Allergies  Allergen Reactions  . Shellfish Allergy Anaphylaxis  . Other Other (See Comments)    Tree nuts cause throat swelling  . Penicillins Rash   Review of Systems  All reviewed and negative except as noted in the HPI. Objective:   Vitals:   03/20/17 1107  BP: 132/79  Pulse: 95   General AA&O x3. Normal mood and affect.  Vascular Dorsalis pedis and posterior tibial pulses  present 2+ and present 1+ bilaterally  Capillary refill normal to  all digits. Pedal hair growth normal.  Neurologic Epicritic sensation present bilaterally. Protective sensation with 5.07 monofilament  present bilaterally. Vibratory sensation present bilaterally.  Dermatologic Xerosis with scaling to the plantar foot bilat. Normal skin temperature and turgor. Hyperkeratotic lesions: none bilaterally. Nails: brittle, discoloration dark brown, onychomycosis, thickening  Orthopedic: No history of amputation. MMT 5/5 in dorsiflexion, plantarflexion, inversion, and eversion. Normal lower extremity joint ROM without pain or crepitus.    Assessment & Plan:  Patient was evaluated and treated and all questions answered.  Diabetes without  Complication, -Educated on diabetic footcare. Diabetic risk level 0 -Does not qualify for routine foot care.  Onychomycosis, Tinea Pedis -Educated on nail fungus etiology and treatment. -eRx Ketoconazole.  Follow up annually for DMFC.

## 2017-03-20 NOTE — Progress Notes (Signed)
   Subjective:    Patient ID: Vincent Young, male    DOB: 01/17/1966, 51 y.o.   MRN: [id number]  HPI    Review of Systems  All other systems reviewed and are negative.      Objective:   Physical Exam        Assessment & Plan:

## 2017-03-27 MED FILL — TIVICAY 50 MG TABLET: 50 | 30 days supply | Qty: 30 | Fill #5

## 2017-03-27 MED FILL — EVOTAZ 300 MG-150 MG TABLET: 300-150 | 30 days supply | Qty: 30 | Fill #4

## 2017-03-27 MED FILL — DESCOVY 200-25 MG TABS: 200-25 | 30 days supply | Qty: 30 | Fill #5

## 2017-04-07 DIAGNOSIS — E291 Testicular hypofunction: Secondary | ICD-10-CM | POA: Diagnosis not present

## 2017-04-15 ENCOUNTER — Telehealth: Payer: Self-pay | Admitting: *Deleted

## 2017-04-15 NOTE — Telephone Encounter (Signed)
Patient called asking if Dr.Comer had ever prescribed an inhaler for him. Looks like he got one in 2015 from the ED. Patient has an upcoming appt with his primary and he will request it then. He said he is having congestion and some wheezing.

## 2017-04-28 ENCOUNTER — Ambulatory Visit: Payer: Medicare HMO

## 2017-05-02 DIAGNOSIS — E291 Testicular hypofunction: Secondary | ICD-10-CM | POA: Diagnosis not present

## 2017-05-09 DIAGNOSIS — M170 Bilateral primary osteoarthritis of knee: Secondary | ICD-10-CM | POA: Diagnosis not present

## 2017-05-14 ENCOUNTER — Other Ambulatory Visit: Payer: Self-pay | Admitting: Pharmacist Clinician (PhC)/ Clinical Pharmacy Specialist

## 2017-05-14 DIAGNOSIS — B20 Human immunodeficiency virus [HIV] disease: Secondary | ICD-10-CM

## 2017-05-14 MED ORDER — VALACYCLOVIR HCL 500 MG PO TABS: 1000.0000 mg | ORAL_TABLET | Freq: Every day | ORAL | 2 refills | Status: DC

## 2017-05-14 MED ORDER — DOLUTEGRAVIR SODIUM 50 MG PO TABS: 50.0000 mg | ORAL_TABLET | Freq: Every day | ORAL | 1 refills | Status: DC

## 2017-05-14 MED ORDER — EMTRICITABINE-TENOFOVIR AF 200-25 MG PO TABS: 1.0000 | ORAL_TABLET | Freq: Every day | ORAL | 1 refills | Status: DC

## 2017-05-14 MED FILL — TIVICAY 50 MG TABLET: 50 | 30 days supply | Qty: 30 | Fill #0

## 2017-05-14 MED FILL — DESCOVY 200-25 MG TABS: 200-25 | 30 days supply | Qty: 30 | Fill #0

## 2017-05-14 MED FILL — EVOTAZ 300 MG-150 MG TABLET: 300-150 | 30 days supply | Qty: 30 | Fill #5

## 2017-05-14 MED FILL — VALACYCLOVIR HCL 500 MG TAB: 500 | 30 days supply | Qty: 60 | Fill #0

## 2017-05-14 NOTE — Progress Notes (Signed)
Refills until appt

## 2017-05-21 ENCOUNTER — Ambulatory Visit (INDEPENDENT_AMBULATORY_CARE_PROVIDER_SITE_OTHER): Payer: Medicare HMO | Admitting: Pharmacist Clinician (PhC)/ Clinical Pharmacy Specialist

## 2017-05-21 ENCOUNTER — Other Ambulatory Visit (HOSPITAL_COMMUNITY)
Admission: RE | Admit: 2017-05-21 | Discharge: 2017-05-21 | Disposition: A | Discharge status: Home / Self Care | Payer: Medicare HMO | Source: Ambulatory Visit | Attending: Internal Medicine | Admitting: Internal Medicine

## 2017-05-21 DIAGNOSIS — B20 Human immunodeficiency virus [HIV] disease: Secondary | ICD-10-CM

## 2017-05-21 DIAGNOSIS — R69 Illness, unspecified: Secondary | ICD-10-CM | POA: Diagnosis not present

## 2017-05-21 LAB — CBC
HCT: 43.3 % (ref 38.5–50.0)
Hemoglobin: 14.8 g/dL (ref 13.2–17.1)
MCH: 30.6 pg (ref 27.0–33.0)
MCHC: 34.2 g/dL (ref 32.0–36.0)
MCV: 89.5 fL (ref 80.0–100.0)
MPV: 9 fL (ref 7.5–12.5)
Platelets: 295 10*3/uL (ref 140–400)
RBC: 4.84 10*6/uL (ref 4.20–5.80)
RDW: 15.1 % — ABNORMAL HIGH (ref 11.0–15.0)
WBC: 9.7 10*3/uL (ref 3.8–10.8)

## 2017-05-21 MED ORDER — DOLUTEGRAVIR SODIUM 50 MG PO TABS: 50.0000 mg | ORAL_TABLET | Freq: Every day | ORAL | 5 refills | Status: DC

## 2017-05-21 MED ORDER — DARUN-COBIC-EMTRICIT-TENOFAF 800-150-200-10 MG PO TABS: 1.0000 | ORAL_TABLET | Freq: Every day | ORAL | 5 refills | Status: DC

## 2017-05-21 MED FILL — SYMTUZA 800-150-200-10 MG T: 800-150-200 | 30 days supply | Qty: 30 | Fill #0

## 2017-05-21 NOTE — Progress Notes (Signed)
HPI: Courage Biglow is a 52 y.o. male who is here to f/u with pharmacy for his adherence issue with ART  Allergies: Allergies  Allergen Reactions  . Shellfish Allergy Anaphylaxis  . Other Other (See Comments)    Tree nuts cause throat swelling  . Penicillins Rash    Vitals:    Past Medical History: Past Medical History:  Diagnosis Date  . Acute renal failure (HCC)    due to dehydration  . Arthritis   . Asthma   . Diabetes mellitus without complication ([place])   . DJD (degenerative joint disease)   . Exposure to TB   . Family history of adverse reaction to anesthesia    " My brother has had PONV"  . GERD (gastroesophageal reflux disease)   . Headache   . HIV infection ([place])   . Hypertension   . Obesity   . Pilonidal cyst   . Recurrent genital HSV (herpes simplex virus) infection   . Sleep apnea    never tested-has s/s    Social History: Social History   Socioeconomic History  . Marital status: Married    Spouse name: Not on file  . Number of children: Not on file  . Years of education: Not on file  . Highest education level: Not on file  Social Needs  . Financial resource strain: Not on file  . Food insecurity - worry: Not on file  . Food insecurity - inability: Not on file  . Transportation needs - medical: Not on file  . Transportation needs - non-medical: Not on file  Occupational History  . Not on file  Tobacco Use  . Smoking status: Former Smoker    Last attempt to quit: 12/04/2003    Years since quitting: 13.4  . Smokeless tobacco: Never Used  . Tobacco comment: pt. no longer smokes  Substance and Sexual Activity  . Alcohol use: Yes    Alcohol/week: 0.0 oz    Comment: occasional  . Drug use: Yes    Types: Marijuana    Comment: occasional  . Sexual activity: Yes    Partners: Female    Comment: pt. declined condoms  Other Topics Concern  . Not on file  Social History Narrative  . Not on file    Previous Regimen:   Current  Regimen: Descovy/DTG/Evotaz  Labs: HIV 1 RNA Quant (copies/mL)  Date Value  11/21/2016 55 (H)  04/18/2016 112 (H)  01/24/2016 159 (H)   HIV-1 RNA Viral Load (no units)  Date Value  06/08/2013 <40  05/11/2013 <40  02/16/2013 <40   CD4 (no units)  Date Value  06/08/2013 480  05/11/2013 691  02/16/2013 576   CD4 T Cell Abs (/uL)  Date Value  10/24/2016 740  04/18/2016 800  12/28/2015 700   Hep B S Ab (no units)  Date Value  11/24/2014 POS (A)   Hepatitis B Surface Ag (no units)  Date Value  10/18/2009 NEG   HCV Ab (no units)  Date Value  10/18/2009 NEG    CrCl: CrCl cannot be calculated (Patient's most recent lab result is older than the maximum 21 days allowed.).  Lipids:    Component Value Date/Time   CHOL 175 12/28/2015 1135   TRIG 67 12/28/2015 1135   HDL 49 12/28/2015 1135   CHOLHDL 3.6 12/28/2015 1135   VLDL 13 12/28/2015 1135   LDLCALC 113 12/28/2015 1135    Assessment: Segundo has a long history of poor compliance. As usual, he has missed quite  a few doses because he doesn't think that he needs the medication all the time. He feels like the government is against him and making the cost of medication so expensive. He asked about what is wrong with taking the meds every other day because based on his routine of taking it prior to the visit, his virus as always done well. I feel like no matter what I say, he will never be adherent to his meds. Talk to him about how dangerous it is with the way he has been taking his meds. Advised if he would consider to more adherent if we change him to Symtuza/DTG. My guess is that his adherence will be the same. He showed me an image of all the meds that he has at home. He has probably about 40-60 bottles of medication. I really wonder if we should just stop his ART because he is not taking them. Maybe the discussion can be started at the next visit with Dr. Linus Salmons.   His wife is a patient her also. He said that she is  suppressed but she has stop meds before for a month from time to time.    Recommendations:  Stop current regimen Start Symtuza/DTG daily with food HIV labs today F/u with Dr. Linus Salmons in Feb Consider stopping his ART   Onnie Boer, PharmD, BCPS, AAHIVP, CPP Clinical Infectious [facility] 05/21/2017, 1:29 PM

## 2017-05-22 LAB — BASIC METABOLIC PANEL
BUN: 16 mg/dL (ref 7–25)
CO2: 26 mmol/L (ref 20–32)
Calcium: 9 mg/dL (ref 8.6–10.3)
Chloride: 104 mmol/L (ref 98–110)
Creat: 1.29 mg/dL (ref 0.70–1.33)
Glucose, Bld: 113 mg/dL — ABNORMAL HIGH (ref 65–99)
Potassium: 4.5 mmol/L (ref 3.5–5.3)
Sodium: 139 mmol/L (ref 135–146)

## 2017-05-22 LAB — T-HELPER CELL (CD4) - (RCID CLINIC ONLY)
CD4 % Helper T Cell: 19 % — ABNORMAL LOW (ref 33–55)
CD4 T Cell Abs: 820 /uL (ref 400–2700)

## 2017-05-22 LAB — URINE CYTOLOGY ANCILLARY ONLY
Chlamydia: NEGATIVE
Neisseria Gonorrhea: NEGATIVE

## 2017-05-22 LAB — RPR: RPR Ser Ql: NONREACTIVE

## 2017-05-23 DIAGNOSIS — E291 Testicular hypofunction: Secondary | ICD-10-CM | POA: Diagnosis not present

## 2017-05-26 ENCOUNTER — Ambulatory Visit (INDEPENDENT_AMBULATORY_CARE_PROVIDER_SITE_OTHER): Payer: Medicare HMO | Admitting: Orthopaedic Surgery

## 2017-05-27 LAB — HIV RNA, RTPCR W/R GT (RTI, PI,INT)
HIV 1 RNA Quant: <20 copies/mL
HIV-1 RNA Quant, Log: <1.3 Log copies/mL

## 2017-05-28 ENCOUNTER — Other Ambulatory Visit: Payer: Self-pay | Admitting: Podiatry

## 2017-06-06 ENCOUNTER — Other Ambulatory Visit: Payer: Self-pay | Admitting: Pharmacist

## 2017-06-09 ENCOUNTER — Ambulatory Visit (INDEPENDENT_AMBULATORY_CARE_PROVIDER_SITE_OTHER): Payer: Medicare HMO | Admitting: Orthopaedic Surgery

## 2017-06-11 MED FILL — VALACYCLOVIR HCL 500 MG TAB: 500 | 30 days supply | Qty: 60 | Fill #1

## 2017-06-11 MED FILL — TIVICAY 50 MG TABLET: 50 | 30 days supply | Qty: 30 | Fill #0

## 2017-06-13 MED FILL — SYMTUZA 800-150-200-10 MG T: 800-150-200 | 30 days supply | Qty: 30 | Fill #1

## 2017-06-23 DIAGNOSIS — E291 Testicular hypofunction: Secondary | ICD-10-CM | POA: Diagnosis not present

## 2017-06-30 ENCOUNTER — Ambulatory Visit (INDEPENDENT_AMBULATORY_CARE_PROVIDER_SITE_OTHER): Payer: Medicare HMO | Admitting: Internal Medicine

## 2017-06-30 VITALS — BP 168/98 | HR 89 | Temp 98.2°F | Wt 392.0 lb

## 2017-06-30 DIAGNOSIS — Z113 Encounter for screening for infections with a predominantly sexual mode of transmission: Secondary | ICD-10-CM | POA: Diagnosis not present

## 2017-06-30 DIAGNOSIS — J45909 Unspecified asthma, uncomplicated: Secondary | ICD-10-CM

## 2017-06-30 DIAGNOSIS — B20 Human immunodeficiency virus [HIV] disease: Secondary | ICD-10-CM | POA: Diagnosis not present

## 2017-06-30 DIAGNOSIS — R768 Other specified abnormal immunological findings in serum: Secondary | ICD-10-CM | POA: Diagnosis not present

## 2017-06-30 DIAGNOSIS — Z68.41 Body mass index (BMI) pediatric, greater than or equal to 95th percentile for age: Secondary | ICD-10-CM

## 2017-06-30 DIAGNOSIS — E6609 Other obesity due to excess calories: Secondary | ICD-10-CM

## 2017-06-30 DIAGNOSIS — R69 Illness, unspecified: Secondary | ICD-10-CM | POA: Diagnosis not present

## 2017-06-30 MED ORDER — BECLOMETHASONE DIPROP MONOHYD 42 MCG/SPRAY NA SUSP: 1.0000 | Freq: Two times a day (BID) | NASAL | 12 refills | Status: DC

## 2017-07-01 ENCOUNTER — Encounter: Payer: Self-pay | Admitting: Internal Medicine

## 2017-07-01 NOTE — Assessment & Plan Note (Signed)
Screened negative 

## 2017-07-01 NOTE — Progress Notes (Signed)
   Subjective:    Patient ID: Vincent Young, male    DOB: Mar 18, 1966, 52 y.o.   MRN: [id number]  HPI Here for follow up of HIV He is on [place] which was changed last visit to help streamline his regimen.  He has a long history of poor compliance and has felt that taking sporadically accomplishes the same suppression as daily medication.  He has felt that it is only daily to continue to charge high prices and not actually necessary.  He has tested this theory by stopping his medication and remaining suppressed on labs.  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  No associated n/v/d with medication.   Otherwise he is doing well though gained weight back.     Review of Systems  Constitutional: Negative for fatigue.  Gastrointestinal: Negative for diarrhea and nausea.  Skin: Negative for rash.  Neurological: Negative for dizziness.       Objective:   Physical Exam  Constitutional: He appears well-developed and well-nourished. No distress.  HENT:  Mouth/Throat: No oropharyngeal exudate.  Eyes: No scleral icterus.  Cardiovascular: Normal rate, regular rhythm and normal heart sounds.  No murmur heard. Pulmonary/Chest: Effort normal and breath sounds normal. No respiratory distress.  Lymphadenopathy:    He has no cervical adenopathy.  Skin: No rash noted.   SH: no current tobacco       Assessment & Plan:

## 2017-07-01 NOTE — Assessment & Plan Note (Signed)
On valtrex suppression. No outbreaks.

## 2017-07-01 NOTE — Assessment & Plan Note (Signed)
I had a long discussion with the patient in regards to his compliance and need to take daily, despite his perceived experience taking it sporadically.  I did explain that he can develop more resistance which will make it more difficult to treat in the future and with his history of AIDS with low CD4, he clearly is not a  Long term non-progressor so needs to stay on medication.  He has decided to take daily and remain suppressed.  rtc 4 months

## 2017-07-01 NOTE — Assessment & Plan Note (Signed)
Encouraged continued efforts at weight loss 

## 2017-07-20 ENCOUNTER — Emergency Department (HOSPITAL_COMMUNITY)
Admission: EM | Admit: 2017-07-20 | Discharge: 2017-07-20 | Disposition: A | Discharge status: Home / Self Care | Payer: Medicare HMO | Attending: Emergency Medicine | Admitting: Emergency Medicine

## 2017-07-20 ENCOUNTER — Encounter (HOSPITAL_COMMUNITY): Payer: Self-pay | Admitting: Emergency Medicine

## 2017-07-20 ENCOUNTER — Other Ambulatory Visit: Payer: Self-pay

## 2017-07-20 DIAGNOSIS — E119 Type 2 diabetes mellitus without complications: Secondary | ICD-10-CM | POA: Insufficient documentation

## 2017-07-20 DIAGNOSIS — Z87891 Personal history of nicotine dependence: Secondary | ICD-10-CM | POA: Insufficient documentation

## 2017-07-20 DIAGNOSIS — R197 Diarrhea, unspecified: Secondary | ICD-10-CM | POA: Diagnosis present

## 2017-07-20 DIAGNOSIS — R112 Nausea with vomiting, unspecified: Secondary | ICD-10-CM | POA: Diagnosis not present

## 2017-07-20 DIAGNOSIS — K529 Noninfective gastroenteritis and colitis, unspecified: Secondary | ICD-10-CM | POA: Diagnosis not present

## 2017-07-20 DIAGNOSIS — I10 Essential (primary) hypertension: Secondary | ICD-10-CM | POA: Diagnosis not present

## 2017-07-20 DIAGNOSIS — J45909 Unspecified asthma, uncomplicated: Secondary | ICD-10-CM | POA: Diagnosis not present

## 2017-07-20 DIAGNOSIS — Z79899 Other long term (current) drug therapy: Secondary | ICD-10-CM | POA: Insufficient documentation

## 2017-07-20 LAB — CBC
HCT: 46.3 % (ref 39.0–52.0)
Hemoglobin: 15.7 g/dL (ref 13.0–17.0)
MCH: 32 pg (ref 26.0–34.0)
MCHC: 33.9 g/dL (ref 30.0–36.0)
MCV: 94.3 fL (ref 78.0–100.0)
Platelets: 263 10*3/uL (ref 150–400)
RBC: 4.91 MIL/uL (ref 4.22–5.81)
RDW: 14.7 % (ref 11.5–15.5)
WBC: 8 10*3/uL (ref 4.0–10.5)

## 2017-07-20 LAB — COMPREHENSIVE METABOLIC PANEL
ALT: 16 U/L — ABNORMAL LOW (ref 17–63)
AST: 18 U/L (ref 15–41)
Albumin: 3.5 g/dL (ref 3.5–5.0)
Alkaline Phosphatase: 84 U/L (ref 38–126)
Anion gap: 10 (ref 5–15)
BUN: 14 mg/dL (ref 6–20)
CO2: 23 mmol/L (ref 22–32)
Calcium: 8.9 mg/dL (ref 8.9–10.3)
Chloride: 104 mmol/L (ref 101–111)
Creatinine, Ser: 1.22 mg/dL (ref 0.61–1.24)
GFR calc Af Amer: >60 mL/min (ref >60)
GFR calc non Af Amer: >60 mL/min (ref >60)
Glucose, Bld: 122 mg/dL — ABNORMAL HIGH (ref 65–99)
Potassium: 4.1 mmol/L (ref 3.5–5.1)
Sodium: 137 mmol/L (ref 135–145)
Total Bilirubin: 0.6 mg/dL (ref 0.3–1.2)
Total Protein: 7.9 g/dL (ref 6.5–8.1)

## 2017-07-20 LAB — URINALYSIS, ROUTINE W REFLEX MICROSCOPIC
Bacteria, UA: NONE SEEN
Bilirubin Urine: NEGATIVE
Glucose, UA: NEGATIVE mg/dL
Hgb urine dipstick: NEGATIVE
Ketones, ur: NEGATIVE mg/dL
Leukocytes, UA: NEGATIVE
Nitrite: NEGATIVE
Protein, ur: 30 mg/dL — AB
Specific Gravity, Urine: 1.023 (ref 1.005–1.030)
pH: 5 (ref 5.0–8.0)

## 2017-07-20 LAB — LIPASE, BLOOD: Lipase: 24 U/L (ref 11–51)

## 2017-07-20 MED ORDER — CIPROFLOXACIN HCL 500 MG PO TABS: 500.0000 mg | ORAL_TABLET | Freq: Two times a day (BID) | ORAL | 0 refills | Status: DC

## 2017-07-20 MED ORDER — ONDANSETRON 8 MG PO TBDP: 8.0000 mg | ORAL_TABLET | Freq: Three times a day (TID) | ORAL | 0 refills | Status: DC | PRN

## 2017-07-20 MED ORDER — GI COCKTAIL ~~LOC~~
30.0000 mL | Freq: Once | ORAL | Status: AC
  Administered 2017-07-20: 30 mL via ORAL
  Filled 2017-07-20: qty 30

## 2017-07-20 MED ORDER — ONDANSETRON 4 MG PO TBDP
8.0000 mg | ORAL_TABLET | Freq: Once | ORAL | Status: AC
  Administered 2017-07-20: 8 mg via ORAL
  Filled 2017-07-20: qty 2

## 2017-07-20 MED ORDER — TRAMADOL HCL 50 MG PO TABS
50.0000 mg | ORAL_TABLET | Freq: Once | ORAL | Status: AC
  Administered 2017-07-20: 50 mg via ORAL
  Filled 2017-07-20: qty 1

## 2017-07-20 MED ORDER — FAMOTIDINE 20 MG PO TABS
20.0000 mg | ORAL_TABLET | Freq: Once | ORAL | Status: AC
Start: 2017-07-20 — End: 2017-07-20
  Administered 2017-07-20: 20 mg via ORAL
  Filled 2017-07-20: qty 1

## 2017-07-20 MED ORDER — CIPROFLOXACIN HCL 500 MG PO TABS
500.0000 mg | ORAL_TABLET | Freq: Once | ORAL | Status: AC
  Administered 2017-07-20: 500 mg via ORAL
  Filled 2017-07-20: qty 1

## 2017-07-20 NOTE — ED Notes (Signed)
Pt reports he had to stop drinking the ginger ale because he started to feel worse. Pt states he stomach started to feel bloated.

## 2017-07-20 NOTE — ED Notes (Signed)
Pt complains of nausea, kidney pain on the left side, decreased appetite, and diarrhea. Pt states this has been going on for a couple of days now. Unable to get relief.

## 2017-07-20 NOTE — Discharge Instructions (Signed)
It was our pleasure to provide your ER care today - we hope that you feel better.  Rest. Drink plenty of fluids.  Take zofran as need for nausea.  Take cipro (antibiotic) as prescribed.  Follow up with primary care doctor in the next 1-2 days if symptoms fail to improve/resolve.  Return to ER if worse, new symptoms, fevers, persistent vomiting, worsening or severe pain, other concern.

## 2017-07-20 NOTE — ED Notes (Signed)
Pt given diet ginger ale. Drinking same with no problems.

## 2017-07-20 NOTE — ED Provider Notes (Addendum)
[facility] EMERGENCY DEPARTMENT Provider Note   CSN: [id number] Arrival date & time: 07/20/17  1653     History   Chief Complaint Chief Complaint  Patient presents with  . Diarrhea  . Emesis    HPI Vincent Young is a 52 y.o. male.  Patient c/o nausea, vomiting and diarrhea in past 2-3 days. Initially started with diarrhea, 6-8 episodes of watery stools per day. Some mucous. Then today, a few episodes emesis. Emesis is clear, not bloody. No  abd pain or distension. No fever or chills. No travel. No recent abx. No definite known ill contacts. Denies cough, or uri symptoms. No dysuria or gu c/o. During period of waiting in ED for 3 hours, no episodes of emesis.    The history is provided by the patient.  Diarrhea   Associated symptoms include vomiting. Pertinent negatives include no abdominal pain and no headaches.  Emesis   Associated symptoms include diarrhea. Pertinent negatives include no abdominal pain, no fever and no headaches.    Past Medical History:  Diagnosis Date  . Acute renal failure (HCC)    due to dehydration  . Arthritis   . Asthma   . Diabetes mellitus without complication ([place])   . DJD (degenerative joint disease)   . Exposure to TB   . Family history of adverse reaction to anesthesia    " My brother has had PONV"  . GERD (gastroesophageal reflux disease)   . Headache   . HIV infection ([place])   . Hypertension   . Obesity   . Pilonidal cyst   . Recurrent genital HSV (herpes simplex virus) infection   . Sleep apnea    never tested-has s/s    Patient Active Problem List   Diagnosis Date Noted  . Obesity due to excess calories with serious comorbidity and body mass index (BMI) greater than 99th percentile for age in pediatric patient 11/21/2016  . 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 Vaccine counseling 11/21/2016  . Screening examination for venereal disease 05/31/2014  . Encounter for long-term (current) use of medications  05/31/2014  . Bronchitis, allergic 10/19/2013  . Healthcare maintenance 02/02/2013  . OBESITY, MORBID 11/02/2009  . PERIPHERAL NEUROPATHY 11/02/2009  . Herpes simplex virus (HSV) infection 11/01/2009  . TONSILLECTOMY, HX OF 11/01/2009  . Human immunodeficiency virus (HIV) disease ([place]) 10/12/2009    Past Surgical History:  Procedure Laterality Date  . APPENDECTOMY    . 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 COLONOSCOPY W/ BIOPSIES AND POLYPECTOMY    . COLONOSCOPY WITH PROPOFOL N/A 08/29/2014   Procedure: COLONOSCOPY WITH PROPOFOL;  Surgeon: Clarene Essex, MD;  Location: [facility] ENDOSCOPY;  Service: Endoscopy;  Laterality: N/A;  . HERNIA REPAIR     inguinal as a child  . PEG TUBE PLACEMENT  2009  . TONSILLECTOMY    . UVULECTOMY  2009       Home Medications    Prior to Admission medications   Medication Sig Start Date End Date Taking? Authorizing Provider  albuterol (PROVENTIL HFA;VENTOLIN HFA) 108 (90 BASE) MCG/ACT inhaler Inhale 1-2 puffs into the lungs every 6 (six) hours as needed for wheezing or shortness of breath. Patient not taking: Reported on 06/30/2017 05/15/14   Ezequiel Essex, MD  beclomethasone [facility]) [phone]  MCG/SPRAY nasal spray Place 1 spray into both nostrils 2 (two) times daily. Dose is for each nostril. 06/30/17   Thayer Headings, MD  Darunavir-Cobicisctat-Emtricitabine-Tenofovir Alafenamide [facility]) 800-150-200-10 MG TABS Take 1 tablet by mouth daily with breakfast. 05/21/17   Comer, Okey Regal, MD  diphenhydrAMINE (BENADRYL) 25 MG tablet Take 2 tablets (50 mg total) by mouth every 8 (eight) hours as needed (allergic reaction). 04/03/14   Jola Schmidt, MD  dolutegravir (TIVICAY) 50 MG tablet Take 1  tablet (50 mg total) by mouth daily. 05/21/17   Comer, Okey Regal, MD  ibuprofen (ADVIL,MOTRIN) 800 MG tablet Take 1 tablet (800 mg total) by mouth 3 (three) times daily. 08/14/15   Mackuen, Courteney Lyn, MD  ketoconazole (NIZORAL) 2 % cream APPLY 1 FINGERTIP AMOUNT TO EACH FOOT DAILY. 05/29/17   Evelina Bucy, DPM  valACYclovir (VALTREX) 500 MG tablet Take 2 tablets (1,000 mg total) by mouth daily. 05/14/17   Thayer Headings, MD    Family History Family History  Problem Relation Age of Onset  . Heart disease Father   . Hypertension Father   . Hypertension Mother   . Arthritis Mother   . Hypertension Sister   . Arthritis Sister   . Hypertension Brother     Social History Social History   Tobacco Use  . Smoking status: Former Smoker    Last attempt to quit: 12/04/2003    Years since quitting: 13.6  . Smokeless tobacco: Never Used  . Tobacco comment: pt. no longer smokes  Substance Use Topics  . Alcohol use: Yes    Alcohol/week: 0.0 oz    Comment: occasional  . Drug use: Yes    Types: Marijuana    Comment: occasional     Allergies   Shellfish allergy; Other; and Penicillins   Review of Systems Review of Systems  Constitutional: Negative for fever.  HENT: Negative for sore throat.   Eyes: Negative for redness.  Respiratory: Negative for shortness of breath.   Cardiovascular: Negative for chest pain.  Gastrointestinal: Positive for diarrhea and vomiting. Negative for abdominal pain.  Genitourinary: Negative for dysuria and flank pain.  Musculoskeletal: Negative for back pain.  Skin: Negative for rash.  Neurological: Negative for headaches.  Hematological: Does not bruise/bleed easily.  Psychiatric/Behavioral: Negative for confusion.     Physical Exam Updated Vital Signs BP (!) 141/87 (BP Location: Left Arm)   Pulse 99   Temp 99.3 F (37.4 C) (Oral)   Resp 18   SpO2 96%   Physical Exam  Constitutional: He appears well-developed and well-nourished. No  distress.  HENT:  Mouth/Throat: Oropharynx is clear and moist.  Eyes: Conjunctivae are normal.  Neck: Neck supple. No tracheal deviation present.  Cardiovascular: Normal rate, regular rhythm, normal heart sounds and intact distal pulses.  Pulmonary/Chest: Effort normal and breath sounds normal. No accessory muscle usage. No respiratory distress.  Abdominal: Soft. Bowel sounds are normal. He exhibits no distension. There is no tenderness.  Genitourinary:  Genitourinary Comments: No cva tenderness  Musculoskeletal: He exhibits no edema.  Neurological: He is alert.  Skin: Skin is warm and dry. He is not diaphoretic.  Psychiatric: He has a normal mood and affect.  Nursing note and vitals reviewed.    ED Treatments / Results  Labs (all labs ordered are listed, but only abnormal results are displayed) Results for orders placed or performed during the hospital encounter of 07/20/17  Lipase, blood  Result Value Ref Range   Lipase 24 11 - 51  U/L  Comprehensive metabolic panel  Result Value Ref Range   Sodium 137 135 - 145 mmol/L   Potassium 4.1 3.5 - 5.1 mmol/L   Chloride 104 101 - 111 mmol/L   CO2 23 22 - 32 mmol/L   Glucose, Bld 122 (H) 65 - 99 mg/dL   BUN 14 6 - 20 mg/dL   Creatinine, Ser 1.22 0.61 - 1.24 mg/dL   Calcium 8.9 8.9 - 10.3 mg/dL   Total Protein 7.9 6.5 - 8.1 g/dL   Albumin 3.5 3.5 - 5.0 g/dL   AST 18 15 - 41 U/L   ALT 16 (L) 17 - 63 U/L   Alkaline Phosphatase 84 38 - 126 U/L   Total Bilirubin 0.6 0.3 - 1.2 mg/dL   GFR calc non Af Amer >60 >60 mL/min   GFR calc Af Amer >60 >60 mL/min   Anion gap 10 5 - 15  CBC  Result Value Ref Range   WBC 8.0 4.0 - 10.5 K/uL   RBC 4.91 4.22 - 5.81 MIL/uL   Hemoglobin 15.7 13.0 - 17.0 g/dL   HCT 46.3 39.0 - 52.0 %   MCV 94.3 78.0 - 100.0 fL   MCH 32.0 26.0 - 34.0 pg   MCHC 33.9 30.0 - 36.0 g/dL   RDW 14.7 11.5 - 15.5 %   Platelets 263 150 - 400 K/uL  Urinalysis, Routine w reflex microscopic  Result Value Ref Range    Color, Urine YELLOW YELLOW   APPearance CLEAR CLEAR   Specific Gravity, Urine 1.023 1.005 - 1.030   pH 5.0 5.0 - 8.0   Glucose, UA NEGATIVE NEGATIVE mg/dL   Hgb urine dipstick NEGATIVE NEGATIVE   Bilirubin Urine NEGATIVE NEGATIVE   Ketones, ur NEGATIVE NEGATIVE mg/dL   Protein, ur 30 (A) NEGATIVE mg/dL   Nitrite NEGATIVE NEGATIVE   Leukocytes, UA NEGATIVE NEGATIVE   RBC / HPF 0-5 0 - 5 RBC/hpf   WBC, UA 0-5 0 - 5 WBC/hpf   Bacteria, UA NONE SEEN NONE SEEN   Squamous Epithelial / LPF 0-5 (A) NONE SEEN   Mucus PRESENT     EKG  EKG Interpretation None       Radiology No results found.  Procedures Procedures (including critical care time)  Medications Ordered in ED Medications  ondansetron (ZOFRAN-ODT) disintegrating tablet 8 mg (not administered)     Initial Impression / Assessment and Plan / ED Course  I have reviewed the triage vital signs and the nursing notes.  Pertinent labs & imaging results that were available during my care of the patient were reviewed by me and considered in my medical decision making (see chart for details).  zofran po.  Po fluids.  abd is soft nt.  Reviewed nursing notes and prior charts for additional history.    As already 3 days diarrhea, mucousy/streaks brb, will tx with abx.   Tolerating po fluids. Labs unremarkable.  pepcid and gi cocktail po.  abd is soft nt.   Pt is tolerating po.   Patient currently appears stable for d/c.     Final Clinical Impressions(s) / ED Diagnoses   Final diagnoses:  None    ED Discharge Orders    None           Lajean Saver, MD 07/20/17 2113

## 2017-07-20 NOTE — ED Triage Notes (Signed)
Patient to ED for assessment of diarrhea x 3 days, with now emesis x 2 times today, congestion, blood on tissue with wiping, mucousy stool.  Patient states he woke up this morning covered in sweat, but unsure if he had fevers.

## 2017-07-22 DIAGNOSIS — K625 Hemorrhage of anus and rectum: Secondary | ICD-10-CM | POA: Diagnosis not present

## 2017-07-22 DIAGNOSIS — K640 First degree hemorrhoids: Secondary | ICD-10-CM | POA: Diagnosis not present

## 2017-07-22 DIAGNOSIS — K529 Noninfective gastroenteritis and colitis, unspecified: Secondary | ICD-10-CM | POA: Diagnosis not present

## 2017-07-25 MED FILL — TIVICAY 50 MG TABLET: 50 | 30 days supply | Qty: 30 | Fill #1

## 2017-07-25 MED FILL — VALACYCLOVIR HCL 500 MG TAB: 500 | 30 days supply | Qty: 60 | Fill #2

## 2017-07-25 MED FILL — SYMTUZA 800-150-200-10 MG T: 800-150-200 | 30 days supply | Qty: 30 | Fill #2

## 2017-08-19 MED FILL — TIVICAY 50 MG TABLET: 50 | 30 days supply | Qty: 30 | Fill #2

## 2017-08-19 MED FILL — SYMTUZA 800-150-200-10 MG T: 800-150-200 | 30 days supply | Qty: 30 | Fill #3

## 2017-09-10 ENCOUNTER — Other Ambulatory Visit: Payer: Self-pay | Admitting: Internal Medicine

## 2017-09-10 DIAGNOSIS — B20 Human immunodeficiency virus [HIV] disease: Secondary | ICD-10-CM

## 2017-09-15 ENCOUNTER — Ambulatory Visit: Payer: Medicare HMO

## 2017-09-15 ENCOUNTER — Ambulatory Visit (INDEPENDENT_AMBULATORY_CARE_PROVIDER_SITE_OTHER): Payer: Medicare HMO | Admitting: Pharmacist

## 2017-09-15 DIAGNOSIS — B20 Human immunodeficiency virus [HIV] disease: Secondary | ICD-10-CM

## 2017-09-15 NOTE — Progress Notes (Signed)
  Patient came in to give paperwork to [facility] for co-pay assistance.  Claims to be taking his medications daily. Will see Comer in July.

## 2017-09-19 ENCOUNTER — Encounter (HOSPITAL_COMMUNITY): Payer: Self-pay | Admitting: Emergency Medicine

## 2017-09-19 ENCOUNTER — Other Ambulatory Visit: Payer: Self-pay

## 2017-09-19 ENCOUNTER — Emergency Department (HOSPITAL_COMMUNITY)
Admission: EM | Admit: 2017-09-19 | Discharge: 2017-09-19 | Disposition: A | Discharge status: Home / Self Care | Payer: Medicare HMO | Attending: Emergency Medicine | Admitting: Emergency Medicine

## 2017-09-19 DIAGNOSIS — M109 Gout, unspecified: Secondary | ICD-10-CM | POA: Insufficient documentation

## 2017-09-19 DIAGNOSIS — I10 Essential (primary) hypertension: Secondary | ICD-10-CM | POA: Diagnosis not present

## 2017-09-19 DIAGNOSIS — E119 Type 2 diabetes mellitus without complications: Secondary | ICD-10-CM | POA: Insufficient documentation

## 2017-09-19 DIAGNOSIS — Z79899 Other long term (current) drug therapy: Secondary | ICD-10-CM | POA: Insufficient documentation

## 2017-09-19 DIAGNOSIS — M10472 Other secondary gout, left ankle and foot: Secondary | ICD-10-CM

## 2017-09-19 DIAGNOSIS — Z87891 Personal history of nicotine dependence: Secondary | ICD-10-CM | POA: Insufficient documentation

## 2017-09-19 DIAGNOSIS — M79675 Pain in left toe(s): Secondary | ICD-10-CM | POA: Diagnosis present

## 2017-09-19 DIAGNOSIS — J45909 Unspecified asthma, uncomplicated: Secondary | ICD-10-CM | POA: Insufficient documentation

## 2017-09-19 MED ORDER — OXYCODONE-ACETAMINOPHEN 5-325 MG PO TABS: 1.0000 | ORAL_TABLET | Freq: Four times a day (QID) | ORAL | 0 refills | Status: DC | PRN

## 2017-09-19 MED ORDER — INDOMETHACIN 25 MG PO CAPS: 25.0000 mg | ORAL_CAPSULE | Freq: Three times a day (TID) | ORAL | 0 refills | Status: DC | PRN

## 2017-09-19 MED ORDER — OXYCODONE-ACETAMINOPHEN 5-325 MG PO TABS
1.0000 | ORAL_TABLET | Freq: Once | ORAL | Status: AC
  Administered 2017-09-19: 1 via ORAL
  Filled 2017-09-19: qty 1

## 2017-09-19 NOTE — Discharge Instructions (Addendum)
Return here as needed.  Follow-up with your primary doctor for recheck.  Ice and elevate your toe.

## 2017-09-19 NOTE — ED Triage Notes (Signed)
Pt reports L foot pain/swelling X1 day. Denies injury. Pt is concerned b/c hx of diabetes.

## 2017-09-19 NOTE — ED Notes (Signed)
Patient given discharge instructions and verbalized understanding.  Patient stable to discharge at this time.  Patient is alert and oriented to baseline.  No distressed noted at this time.  All belongings taken with the patient at discharge.   

## 2017-09-19 NOTE — ED Provider Notes (Signed)
[facility] EMERGENCY DEPARTMENT Provider Note   CSN: [id number] Arrival date & time: 09/19/17  0559     History   Chief Complaint Chief Complaint  Patient presents with  . Foot Pain    HPI Vincent Young is a 52 y.o. male.  HPI Patient presents to the emergency department with pain in his left great toe that started 1 day ago.  The patient states that nothing seems make the condition better but certain movements and palpation make the pain worse.  Patient states he has had this 1 other time and took ibuprofen at home and the symptoms resolved.  Patient states he took ibuprofen and did not get any relief.  The patient denies chest pain, shortness of breath, headache,blurred vision, neck pain, fever, cough, weakness, numbness, dizziness, anorexia, edema, abdominal pain, nausea, vomiting, diarrhea, rash, back pain, dysuria, hematemesis, bloody stool, near syncope, or syncope. Past Medical History:  Diagnosis Date  . Acute renal failure (HCC)    due to dehydration  . Arthritis   . Asthma   . Diabetes mellitus without complication ([place])   . DJD (degenerative joint disease)   . Exposure to TB   . Family history of adverse reaction to anesthesia    " My brother has had PONV"  . GERD (gastroesophageal reflux disease)   . Headache   . HIV infection ([place])   . Hypertension   . Obesity   . Pilonidal cyst   . Recurrent genital HSV (herpes simplex virus) infection   . Sleep apnea    never tested-has s/s    Patient Active Problem List   Diagnosis Date Noted  . Obesity due to excess calories with serious comorbidity and body mass index (BMI) greater than 99th percentile for age in pediatric patient 11/21/2016  . 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 Vaccine counseling 11/21/2016  . Screening examination for venereal disease 05/31/2014  . Encounter for long-term (current) use of medications 05/31/2014  . Bronchitis, allergic 10/19/2013  . Healthcare maintenance  02/02/2013  . OBESITY, MORBID 11/02/2009  . PERIPHERAL NEUROPATHY 11/02/2009  . Herpes simplex virus (HSV) infection 11/01/2009  . TONSILLECTOMY, HX OF 11/01/2009  . Human immunodeficiency virus (HIV) disease ([place]) 10/12/2009    Past Surgical History:  Procedure Laterality Date  . APPENDECTOMY    . 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 COLONOSCOPY W/ BIOPSIES AND POLYPECTOMY    . COLONOSCOPY WITH PROPOFOL N/A 08/29/2014   Procedure: COLONOSCOPY WITH PROPOFOL;  Surgeon: Clarene Essex, MD;  Location: [facility] ENDOSCOPY;  Service: Endoscopy;  Laterality: N/A;  . HERNIA REPAIR     inguinal as a child  . PEG TUBE PLACEMENT  2009  . TONSILLECTOMY    . UVULECTOMY  2009        Home Medications    Prior to Admission medications   Medication Sig Start Date End Date Taking? Authorizing Provider  albuterol (PROVENTIL HFA;VENTOLIN HFA) 108 (90 BASE) MCG/ACT inhaler Inhale 1-2 puffs into the lungs every 6 (six) hours as needed for wheezing or shortness of breath. Patient not taking: Reported on 06/30/2017 05/15/14   Ezequiel Essex, MD  beclomethasone (BECONASE-AQ) 42 MCG/SPRAY nasal spray Place 1 spray into both nostrils 2 (two) times daily. Dose is for each nostril. 06/30/17  Comer, Okey Regal, MD  ciprofloxacin (CIPRO) 500 MG tablet Take 1 tablet (500 mg total) by mouth 2 (two) times daily. 07/20/17   Lajean Saver, MD  Darunavir-Cobicisctat-Emtricitabine-Tenofovir Alafenamide [facility]) 800-150-200-10 MG TABS Take 1 tablet by mouth daily with breakfast. 05/21/17   Comer, Okey Regal, MD  diphenhydrAMINE (BENADRYL) 25 MG tablet Take 2 tablets (50 mg total) by mouth every 8 (eight) hours as needed (allergic reaction). 04/03/14   Jola Schmidt, MD   dolutegravir (TIVICAY) 50 MG tablet Take 1 tablet (50 mg total) by mouth daily. 05/21/17   Comer, Okey Regal, MD  ibuprofen (ADVIL,MOTRIN) 800 MG tablet Take 1 tablet (800 mg total) by mouth 3 (three) times daily. 08/14/15   Mackuen, Courteney Lyn, MD  ketoconazole (NIZORAL) 2 % cream APPLY 1 FINGERTIP AMOUNT TO EACH FOOT DAILY. 05/29/17   Evelina Bucy, DPM  ondansetron (ZOFRAN ODT) 8 MG disintegrating tablet Take 1 tablet (8 mg total) by mouth every 8 (eight) hours as needed for nausea or vomiting. 07/20/17   Lajean Saver, MD  valACYclovir (VALTREX) 500 MG tablet TAKE 2 TABLETS (1,000 MG TOTAL) BY MOUTH DAILY. 09/10/17   Thayer Headings, MD    Family History Family History  Problem Relation Age of Onset  . Heart disease Father   . Hypertension Father   . Hypertension Mother   . Arthritis Mother   . Hypertension Sister   . Arthritis Sister   . Hypertension Brother     Social History Social History   Tobacco Use  . Smoking status: Former Smoker    Last attempt to quit: 12/04/2003    Years since quitting: 13.8  . Smokeless tobacco: Never Used  . Tobacco comment: pt. no longer smokes  Substance Use Topics  . Alcohol use: Yes    Alcohol/week: 0.0 oz    Comment: occasional  . Drug use: Yes    Types: Marijuana    Comment: occasional     Allergies   Shellfish allergy; Other; and Penicillins   Review of Systems Review of Systems All other systems negative except as documented in the HPI. All pertinent positives and negatives as reviewed in the HPI.  Physical Exam Updated Vital Signs BP 138/90   Pulse 91   Temp 98.9 F (37.2 C) (Oral)   Resp 18   Ht 6\' 1"  (1.854 m)   Wt (!) 170.1 kg (375 lb)   SpO2 97%   BMI 49.48 kg/m   Physical Exam  Constitutional: He is oriented to person, place, and time. He appears well-developed and well-nourished. No distress.  HENT:  Head: Normocephalic and atraumatic.  Eyes: Pupils are equal, round, and reactive to light.    Pulmonary/Chest: Effort normal.  Musculoskeletal:       Feet:  Neurological: He is alert and oriented to person, place, and time.  Skin: Skin is warm and dry.  Psychiatric: He has a normal mood and affect.  Nursing note and vitals reviewed.    ED Treatments / Results  Labs (all labs ordered are listed, but only abnormal results are displayed) Labs Reviewed - No data to display  EKG None  Radiology No results found.  Procedures Procedures (including critical care time)  Medications Ordered in ED Medications - No data to display   Initial Impression / Assessment and Plan / ED Course  I have reviewed the triage vital signs and the nursing notes.  Pertinent labs & imaging results that were available during my care of the patient were reviewed by  me and considered in my medical decision making (see chart for details).    I feel that the patient has a gout exacerbation on his physical exam findings.  The area is isolated to the base of the left great toe.  Patient is advised to follow-up with his primary doctor told to return here as needed.  I have also advised him to ice and elevate the toe.  Final Clinical Impressions(s) / ED Diagnoses   Final diagnoses:  None    ED Discharge Orders    None       Dalia Heading, PA-C 09/19/17 [place], MD 09/19/17 2111

## 2017-09-25 MED FILL — SYMTUZA 800-150-200-10 MG T: 800-150-200 | 30 days supply | Qty: 30 | Fill #4

## 2017-09-25 MED FILL — VALACYCLOVIR HCL 500 MG TAB: 500 | 30 days supply | Qty: 60 | Fill #0

## 2017-09-25 MED FILL — TIVICAY 50 MG TABLET: 50 | 30 days supply | Qty: 30 | Fill #3

## 2017-10-20 MED FILL — SYMTUZA 800-150-200-10 MG T: 800-150-200 | 30 days supply | Qty: 30 | Fill #5

## 2017-10-20 MED FILL — VALACYCLOVIR HCL 500 MG TAB: 500 | 30 days supply | Qty: 60 | Fill #1

## 2017-10-20 MED FILL — TIVICAY 50 MG TABLET: 50 | 30 days supply | Qty: 30 | Fill #4

## 2017-10-27 DIAGNOSIS — E291 Testicular hypofunction: Secondary | ICD-10-CM | POA: Diagnosis not present

## 2017-11-11 DIAGNOSIS — H524 Presbyopia: Secondary | ICD-10-CM | POA: Diagnosis not present

## 2017-11-12 DIAGNOSIS — E291 Testicular hypofunction: Secondary | ICD-10-CM | POA: Diagnosis not present

## 2017-11-24 ENCOUNTER — Other Ambulatory Visit: Payer: Self-pay | Admitting: Internal Medicine

## 2017-11-25 ENCOUNTER — Other Ambulatory Visit: Payer: Self-pay | Admitting: Pharmacist

## 2017-11-25 DIAGNOSIS — B20 Human immunodeficiency virus [HIV] disease: Secondary | ICD-10-CM

## 2017-11-25 MED ORDER — DARUN-COBIC-EMTRICIT-TENOFAF 800-150-200-10 MG PO TABS: 1.0000 | ORAL_TABLET | Freq: Every day | ORAL | 1 refills | Status: DC

## 2017-11-25 MED FILL — SYMTUZA 800-150-200-10 MG T: 800-150-200 | 30 days supply | Qty: 30 | Fill #0

## 2017-11-25 MED FILL — VALACYCLOVIR HCL 500 MG TAB: 500 | 30 days supply | Qty: 60 | Fill #2

## 2017-11-25 MED FILL — TIVICAY 50 MG TABLET: 50 | 30 days supply | Qty: 30 | Fill #5

## 2017-11-26 ENCOUNTER — Ambulatory Visit: Payer: Medicare HMO | Admitting: Internal Medicine

## 2017-12-15 DIAGNOSIS — E291 Testicular hypofunction: Secondary | ICD-10-CM | POA: Diagnosis not present

## 2017-12-31 DIAGNOSIS — E291 Testicular hypofunction: Secondary | ICD-10-CM | POA: Diagnosis not present

## 2018-01-05 ENCOUNTER — Other Ambulatory Visit: Payer: Self-pay | Admitting: Internal Medicine

## 2018-01-05 MED FILL — SYMTUZA 800-150-200-10 MG T: 800-150-200 | 30 days supply | Qty: 30 | Fill #1

## 2018-01-06 ENCOUNTER — Telehealth: Payer: Self-pay

## 2018-01-06 ENCOUNTER — Other Ambulatory Visit: Payer: Self-pay

## 2018-01-06 DIAGNOSIS — B20 Human immunodeficiency virus [HIV] disease: Secondary | ICD-10-CM

## 2018-01-06 MED ORDER — DOLUTEGRAVIR SODIUM 50 MG PO TABS: 50.0000 mg | ORAL_TABLET | Freq: Every day | ORAL | 0 refills | Status: DC

## 2018-01-06 MED ORDER — DARUN-COBIC-EMTRICIT-TENOFAF 800-150-200-10 MG PO TABS: 1.0000 | ORAL_TABLET | Freq: Every day | ORAL | 0 refills | Status: DC

## 2018-01-06 MED FILL — TIVICAY 50 MG TABLET: 50 | 30 days supply | Qty: 30 | Fill #0

## 2018-01-06 NOTE — Telephone Encounter (Signed)
Patient called to verify if he was out of [place]. Patient stated that he had ran out and has missed approximately two doses and was currently only taking his Symtuza. Patient advised to take both medications  Together as prescribed. Patient verbalized understanding. Patients medication was refilled and follow up appointment scheduled with Dr. Linus Salmons on 01/21/18 and lab visit scheduled for 01/08/18. Patient was appreciative of call. Encouraged patient to keep currently appointment for future additional refills.   Torryn Fiske S. LPN

## 2018-01-08 ENCOUNTER — Other Ambulatory Visit: Payer: Medicare HMO

## 2018-01-08 DIAGNOSIS — B20 Human immunodeficiency virus [HIV] disease: Secondary | ICD-10-CM

## 2018-01-08 DIAGNOSIS — R69 Illness, unspecified: Secondary | ICD-10-CM | POA: Diagnosis not present

## 2018-01-09 LAB — T-HELPER CELL (CD4) - (RCID CLINIC ONLY)
CD4 % Helper T Cell: 21 % — ABNORMAL LOW (ref 33–55)
CD4 T Cell Abs: 640 /uL (ref 400–2700)

## 2018-01-11 LAB — HIV-1 RNA QUANT-NO REFLEX-BLD
HIV 1 RNA Quant: <20 copies/mL
HIV-1 RNA Quant, Log: <1.3 Log copies/mL

## 2018-01-13 DIAGNOSIS — I10 Essential (primary) hypertension: Secondary | ICD-10-CM | POA: Diagnosis not present

## 2018-01-13 DIAGNOSIS — E291 Testicular hypofunction: Secondary | ICD-10-CM | POA: Diagnosis not present

## 2018-01-13 DIAGNOSIS — B009 Herpesviral infection, unspecified: Secondary | ICD-10-CM | POA: Diagnosis not present

## 2018-01-13 DIAGNOSIS — E119 Type 2 diabetes mellitus without complications: Secondary | ICD-10-CM | POA: Diagnosis not present

## 2018-01-13 DIAGNOSIS — N529 Male erectile dysfunction, unspecified: Secondary | ICD-10-CM | POA: Diagnosis not present

## 2018-01-13 DIAGNOSIS — R944 Abnormal results of kidney function studies: Secondary | ICD-10-CM | POA: Diagnosis not present

## 2018-01-13 DIAGNOSIS — Z0000 Encounter for general adult medical examination without abnormal findings: Secondary | ICD-10-CM | POA: Diagnosis not present

## 2018-01-13 DIAGNOSIS — R748 Abnormal levels of other serum enzymes: Secondary | ICD-10-CM | POA: Diagnosis not present

## 2018-01-13 DIAGNOSIS — G4733 Obstructive sleep apnea (adult) (pediatric): Secondary | ICD-10-CM | POA: Diagnosis not present

## 2018-01-13 DIAGNOSIS — Z125 Encounter for screening for malignant neoplasm of prostate: Secondary | ICD-10-CM | POA: Diagnosis not present

## 2018-01-13 DIAGNOSIS — R7989 Other specified abnormal findings of blood chemistry: Secondary | ICD-10-CM | POA: Diagnosis not present

## 2018-01-21 ENCOUNTER — Encounter: Payer: Self-pay | Admitting: Internal Medicine

## 2018-01-21 ENCOUNTER — Ambulatory Visit (INDEPENDENT_AMBULATORY_CARE_PROVIDER_SITE_OTHER): Payer: Medicare HMO | Admitting: Licensed Clinical Social Worker

## 2018-01-21 ENCOUNTER — Ambulatory Visit (INDEPENDENT_AMBULATORY_CARE_PROVIDER_SITE_OTHER): Payer: Medicare HMO | Admitting: Internal Medicine

## 2018-01-21 VITALS — BP 160/115 | HR 87 | Temp 98.4°F | Ht 71.0 in | Wt 370.0 lb

## 2018-01-21 DIAGNOSIS — B20 Human immunodeficiency virus [HIV] disease: Secondary | ICD-10-CM | POA: Diagnosis not present

## 2018-01-21 DIAGNOSIS — Z23 Encounter for immunization: Secondary | ICD-10-CM | POA: Diagnosis not present

## 2018-01-21 DIAGNOSIS — R69 Illness, unspecified: Secondary | ICD-10-CM | POA: Diagnosis not present

## 2018-01-21 DIAGNOSIS — Z68.41 Body mass index (BMI) pediatric, greater than or equal to 95th percentile for age: Secondary | ICD-10-CM | POA: Diagnosis not present

## 2018-01-21 DIAGNOSIS — E6609 Other obesity due to excess calories: Secondary | ICD-10-CM

## 2018-01-21 DIAGNOSIS — Z0000 Encounter for general adult medical examination without abnormal findings: Secondary | ICD-10-CM

## 2018-01-21 DIAGNOSIS — F331 Major depressive disorder, recurrent, moderate: Secondary | ICD-10-CM | POA: Diagnosis not present

## 2018-01-21 DIAGNOSIS — Z113 Encounter for screening for infections with a predominantly sexual mode of transmission: Secondary | ICD-10-CM

## 2018-01-21 NOTE — Assessment & Plan Note (Signed)
Losing weight.  Encouraged continued weight loss

## 2018-01-21 NOTE — Progress Notes (Signed)
   Subjective:    Patient ID: Vincent Young, male    DOB: 1966-03-27, 52 y.o.   MRN: [id number]  HPI Here for follow up of HIV He is on [employer] and doing well with no missed doses.  He has a long history of poor compliance and has felt that taking sporadically accomplishes the same suppression as daily medication.  He was counseled not to do that.  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  No associated n/v/d with medication. CD4 is 640 and viral load < 20.       Review of Systems  Constitutional: Negative for fatigue.  Gastrointestinal: Negative for diarrhea and nausea.  Skin: Negative for rash.  Neurological: Negative for dizziness.       Objective:   Physical Exam  Constitutional: He appears well-developed and well-nourished. No distress.  HENT:  Mouth/Throat: No oropharyngeal exudate.  Eyes: No scleral icterus.  Cardiovascular: Normal rate, regular rhythm and normal heart sounds.  No murmur heard. Pulmonary/Chest: Effort normal and breath sounds normal. No respiratory distress.  Lymphadenopathy:    He has no cervical adenopathy.  Skin: No rash noted.   SH: no current tobacco       Assessment & Plan:

## 2018-01-21 NOTE — Assessment & Plan Note (Signed)
prevnar given today 

## 2018-01-21 NOTE — Assessment & Plan Note (Signed)
Flu shot today 

## 2018-01-21 NOTE — Addendum Note (Signed)
Addended by: Alberteen Sam on: 01/21/2018 11:32 AM   Modules accepted: Orders

## 2018-01-21 NOTE — Assessment & Plan Note (Signed)
He continues to do well with his salvage regimen rtc 6 months

## 2018-01-22 DIAGNOSIS — E291 Testicular hypofunction: Secondary | ICD-10-CM | POA: Diagnosis not present

## 2018-01-22 NOTE — BH Specialist Note (Signed)
Integrated Behavioral Health Initial Visit  MRN: [id number] Name: Jaiquan Temme  Number of [place] Clinician visits:: 1/6 Session Start time: 2:04pm  Session End time: 2:36pm Total time: 30 minutes  Type of Service: [place] Interpretor:No. Interpretor Name and Language: n/a   Warm Hand Off Completed.       SUBJECTIVE: Azeem Poorman is a 52 y.o. male accompanied by self Patient was referred by Dr Linus Salmons for depressive symptoms. Patient reports the following symptoms/concerns: insomnia, difficulty concentrating, irritability, depressed mood, lack of motivation Duration of problem: unknown; Severity of problem: moderate  OBJECTIVE: Mood: Depressed and Affect: Constricted Risk of harm to self or others: No plan to harm self or others  LIFE CONTEXT: Patient lives with wife and their 54 year old son. Until recently, his 15 year old stepdaughter was also in the home. He has two adult daughters and wife has an adult son, all out of the home. Patient reports there have been problems between him and his wife for the past couple of years, mostly related to affection and intimacy. He reports that he needs physical touch and wife has not wanted to sit next to him or kiss him for a while now. Patient reports that when they are in public wife wants things to look like they are happy, but when they are alone she doesn't want to be a unit at all. He indicates that this is very distressing to him, and that they have tried couple's counseling where the counselor said much the same thing he had been telling wife, but wife did not follow through with suggestions. Patient reports that he and wife have been arguing a lot more, and that he is considering moving out by October.   GOALS ADDRESSED: Patient will: 1. Reduce symptoms of: depression  INTERVENTIONS: Interventions utilized: Motivational Interviewing, Brief CBT and Supportive  Counseling   ASSESSMENT: Patient currently experiencing insomnia, difficulty concentrating, irritability, depressed mood, lack of motivation, and anhedonia. This has been going on for a while, and he reports that his symptoms have increased in frequency and intensity as time has gone on. At this time his symptoms meet the criteria for a diagnosis of Major Depressive Disorder, Single Episode, Moderate.  Counselor explored with patient how symptoms may be related to marital problems and how they are impacting his marriage. Patient and counselor discussed what patient wants to see change in his life, and which of those things he has control of or influence on. Patient expressed a desire for wife to feel better about herself and her body, for her to be more open with him about whatever is going on with her, and for her to understand his needs. Counselor pointed out that these are things patient cannot make happen, and emphasized the importance of focusing on himself and meeting his own needs. Patient and counselor explored how changing the way he thinks and feels about himself as a part of the relationship can impact his behaviors and the relationship itself. Patient expressed feelings of loneliness and stated that he wished he knew a group of individuals who were HIV+ and not all gay. Counselor provided patient with information about and calendar for [employer]. Patient reported being heartened by this and stated he was going to call and get hooked up with a support group at [employer].    Patient may benefit from ongoing CBT.  PLAN: 1. Follow up with behavioral health clinician on :  02/09/18 @11    Rollene Fare  Sheppard Coil, LCSW

## 2018-01-29 ENCOUNTER — Other Ambulatory Visit: Payer: Self-pay | Admitting: Internal Medicine

## 2018-01-29 DIAGNOSIS — B20 Human immunodeficiency virus [HIV] disease: Secondary | ICD-10-CM

## 2018-02-02 MED FILL — TIVICAY 50 MG TABLET: 50 | 30 days supply | Qty: 30 | Fill #0

## 2018-02-02 MED FILL — VALACYCLOVIR HCL 500 MG TAB: 500 | 30 days supply | Qty: 60 | Fill #0

## 2018-02-02 MED FILL — SYMTUZA 800-150-200-10 MG T: 800-150-200 | 30 days supply | Qty: 30 | Fill #0

## 2018-02-09 ENCOUNTER — Institutional Professional Consult (permissible substitution): Payer: Medicare HMO | Admitting: Licensed Clinical Social Worker

## 2018-02-10 DIAGNOSIS — E291 Testicular hypofunction: Secondary | ICD-10-CM | POA: Diagnosis not present

## 2018-02-12 ENCOUNTER — Ambulatory Visit (INDEPENDENT_AMBULATORY_CARE_PROVIDER_SITE_OTHER): Payer: Medicare HMO | Admitting: Licensed Clinical Social Worker

## 2018-02-12 DIAGNOSIS — F331 Major depressive disorder, recurrent, moderate: Secondary | ICD-10-CM

## 2018-02-12 DIAGNOSIS — R69 Illness, unspecified: Secondary | ICD-10-CM | POA: Diagnosis not present

## 2018-02-12 NOTE — BH Specialist Note (Signed)
Integrated Behavioral Health Follow Up Visit  MRN: [id number] Name: Vincent Young  Number of [place] Clinician visits: 2/6 Session Start time: 4:05pm  Session End time: 4:38pm Total time: 30 minutes  Type of Service: [place] Interpretor:No. Interpretor Name and Language: n/a  SUBJECTIVE: Vincent Young is a 52 y.o. male accompanied by self (wife remained in lobby) Patient reports the following symptoms/concerns:  Irritability, stress in marriage,  trouble sleeping, trouble concentrating  OBJECTIVE: Mood: Depressed and Affect: Constricted Risk of harm to self or others: No plan to harm self or others  LIFE CONTEXT: Patient's mood was much improved today from last session. He stated that he and his oldest son (age 38) will be moving out into their own apartment in a month or so. 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 He reported that he will have to begin working at night, but hopes to be able to move around to a daytime schedule soon.   GOALS ADDRESSED: Patient will: 1.  Reduce symptoms of: depression and and anger   INTERVENTIONS: Interventions utilized:  Motivational Interviewing and Supportive Counseling and Reality Therapy  ASSESSMENT: Patient currently experiencing agitation/irritability, insomnia, difficulty concentrating, mood instability. He indicated that he has been feeling better since he made the decision to move out. Patient stated that he and son have talked to the property manager at the new apartment, and that they are currently $600 under the income limit needed to secure the apartment, but he thinks he can get his old job back so that "won't be a problem." Counselor commended patient for making a decision that he believes is in his best interest. Counselor  identified income as a barrier to follow through with his decision, and encouraged patient to make contingency plans in this area. Patient stated that he really does not see any barriers, and that he is pretty sure he will get hired back at his old job, so he does not see the need for backup plans. Counselor guided patient to discuss his motivations and expectations in moving out. Patient identified that he feels not being so close to wife will reduce his stress level, and that he will be in a situation where he can date and experience affection and intimacy again. Counselor explored with patient the possibilities that life in the apartment with his adult son may not be what he is envisioning that it will. Patient was hesitant to consider these, especially the fact that  Dating may not go as he thinks as he has "never had trouble with that before."  He also shared that his ultimate goal is for wife to work on "her issues" so that they can get back together someday. Counselor pointed out that patient's plans for living on his own and his ultimate goal do not seem to line up, and encouraged him to think about how his actions may impact his marriage, if he does not actually plan for it to end. Patient stated that he believes wife now sees that he is serious, and that he thinks this will result in her making changes to get him back. Counselor and patient explored what may happen if wife chooses not to make the changes patient wants to see.   Patient may benefit from ongoing CBT and Reality Therapy sessions.  PLAN: 1. Follow up with behavioral health clinician on : 02/24/18 at [place], LCSW

## 2018-02-23 ENCOUNTER — Ambulatory Visit (INDEPENDENT_AMBULATORY_CARE_PROVIDER_SITE_OTHER): Payer: Medicare HMO | Admitting: Licensed Clinical Social Worker

## 2018-02-23 DIAGNOSIS — R69 Illness, unspecified: Secondary | ICD-10-CM | POA: Diagnosis not present

## 2018-02-23 DIAGNOSIS — F331 Major depressive disorder, recurrent, moderate: Secondary | ICD-10-CM

## 2018-02-23 NOTE — BH Specialist Note (Signed)
Integrated Behavioral Health Follow Up Visit  MRN: [id number] Name: Vincent Young  Number of [place] Clinician visits: 3/6 Session Start time: 11:42am  Session End time: 12:12apm Total time: 30 minutes  Type of Service: [place] Interpretor:No. Interpretor Name and Language: n/a  SUBJECTIVE: [name] is a 52 y.o. 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 His daughter is leaving a job at a hotel, and has been talking to the employer about interviewing him, so patient is hoping that job may come through. Patient reports things at home are okay, but tense, and his main goal is the secure income so that he can move out.   GOALS ADDRESSED: Patient will: 1.  Reduce symptoms of: depression   INTERVENTIONS: Interventions utilized:  Behavioral Activation and Supportive Counseling  ASSESSMENT: Patient currently experiencing irritability, some mood instability, insomnia, trouble concentrating, and a general feeling of stress or anxiety (especially around relationships). Counselor explored with patient the progress he has made toward his goals of getting a job and moving out. Patient explained that the meeting with his former employer did not go the way he had expected, but that he is considering other options. Counselor commended patient for coming up with new ideas, and pointed out that there are many parts of his situation which are not in his control, so being prepared and aware is important for having control in the  areas where he can, such as his own decisions and actions. Patient was very focused on what he wants to see from his wife. Counselor pointed out that her choices and actions are not in his sphere of control, and encouraged patient to focus more on his own. Counselor guided patient to discuss his needs for happiness while in the home with wife and once he moves out. Patient struggled with this, perseverating on what he wants wife and others to do. Counselor pointed out that patient seems to be putting his happiness on relationships with other people, which can make it feel out of reach when the other person does something he does not like. Counselor explored with patient ways to be responsible for his own happiness.   Patient may benefit from ongoing CBT and Reality Therapy twice a month.  PLAN: 1. Follow up with behavioral health clinician on : 03/09/18   Lillie Fragmin, LCSW

## 2018-02-24 ENCOUNTER — Ambulatory Visit: Payer: Medicare HMO | Admitting: Licensed Clinical Social Worker

## 2018-02-24 ENCOUNTER — Other Ambulatory Visit: Payer: Self-pay | Admitting: Internal Medicine

## 2018-02-24 ENCOUNTER — Other Ambulatory Visit: Payer: Self-pay | Admitting: Behavioral Health

## 2018-02-24 DIAGNOSIS — B20 Human immunodeficiency virus [HIV] disease: Secondary | ICD-10-CM

## 2018-02-24 DIAGNOSIS — E291 Testicular hypofunction: Secondary | ICD-10-CM | POA: Diagnosis not present

## 2018-02-24 MED ORDER — DARUN-COBIC-EMTRICIT-TENOFAF 800-150-200-10 MG PO TABS: 1.0000 | ORAL_TABLET | Freq: Every day | ORAL | 5 refills | Status: DC

## 2018-02-24 MED ORDER — DOLUTEGRAVIR SODIUM 50 MG PO TABS: ORAL_TABLET | ORAL | 5 refills | Status: DC

## 2018-02-27 MED FILL — SYMTUZA 800-150-200-10 MG T: 800-150-200 | 30 days supply | Qty: 30 | Fill #0

## 2018-02-27 MED FILL — TIVICAY 50 MG TABLET: 50 | 30 days supply | Qty: 30 | Fill #1

## 2018-02-27 MED FILL — VALACYCLOVIR HCL 500 MG TAB: 500 | 30 days supply | Qty: 60 | Fill #1

## 2018-03-09 ENCOUNTER — Ambulatory Visit: Payer: Medicare HMO | Admitting: Licensed Clinical Social Worker

## 2018-03-18 ENCOUNTER — Ambulatory Visit (INDEPENDENT_AMBULATORY_CARE_PROVIDER_SITE_OTHER): Payer: Medicare HMO | Admitting: Licensed Clinical Social Worker

## 2018-03-18 DIAGNOSIS — F331 Major depressive disorder, recurrent, moderate: Secondary | ICD-10-CM | POA: Diagnosis not present

## 2018-03-18 DIAGNOSIS — R69 Illness, unspecified: Secondary | ICD-10-CM | POA: Diagnosis not present

## 2018-03-18 DIAGNOSIS — E291 Testicular hypofunction: Secondary | ICD-10-CM | POA: Diagnosis not present

## 2018-03-18 NOTE — BH Specialist Note (Signed)
Integrated Behavioral Health Follow Up Visit  MRN: [id number] Name: Vincent Young  Number of [place] Clinician visits: 4/6 Session Start time: 9:14am  Session End time: 9:48am Total time: 30 minutes  Type of Service: [place] Interpretor:No. Interpretor Name and Language: n/a  SUBJECTIVE: Vincent Young is a 52 y.o. male accompanied by self Patient reports the following symptoms/concerns: difficulty sleeping, easily angered, anxiety, trouble making decisions.   OBJECTIVE: Mood: Depressed and Affect: Constricted Risk of harm to self or others: No plan to harm self or others  LIFE CONTEXT: Patient reports that he has not had any job offers, and that he did an assessment with [facility] but did not feel good about it and is going to continue looking elsewhere. He states that wife has expressed interest in marital counseling again, and that he has decided to give the relationship until the end of the year before deciding whether to move out.   GOALS ADDRESSED: Patient will: 1.  Reduce symptoms of: depression    INTERVENTIONS: Interventions utilized:  Motivational Interviewing, Brief CBT and Supportive Counseling  ASSESSMENT: Patient currently experiencing irritability, insomnia, difficulty in decision making, anxiety. He reports that he has given quite a bit of effort lately to being more positive and communicative with wife, but that she continues to be very negative in her interactions with him. Counselor explored with patient recent examples of this. Counselor emphasized to patient that he cannot control wife's attitude or responses, only his own. Patient and counselor discussed patient's level of investment in the relationship, as he was recently intent on moving out. Patient shares that he has made plans for this Friday with a woman he met online, and that he doesn't want to keep them, but that his wife is not  giving him much reason not to. Counselor confronted patient on placing his own decisions on his wife's behaviors. Counselor challenged patient to identify how wife would respond if she knew about the date; he states that she would end the relationship, as she threatened to do so when she discovered him talking to women online in the past. Counselor guided patient to explore whether he is willing to risk his relationship for this date, and encouraged him to decide whether he wants to invest his time and energy into his marriage or into outside endeavors. Patient discussed being torn about whether to invest in the marriage when he is unsure whether wife will. Counselor and patient explored how divided investment could have the result of not having either relationship. Counselor encouraged patient to think about what meaning he is giving the marriage and to act accordingly.  Patient may benefit from ongoing CBT twice per month.  PLAN: 1. Follow up with behavioral health clinician on : 04/01/18 @ [place], LCSW

## 2018-03-26 MED FILL — SYMTUZA 800-150-200-10 MG T: 800-150-200 | 30 days supply | Qty: 30 | Fill #1

## 2018-03-26 MED FILL — TIVICAY 50 MG TABLET: 50 | 30 days supply | Qty: 30 | Fill #2

## 2018-04-01 ENCOUNTER — Ambulatory Visit (INDEPENDENT_AMBULATORY_CARE_PROVIDER_SITE_OTHER): Payer: Medicare HMO | Admitting: Licensed Clinical Social Worker

## 2018-04-01 DIAGNOSIS — R69 Illness, unspecified: Secondary | ICD-10-CM | POA: Diagnosis not present

## 2018-04-01 DIAGNOSIS — F331 Major depressive disorder, recurrent, moderate: Secondary | ICD-10-CM | POA: Diagnosis not present

## 2018-04-01 NOTE — BH Specialist Note (Signed)
Integrated Behavioral Health Follow Up Visit  MRN: [id number] Name: Vincent Young  Number of [place] Clinician visits: 5/6 Session Start time:9:12am  Session End time: 9:50am Total time: 40 minutes  Type of Service: [place] Interpretor:No. Interpretor Name and Language: n/a  SUBJECTIVE: Vincent Young is a 52 y.o. male accompanied by self Patient reports the following symptoms/concerns: some anxiety, irritability, difficulty with sleep  OBJECTIVE: Mood: Euthymic and Affect: Blunt Risk of harm to self or others: No plan to harm self or others  LIFE CONTEXT: Patient reports that he and his wife have been getting along better, though he wonders if there is a specific reason for that. He states that he is no longer focused on moving out, and is no longer interested in meeting with the other woman he had been talking to online/texting. However, patient states that both of these options are in the back of his mind if things with wife don't work out and though he does not want to focus on that, it does provide him some comfort. Older son has come to stay with patient, wife and younger son for a while (this is the one who was originally going to get an apartment with patient when he moved out), and wife's daughter will be home from college for the winter break soon. Patient reports being a bit anxious about step-daughter returning home, as she was a point of frustration with him and wife before she left for college.   GOALS ADDRESSED: Patient will: 1.  Reduce symptoms of: depression   INTERVENTIONS: Interventions utilized:  Brief CBT and Supportive Counseling  ASSESSMENT: Patient currently experiencing irritability, anxiety, blunted affect and insomnia. He reports that some days he is still feeling a bit down, but this is less often and less extreme than before. Counselor guided patient to explore the changes he has seen in his  mood symptoms lately. Patient identified that not only have he and wife been more connected, but he has made the decision to try and have more realistic expectations of the relationship. Counselor and patient discussed what may be realistic and "reasonable" to expect. Patient shared that wife has been more affectionate with him, and has allowed him to be more affectionate with her. He believes this may have to do with her wanting his help and support in the holiday season, with travel, for example. Counselor processed with patient his thoughts and feelings about this. Patient states that if he is being used by wife he is okay with it at this point, because he is getting something out of it (connection with her) and he hopes that them having more affection will be positive for the relationship, whatever the motivation for it. He went on to talk about his frustrations with not knowing how to get wife to be more attentive and affectionate more often. Counselor challenged patient on whether he has talked to wife about what helps her feel connected, or whether he is trying to "read her mind" on the matter. Patient was able to recognize that he has been attempting to mind read, and discussed with counselor how this is a cognitive distortion that does not seem to be helpful to the marriage. Counselor emphasized to patient that he cannot expect himself to meet wife's needs without knowing what they are, and that he cannot know what they are unless they discuss them. Counselor encouraged patient to address this from a place of wanting to know what helps wife feel connected, rather  than simply asking her what she wants. Patient was amenable to this idea, and brainstormed ways to address with wife her needs for feeling loved and connected. Counselor re-emphasized to patient that wife will ultimately have to choose what she shares with him and whether she wants to be more affectionate, and that he can only control his own  actions and attitudes toward her.   Patient may benefit from ongoing CBT sessions twice monthly.  PLAN: 1. Follow up with behavioral health clinician on : 04/13/18 @ [place], LCSW

## 2018-04-02 DIAGNOSIS — E291 Testicular hypofunction: Secondary | ICD-10-CM | POA: Diagnosis not present

## 2018-04-13 ENCOUNTER — Ambulatory Visit (INDEPENDENT_AMBULATORY_CARE_PROVIDER_SITE_OTHER): Payer: Medicare HMO | Admitting: Licensed Clinical Social Worker

## 2018-04-13 DIAGNOSIS — R69 Illness, unspecified: Secondary | ICD-10-CM | POA: Diagnosis not present

## 2018-04-13 DIAGNOSIS — F331 Major depressive disorder, recurrent, moderate: Secondary | ICD-10-CM

## 2018-04-13 NOTE — BH Specialist Note (Signed)
Integrated Behavioral Health Follow Up Visit  MRN: [id number] Name: Vincent Young  Session Start time: 9:10am  Session End time: 9:50am Total time: 40 minutes  Type of Service: [place] Interpretor:No. Interpretor Name and Language: n/a  SUBJECTIVE: Vincent Young is a 52 y.o. male accompanied by self Patient reports the following symptoms/concerns: irritability, stress in marriage, difficulty sleeping, low self-concept.  OBJECTIVE: Mood: Depressed and Affect: Constricted Risk of harm to self or others: No plan to harm self or others  LIFE CONTEXT: Patient reports that he has decided to completely commit to his marriage; he is no longer looking at moving out and is in fact planning to buy their rental house with his wife in the coming year. He reports that he has deleted the dating app he was using in order to focus his energy more intentionally on his wife and marriage. Patient's older son (age 53) has moved in with them, and he reports this is going well. 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   GOALS ADDRESSED: Patient will: 1.  Reduce symptoms of: depression   INTERVENTIONS: Interventions utilized:  Brief CBT and Supportive Counseling  ASSESSMENT: Patient currently experiencing  irritability, stress in marriage, difficulty sleeping, low self-concept. He reports that things are going better with his wife in terms of them communicating more and her being more affectionate/responsive to his affection. Counselor and patient explored how the changes patient has seen inspired him to move forward in his marriage. Patient identified that he sees wife putting in effort and this tells him that she does still care. He identified that not only is this the longest relationship he has ever been in, but he has been more vulnerable and authentic with her than in any past relationships  and has wondered if she has been taking advantage of this. Counselor normalized patient's experiences, and pointed out that more meaningful relationships do come with increased vulnerability. Counselor encouraged patient to consider whether wife has taken advantage of this, or patient is simply unused to and uncomfortable with the vulnerability. Patient explored whether it is healthy to expect a spouse to make one happy, or whether one should find happiness within themselves. Counselor guided patient in discussing the issue of control/ power in these scenarios, and emphasized that allowing someone else,even a spouse, to be responsible for one's happiness gives away control for one's own emotions. Patient and counselor discussed boundaries with oneself and including a spouse in one's happiness rather than making the spouse one's happiness. Patient demonstrated understanding, and verbalized where he has been asking wife to be his happiness, leaving him in an unhappy state when she is unwilling or unable to give him what he identifies needing in order to be happy. Counselor explored with patient how this reinforces his depressive cycles.   Patient may benefit from ongoing CBT sessions every 2-3 weeks.   PLAN: 1. Follow up with behavioral health clinician on : 04/20/18 at [facility], LCSW

## 2018-04-21 MED FILL — TIVICAY 50 MG TABLET: 50 | 30 days supply | Qty: 30 | Fill #3

## 2018-04-21 MED FILL — SYMTUZA 800-150-200-10 MG T: 800-150-200 | 30 days supply | Qty: 30 | Fill #2

## 2018-04-21 MED FILL — VALACYCLOVIR HCL 500 MG TAB: 500 | 30 days supply | Qty: 60 | Fill #2

## 2018-04-23 DIAGNOSIS — G4733 Obstructive sleep apnea (adult) (pediatric): Secondary | ICD-10-CM | POA: Diagnosis not present

## 2018-04-23 DIAGNOSIS — E119 Type 2 diabetes mellitus without complications: Secondary | ICD-10-CM | POA: Diagnosis not present

## 2018-04-23 DIAGNOSIS — M25512 Pain in left shoulder: Secondary | ICD-10-CM | POA: Diagnosis not present

## 2018-04-23 DIAGNOSIS — R69 Illness, unspecified: Secondary | ICD-10-CM | POA: Diagnosis not present

## 2018-04-23 DIAGNOSIS — B009 Herpesviral infection, unspecified: Secondary | ICD-10-CM | POA: Diagnosis not present

## 2018-04-23 DIAGNOSIS — E291 Testicular hypofunction: Secondary | ICD-10-CM | POA: Diagnosis not present

## 2018-04-23 DIAGNOSIS — E7800 Pure hypercholesterolemia, unspecified: Secondary | ICD-10-CM | POA: Diagnosis not present

## 2018-04-23 DIAGNOSIS — I10 Essential (primary) hypertension: Secondary | ICD-10-CM | POA: Diagnosis not present

## 2018-04-24 ENCOUNTER — Ambulatory Visit (INDEPENDENT_AMBULATORY_CARE_PROVIDER_SITE_OTHER): Payer: Medicare HMO | Admitting: Licensed Clinical Social Worker

## 2018-04-24 DIAGNOSIS — E291 Testicular hypofunction: Secondary | ICD-10-CM | POA: Diagnosis not present

## 2018-04-24 DIAGNOSIS — R69 Illness, unspecified: Secondary | ICD-10-CM | POA: Diagnosis not present

## 2018-04-24 DIAGNOSIS — F331 Major depressive disorder, recurrent, moderate: Secondary | ICD-10-CM

## 2018-04-24 NOTE — BH Specialist Note (Signed)
Integrated Behavioral Health Follow Up Visit  MRN: [id number] Name: Elby Blackwelder   Session Start time: 9:50am  Session End time: 10:21am Total time: 30 minutes  Type of Service: [place] Interpretor:No. Interpretor Name and Language: n/a  SUBJECTIVE: Vincent Young is a 52 y.o. male accompanied by self Patient reports the following symptoms/concerns: irritability, trouble concentrating  OBJECTIVE: Mood: Depressed and Affect: Constricted Risk of harm to self or others: No plan to harm self or others  LIFE CONTEXT: Patient is still living in the home with wife, but is again considering moving out. He states the does not believe the marriage can be repaired because wife is showing him she is not willing to work on it. Patient reports that his elders at church have spoken with him about not divorcing wife, but that he is considering a separation.  GOALS ADDRESSED: Patient will: 1.  Reduce symptoms of: depression    INTERVENTIONS: Interventions utilized:  Brief CBT and Supportive Counseling   ASSESSMENT: Patient currently experiencing frustrations with wife/marriage, irritability, difficulty concentrating. He states that wife has become argumentative again and is pushing him away when he tries to be kind or affectionate. Counselor guided patient in functional analysis of recent situations with wife. Patient reports belief that wife's college freshman daughter not coming home for the holidays ist he root of wife's moodiness but she will not talk to him about it. Counselor explored with patient his thoughts and feelings about his own oldest child (daughter) moving to [place] over the weekend. Patient indicates that he misses her very much and is already planning a visit. He processed their relationship, pointing out that he often went to her house when he was frustrated with marital issues and that they spent Fridays together. Counselor  normalized and validated the sense of loss patient is experiencing. Counselor pointed out that patient and wife are both experiencing similar losses of the presence of their adult children, even thought he circumstances are different, and encouraged patient to connect with wife over their shared emotions. Patient stated he mentioned it during an argument, and that his daughter has health problems (on dialysis) and so he misses her even more because of this. Counselor emphasized to patient the importance of making connection rather than comparison. Counselor explored with patient how the way that he addressed this could feel like an attack on wife's emotions rather than a way to draw closer together. Counselor practiced with patient ways to share his feelings and make the conversation connective rather than combative.   Patient may benefit from ongoing CBT sessions every other week.  PLAN: 1. Follow up with behavioral health clinician on : 05/04/18 @ [place], LCSW

## 2018-05-04 ENCOUNTER — Ambulatory Visit (INDEPENDENT_AMBULATORY_CARE_PROVIDER_SITE_OTHER): Payer: Medicare HMO | Admitting: Licensed Clinical Social Worker

## 2018-05-04 DIAGNOSIS — F331 Major depressive disorder, recurrent, moderate: Secondary | ICD-10-CM

## 2018-05-04 DIAGNOSIS — R69 Illness, unspecified: Secondary | ICD-10-CM | POA: Diagnosis not present

## 2018-05-04 NOTE — BH Specialist Note (Signed)
Integrated Behavioral Health Follow Up Visit  MRN: [id number] Name: Vincent Young  Session Start time: 9:10am  Session End time: 9:43am Total time: 30 minutes  Type of Service: [place] Interpretor:No. Interpretor Name and Language: n/a  SUBJECTIVE: Vincent Young is a 52 y.o. male accompanied by self Patient reports the following symptoms/concerns: trouble sleeping, difficulty concentrating, depressed mood, irritability  OBJECTIVE: Mood: Depressed and Affect: Constricted Risk of harm to self or others: No plan to harm self or others  LIFE CONTEXT: Patient reports that his 46 year wedding anniversary was this weekend "but you wouldn't have known it" because wife did not want to recognize the day. He states that wife is "not trying anymore" to work on the marriage, and things have gone back to where they were, with no closeness or affection. Patient states that he and wife are always at odds again, and that he has reinstated his plan to move out with his 75 year old son.   GOALS ADDRESSED: Patient will: 1.  Reduce symptoms of: depression    INTERVENTIONS: Interventions utilized:  Brief CBT and Supportive Counseling  ASSESSMENT: Patient currently experiencing difficulty concentrating, difficulty sleeping, irritability, depressed mood. He stated that he has been getting irritated lately at the way people treat others with disabilities or illnesses. Recently, patient states, he made a social media post reminding everyone with an illness or handicap that they are not "damaged goods," and that there are still people out there who cherish them. Counselor explored with patient his reason for making this post. Patient states that wife thinks he will not leave because he will not find anyone else who will accept his HIV+ status, but that he does not believe he is any less worthy of love and affection than a person who is HIV-. Counselor commended patient  on this, and guided him to discuss how the stigma against HIV has impacted him. Patient was able to identify times and ways that he has felt de-valued due to his status. He talked about his desire to find a support group geared toward heterosexual people living with HIV who want to date. Counselor pointed out that this may create just the type of stigma patient was speaking against. Counselor and patient explored ways that patient may be able to connect with others in the community in ways that are empowering to him and others.   Patient may benefit from ongoing CBT sessions.  PLAN: 1. Follow up with behavioral health clinician on : 05/21/18 @9am    Lillie Fragmin, LCSW

## 2018-05-18 DIAGNOSIS — E291 Testicular hypofunction: Secondary | ICD-10-CM | POA: Diagnosis not present

## 2018-05-21 ENCOUNTER — Ambulatory Visit: Payer: Medicare HMO | Admitting: Licensed Clinical Social Worker

## 2018-05-25 MED FILL — SYMTUZA 800-150-200-10 MG T: 800-150-200 | 30 days supply | Qty: 30 | Fill #3

## 2018-05-25 MED FILL — VALACYCLOVIR HCL 500 MG TAB: 500 | 30 days supply | Qty: 60 | Fill #3

## 2018-05-25 MED FILL — TIVICAY 50 MG TABLET: 50 | 30 days supply | Qty: 30 | Fill #4

## 2018-06-01 DIAGNOSIS — E291 Testicular hypofunction: Secondary | ICD-10-CM | POA: Diagnosis not present

## 2018-06-13 ENCOUNTER — Emergency Department (HOSPITAL_COMMUNITY)
Admission: EM | Admit: 2018-06-13 | Discharge: 2018-06-13 | Disposition: A | Discharge status: Home / Self Care | Payer: Medicare HMO | Attending: Emergency Medicine | Admitting: Emergency Medicine

## 2018-06-13 ENCOUNTER — Other Ambulatory Visit: Payer: Self-pay

## 2018-06-13 ENCOUNTER — Encounter (HOSPITAL_COMMUNITY): Payer: Self-pay | Admitting: Emergency Medicine

## 2018-06-13 DIAGNOSIS — Z87891 Personal history of nicotine dependence: Secondary | ICD-10-CM | POA: Diagnosis not present

## 2018-06-13 DIAGNOSIS — E119 Type 2 diabetes mellitus without complications: Secondary | ICD-10-CM | POA: Insufficient documentation

## 2018-06-13 DIAGNOSIS — I10 Essential (primary) hypertension: Secondary | ICD-10-CM | POA: Diagnosis not present

## 2018-06-13 DIAGNOSIS — R109 Unspecified abdominal pain: Secondary | ICD-10-CM | POA: Diagnosis not present

## 2018-06-13 DIAGNOSIS — R197 Diarrhea, unspecified: Secondary | ICD-10-CM | POA: Diagnosis not present

## 2018-06-13 DIAGNOSIS — J45909 Unspecified asthma, uncomplicated: Secondary | ICD-10-CM | POA: Diagnosis not present

## 2018-06-13 DIAGNOSIS — Z79899 Other long term (current) drug therapy: Secondary | ICD-10-CM | POA: Insufficient documentation

## 2018-06-13 DIAGNOSIS — R69 Illness, unspecified: Secondary | ICD-10-CM | POA: Diagnosis not present

## 2018-06-13 DIAGNOSIS — R112 Nausea with vomiting, unspecified: Secondary | ICD-10-CM | POA: Diagnosis not present

## 2018-06-13 DIAGNOSIS — B20 Human immunodeficiency virus [HIV] disease: Secondary | ICD-10-CM | POA: Insufficient documentation

## 2018-06-13 LAB — URINALYSIS, ROUTINE W REFLEX MICROSCOPIC
Bilirubin Urine: NEGATIVE
Glucose, UA: NEGATIVE mg/dL
Ketones, ur: 5 mg/dL — AB
Leukocytes, UA: NEGATIVE
Nitrite: NEGATIVE
Protein, ur: 100 mg/dL — AB
Specific Gravity, Urine: 1.026 (ref 1.005–1.030)
pH: 5 (ref 5.0–8.0)

## 2018-06-13 LAB — COMPREHENSIVE METABOLIC PANEL
ALT: 27 U/L (ref 0–44)
AST: 25 U/L (ref 15–41)
Albumin: 3.2 g/dL — ABNORMAL LOW (ref 3.5–5.0)
Alkaline Phosphatase: 58 U/L (ref 38–126)
Anion gap: 11 (ref 5–15)
BUN: 8 mg/dL (ref 6–20)
CO2: 22 mmol/L (ref 22–32)
Calcium: 8.4 mg/dL — ABNORMAL LOW (ref 8.9–10.3)
Chloride: 107 mmol/L (ref 98–111)
Creatinine, Ser: 1.4 mg/dL — ABNORMAL HIGH (ref 0.61–1.24)
GFR calc Af Amer: >60 mL/min (ref >60)
GFR calc non Af Amer: 57 mL/min — ABNORMAL LOW (ref >60)
Glucose, Bld: 126 mg/dL — ABNORMAL HIGH (ref 70–99)
Potassium: 3.2 mmol/L — ABNORMAL LOW (ref 3.5–5.1)
Sodium: 140 mmol/L (ref 135–145)
Total Bilirubin: 0.7 mg/dL (ref 0.3–1.2)
Total Protein: 7.1 g/dL (ref 6.5–8.1)

## 2018-06-13 LAB — CBC
HCT: 47.4 % (ref 39.0–52.0)
Hemoglobin: 15.1 g/dL (ref 13.0–17.0)
MCH: 29.7 pg (ref 26.0–34.0)
MCHC: 31.9 g/dL (ref 30.0–36.0)
MCV: 93.1 fL (ref 80.0–100.0)
Platelets: 238 10*3/uL (ref 150–400)
RBC: 5.09 MIL/uL (ref 4.22–5.81)
RDW: 14.1 % (ref 11.5–15.5)
WBC: 6.5 10*3/uL (ref 4.0–10.5)
nRBC: 0 % (ref 0.0–0.2)

## 2018-06-13 LAB — LIPASE, BLOOD: Lipase: 21 U/L (ref 11–51)

## 2018-06-13 MED ORDER — ONDANSETRON 4 MG PO TBDP: 4.0000 mg | ORAL_TABLET | Freq: Three times a day (TID) | ORAL | 0 refills | Status: DC | PRN

## 2018-06-13 MED ORDER — SODIUM CHLORIDE 0.9% FLUSH: 3.0000 mL | Freq: Once | INTRAVENOUS | Status: DC

## 2018-06-13 MED ORDER — MAGNESIUM OXIDE 400 (241.3 MG) MG PO TABS
800.0000 mg | ORAL_TABLET | Freq: Once | ORAL | Status: AC
  Administered 2018-06-13: 800 mg via ORAL
  Filled 2018-06-13: qty 2

## 2018-06-13 MED ORDER — POTASSIUM CHLORIDE CRYS ER 20 MEQ PO TBCR
40.0000 meq | EXTENDED_RELEASE_TABLET | Freq: Once | ORAL | Status: AC
  Administered 2018-06-13: 40 meq via ORAL
  Filled 2018-06-13: qty 2

## 2018-06-13 MED ORDER — ONDANSETRON 4 MG PO TBDP
4.0000 mg | ORAL_TABLET | Freq: Once | ORAL | Status: AC
  Administered 2018-06-13: 4 mg via ORAL
  Filled 2018-06-13: qty 1

## 2018-06-13 NOTE — ED Triage Notes (Signed)
C/o black diarrhea and vomiting x 5-6 days.  Also reports intermittent mid upper abd pain.  Denies pain at present.  Denies nausea.

## 2018-06-13 NOTE — ED Provider Notes (Signed)
[facility] Provider Note   CSN: [id number] Arrival date & time: 06/13/18  2119     History   Chief Complaint Chief Complaint  Patient presents with  . Emesis  . Diarrhea  . Abdominal Pain    HPI Vincent Young is a 53 y.o. male.  53 yo M with a chief complaint of nausea vomiting diarrhea.  This been going on for about 5 days.  He denies sick contacts or people with similar illness.  Denies suspicious food intake denies recent travel.  Had fevers at the onset of illness which is resolved.  Describing very loose and dark stool.  Denies lightheadedness or dizziness denies shortness of breath denies cough congestion.  He describes some mild epigastric discomfort right after he eats which usually resolves.  The history is provided by the patient.  Emesis  Associated symptoms: abdominal pain, diarrhea and fever (subjective at onset, now resolved)   Associated symptoms: no arthralgias, no chills, no headaches and no myalgias   Diarrhea  Associated symptoms: abdominal pain, fever (subjective at onset, now resolved) and vomiting   Associated symptoms: no arthralgias, no chills, no headaches and no myalgias   Abdominal Pain  Associated symptoms: diarrhea, fever (subjective at onset, now resolved) and vomiting   Associated symptoms: no chest pain, no chills and no shortness of breath   Illness  Severity:  Mild Onset quality:  Gradual Duration:  5 days Timing:  Constant Progression:  Partially resolved Chronicity:  New Associated symptoms: abdominal pain, diarrhea, fever (subjective at onset, now resolved) and vomiting   Associated symptoms: no chest pain, no congestion, no headaches, no myalgias, no rash and no shortness of breath     Past Medical History:  Diagnosis Date  . Acute renal failure (HCC)    due to dehydration  . Arthritis   . Asthma   . Diabetes mellitus without complication ([place])   . DJD (degenerative joint disease)   .  Exposure to TB   . Family history of adverse reaction to anesthesia    " My brother has had PONV"  . GERD (gastroesophageal reflux disease)   . Headache   . HIV infection ([place])   . Hypertension   . Obesity   . Pilonidal cyst   . Recurrent genital HSV (herpes simplex virus) infection   . Sleep apnea    never tested-has s/s    Patient Active Problem List   Diagnosis Date Noted  . Need for prophylactic vaccination against Streptococcus pneumoniae (pneumococcus) 01/21/2018  . Obesity due to excess calories with serious comorbidity and body mass index (BMI) greater than 99th percentile for age in pediatric patient 11/21/2016  . 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 Vaccine counseling 11/21/2016  . Screening examination for venereal disease 05/31/2014  . Encounter for long-term (current) use of medications 05/31/2014  . Bronchitis, allergic 10/19/2013  . Healthcare maintenance 02/02/2013  . OBESITY, MORBID 11/02/2009  . PERIPHERAL NEUROPATHY 11/02/2009  . Herpes simplex virus (HSV) infection 11/01/2009  . TONSILLECTOMY, HX OF 11/01/2009  . Human immunodeficiency virus (HIV) disease ([place]) 10/12/2009    Past Surgical History:  Procedure Laterality Date  . APPENDECTOMY    . 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 COLONOSCOPY W/ BIOPSIES AND POLYPECTOMY    .  COLONOSCOPY WITH PROPOFOL N/A 08/29/2014   Procedure: COLONOSCOPY WITH PROPOFOL;  Surgeon: Clarene Essex, MD;  Location: [facility];  Service: Endoscopy;  Laterality: N/A;  . HERNIA REPAIR     inguinal as a child  . PEG TUBE PLACEMENT  2009  . TONSILLECTOMY    . UVULECTOMY  2009        Home Medications    Prior to Admission medications   Medication Sig Start Date  End Date Taking? Authorizing Provider  albuterol (PROVENTIL HFA;VENTOLIN HFA) 108 (90 BASE) MCG/ACT inhaler Inhale 1-2 puffs into the lungs every 6 (six) hours as needed for wheezing or shortness of breath. Patient not taking: Reported on 06/30/2017 05/15/14   Ezequiel Essex, MD  atorvastatin (LIPITOR) 20 MG tablet Take 20 mg by mouth daily. 01/15/18   [provider]  beclomethasone (BECONASE-AQ) 42 MCG/SPRAY nasal spray Place 1 spray into both nostrils 2 (two) times daily. Dose is for each nostril. 06/30/17   Comer, Okey Regal, MD  ciprofloxacin (CIPRO) 500 MG tablet Take 1 tablet (500 mg total) by mouth 2 (two) times daily. Patient not taking: Reported on 01/21/2018 07/20/17   Lajean Saver, MD  Darunavir-Cobicisctat-Emtricitabine-Tenofovir Alafenamide [facility]) 800-150-200-10 MG TABS Take 1 tablet by mouth daily with breakfast. Take with Tivicay 02/24/18   Comer, Okey Regal, MD  diphenhydrAMINE (BENADRYL) 25 MG tablet Take 2 tablets (50 mg total) by mouth every 8 (eight) hours as needed (allergic reaction). 04/03/14   Jola Schmidt, MD  dolutegravir (TIVICAY) 50 MG tablet TAKE 1 TABLET (50 MG TOTAL) BY MOUTH DAILY. Take with Symtuza 02/24/18   Comer, Okey Regal, MD  ibuprofen (ADVIL,MOTRIN) 800 MG tablet Take 1 tablet (800 mg total) by mouth 3 (three) times daily. 08/14/15   Mackuen, Courteney Lyn, MD  indomethacin (INDOCIN) 25 MG capsule Take 1 capsule (25 mg total) by mouth 3 (three) times daily as needed. 09/19/17   Lawyer, Harrell Gave, PA-C  ketoconazole (NIZORAL) 2 % cream APPLY 1 FINGERTIP AMOUNT TO EACH FOOT DAILY. 05/29/17   Evelina Bucy, DPM  ondansetron (ZOFRAN ODT) 4 MG disintegrating tablet Take 1 tablet (4 mg total) by mouth every 8 (eight) hours as needed for nausea or vomiting. 06/13/18   Deno Etienne, DO  oxyCODONE-acetaminophen (PERCOCET/ROXICET) 5-325 MG tablet Take 1 tablet by mouth every 6 (six) hours as needed for severe pain. 09/19/17   Lawyer, Harrell Gave, PA-C  testosterone  cypionate (DEPOTESTOSTERONE CYPIONATE) 200 MG/ML injection  01/07/18   [provider]  valACYclovir (VALTREX) 500 MG tablet TAKE 2 TABLETS (1,000 MG TOTAL) BY MOUTH DAILY. 01/29/18   Comer, Okey Regal, MD    Family History Family History  Problem Relation Age of Onset  . Heart disease Father   . Hypertension Father   . Hypertension Mother   . Arthritis Mother   . Hypertension Sister   . Arthritis Sister   . Hypertension Brother     Social History Social History   Tobacco Use  . Smoking status: Former Smoker    Last attempt to quit: 12/04/2003    Years since quitting: 14.5  . Smokeless tobacco: Never Used  . Tobacco comment: pt. no longer smokes  Substance Use Topics  . Alcohol use: Yes    Alcohol/week: 0.0 standard drinks    Comment: occasional  . Drug use: Yes    Types: Marijuana    Comment: occasional     Allergies   Shellfish allergy; Other; and Penicillins   Review of Systems Review of Systems  Constitutional: Positive  for fever (subjective at onset, now resolved). Negative for chills.  HENT: Negative for congestion and facial swelling.   Eyes: Negative for discharge and visual disturbance.  Respiratory: Negative for shortness of breath.   Cardiovascular: Negative for chest pain and palpitations.  Gastrointestinal: Positive for abdominal pain, diarrhea and vomiting.  Musculoskeletal: Negative for arthralgias and myalgias.  Skin: Negative for color change and rash.  Neurological: Negative for tremors, syncope and headaches.  Psychiatric/Behavioral: Negative for confusion and dysphoric mood.     Physical Exam Updated Vital Signs BP (!) 145/99 (BP Location: Right Arm)   Pulse (!) 106   Temp 98.6 F (37 C) (Oral)   Resp 18   SpO2 95%   Physical Exam Vitals signs and nursing note reviewed.  Constitutional:      Appearance: He is well-developed. He is obese.  HENT:     Head: Normocephalic and atraumatic.  Eyes:     Pupils: Pupils are equal,  round, and reactive to light.  Neck:     Musculoskeletal: Normal range of motion and neck supple.     Vascular: No JVD.  Cardiovascular:     Rate and Rhythm: Normal rate and regular rhythm.     Heart sounds: No murmur. No friction rub. No gallop.   Pulmonary:     Effort: No respiratory distress.     Breath sounds: No wheezing.  Abdominal:     General: There is no distension.     Tenderness: There is no abdominal tenderness. There is no guarding or rebound.  Musculoskeletal: Normal range of motion.  Skin:    Coloration: Skin is not pale.     Findings: No rash.  Neurological:     Mental Status: He is alert and oriented to person, place, and time.  Psychiatric:        Behavior: Behavior normal.      ED Treatments / Results  Labs (all labs ordered are listed, but only abnormal results are displayed) Labs Reviewed  COMPREHENSIVE METABOLIC PANEL - Abnormal; Notable for the following components:      Result Value   Potassium 3.2 (*)    Glucose, Bld 126 (*)    Creatinine, Ser 1.40 (*)    Calcium 8.4 (*)    Albumin 3.2 (*)    GFR calc non Af Amer 57 (*)    All other components within normal limits  URINALYSIS, ROUTINE W REFLEX MICROSCOPIC - Abnormal; Notable for the following components:   Color, Urine AMBER (*)    APPearance HAZY (*)    Hgb urine dipstick SMALL (*)    Ketones, ur 5 (*)    Protein, ur 100 (*)    Bacteria, UA RARE (*)    All other components within normal limits  LIPASE, BLOOD  CBC    EKG None  Radiology No results found.  Procedures Procedures (including critical care time)  Medications Ordered in ED Medications  sodium chloride flush (NS) 0.9 % injection 3 mL (has no administration in time range)  potassium chloride SA (K-DUR,KLOR-CON) CR tablet 40 mEq (has no administration in time range)  magnesium oxide (MAG-OX) tablet 800 mg (has no administration in time range)  ondansetron (ZOFRAN-ODT) disintegrating tablet 4 mg (has no administration in  time range)     Initial Impression / Assessment and Plan / ED Course  I have reviewed the triage vital signs and the nursing notes.  Pertinent labs & imaging results that were available during my care of the patient were reviewed  by me and considered in my medical decision making (see chart for details).     53 yo M with nausea vomiting diarrhea.  Benign abdominal exam for me.  Patient mildly dehydrated on his lab work, potassium mildly low.  We will give a dose of potassium and magnesium here.  He is tolerating by mouth in the ED.  Hemoglobin is at baseline.  Prescription for Zofran.  Imodium for diarrhea.  PCP follow-up.  7:47 AM:  I have discussed the diagnosis/risks/treatment options with the patient and believe the pt to be eligible for discharge home to follow-up with PCP. We also discussed returning to the ED immediately if new or worsening sx occur. We discussed the sx which are most concerning (e.g., sudden worsening pain, fever, inability to tolerate by mouth) that necessitate immediate return. Medications administered to the patient during their visit and any new prescriptions provided to the patient are listed below.  Medications given during this visit Medications  sodium chloride flush (NS) 0.9 % injection 3 mL (has no administration in time range)  potassium chloride SA (K-DUR,KLOR-CON) CR tablet 40 mEq (has no administration in time range)  magnesium oxide (MAG-OX) tablet 800 mg (has no administration in time range)  ondansetron (ZOFRAN-ODT) disintegrating tablet 4 mg (has no administration in time range)     The patient appears reasonably screen and/or stabilized for discharge and I doubt any other medical condition or other [facility] requiring further screening, evaluation, or treatment in the ED at this time prior to discharge.      Final Clinical Impressions(s) / ED Diagnoses   Final diagnoses:  Nausea vomiting and diarrhea    ED Discharge Orders         Ordered     ondansetron (ZOFRAN ODT) 4 MG disintegrating tablet  Every 8 hours PRN     06/13/18 [place], Renner [place], DO 06/13/18 [phone]

## 2018-06-13 NOTE — Discharge Instructions (Addendum)
Take Zofran for your nausea, take Imodium for diarrhea.  Try the brat diet bananas rice applesauce and toast and then he can move on to more complex foods.  Your potassium was mildly low here.  Call your family doctor on Monday and discuss your visit and see when they want see back in the office.  As discussed please return for sudden worsening abdominal pain fever or blood in your stool.

## 2018-06-13 NOTE — ED Notes (Signed)
Pt verbalized understanding of d/c instructions and has no further questions, VSS, NAD.  

## 2018-06-16 DIAGNOSIS — E291 Testicular hypofunction: Secondary | ICD-10-CM | POA: Diagnosis not present

## 2018-06-24 ENCOUNTER — Ambulatory Visit: Payer: Medicare HMO | Admitting: Licensed Clinical Social Worker

## 2018-07-01 MED FILL — TIVICAY 50 MG TABLET: 50 | 30 days supply | Qty: 30 | Fill #5

## 2018-07-01 MED FILL — SYMTUZA 800-150-200-10 MG T: 800-150-200 | 30 days supply | Qty: 30 | Fill #4

## 2018-07-01 MED FILL — VALACYCLOVIR HCL 500 MG TAB: 500 | 30 days supply | Qty: 60 | Fill #4

## 2018-07-03 DIAGNOSIS — E291 Testicular hypofunction: Secondary | ICD-10-CM | POA: Diagnosis not present

## 2018-07-17 DIAGNOSIS — E291 Testicular hypofunction: Secondary | ICD-10-CM | POA: Diagnosis not present

## 2018-07-22 ENCOUNTER — Other Ambulatory Visit (HOSPITAL_COMMUNITY)
Admission: RE | Admit: 2018-07-22 | Discharge: 2018-07-22 | Disposition: A | Discharge status: Home / Self Care | Payer: Medicare HMO | Source: Ambulatory Visit | Attending: Internal Medicine | Admitting: Internal Medicine

## 2018-07-22 ENCOUNTER — Other Ambulatory Visit: Payer: Medicare HMO

## 2018-07-22 ENCOUNTER — Ambulatory Visit: Payer: Medicare HMO | Admitting: Licensed Clinical Social Worker

## 2018-07-22 DIAGNOSIS — B20 Human immunodeficiency virus [HIV] disease: Secondary | ICD-10-CM

## 2018-07-22 DIAGNOSIS — Z113 Encounter for screening for infections with a predominantly sexual mode of transmission: Secondary | ICD-10-CM | POA: Insufficient documentation

## 2018-07-22 DIAGNOSIS — R69 Illness, unspecified: Secondary | ICD-10-CM | POA: Diagnosis not present

## 2018-07-23 LAB — T-HELPER CELL (CD4) - (RCID CLINIC ONLY)
CD4 % Helper T Cell: 17 % — ABNORMAL LOW (ref 33–55)
CD4 T Cell Abs: 640 /uL (ref 400–2700)

## 2018-07-23 LAB — URINE CYTOLOGY ANCILLARY ONLY
Chlamydia: NEGATIVE
Neisseria Gonorrhea: NEGATIVE

## 2018-07-24 LAB — CBC WITH DIFFERENTIAL/PLATELET
Absolute Monocytes: 865 cells/uL (ref 200–950)
Basophils Absolute: 8 cells/uL (ref 0–200)
Basophils Relative: 0.1 %
Eosinophils Absolute: 176 cells/uL (ref 15–500)
Eosinophils Relative: 2.1 %
HCT: 44 % (ref 38.5–50.0)
Hemoglobin: 14.9 g/dL (ref 13.2–17.1)
Lymphs Abs: 3553 cells/uL (ref 850–3900)
MCH: 31 pg (ref 27.0–33.0)
MCHC: 33.9 g/dL (ref 32.0–36.0)
MCV: 91.5 fL (ref 80.0–100.0)
MPV: 9 fL (ref 7.5–12.5)
Monocytes Relative: 10.3 %
Neutro Abs: 3797 cells/uL (ref 1500–7800)
Neutrophils Relative %: 45.2 %
Platelets: 310 10*3/uL (ref 140–400)
RBC: 4.81 10*6/uL (ref 4.20–5.80)
RDW: 13.8 % (ref 11.0–15.0)
Total Lymphocyte: 42.3 %
WBC: 8.4 10*3/uL (ref 3.8–10.8)

## 2018-07-24 LAB — COMPLETE METABOLIC PANEL WITH GFR
AG Ratio: 1.2 (calc) (ref 1.0–2.5)
ALT: 16 U/L (ref 9–46)
AST: 16 U/L (ref 10–35)
Albumin: 3.6 g/dL (ref 3.6–5.1)
Alkaline phosphatase (APISO): 86 U/L (ref 35–144)
BUN: 11 mg/dL (ref 7–25)
CO2: 28 mmol/L (ref 20–32)
Calcium: 8.8 mg/dL (ref 8.6–10.3)
Chloride: 105 mmol/L (ref 98–110)
Creat: 1.12 mg/dL (ref 0.70–1.33)
GFR, Est African American: 86 mL/min/{1.73_m2} (ref >=60)
GFR, Est Non African American: 74 mL/min/{1.73_m2} (ref >=60)
Globulin: 3.1 g/dL (calc) (ref 1.9–3.7)
Glucose, Bld: 106 mg/dL — ABNORMAL HIGH (ref 65–99)
Potassium: 4.7 mmol/L (ref 3.5–5.3)
Sodium: 139 mmol/L (ref 135–146)
Total Bilirubin: 0.5 mg/dL (ref 0.2–1.2)
Total Protein: 6.7 g/dL (ref 6.1–8.1)

## 2018-07-24 LAB — HIV-1 RNA QUANT-NO REFLEX-BLD
HIV 1 RNA Quant: 65 copies/mL — ABNORMAL HIGH
HIV-1 RNA Quant, Log: 1.81 Log copies/mL — ABNORMAL HIGH

## 2018-07-24 LAB — RPR: RPR Ser Ql: NONREACTIVE

## 2018-08-05 ENCOUNTER — Encounter: Payer: Medicare HMO | Admitting: Internal Medicine

## 2018-08-05 ENCOUNTER — Other Ambulatory Visit: Payer: Self-pay | Admitting: Internal Medicine

## 2018-08-05 DIAGNOSIS — B20 Human immunodeficiency virus [HIV] disease: Secondary | ICD-10-CM

## 2018-08-05 DIAGNOSIS — E291 Testicular hypofunction: Secondary | ICD-10-CM | POA: Diagnosis not present

## 2018-08-06 ENCOUNTER — Ambulatory Visit (INDEPENDENT_AMBULATORY_CARE_PROVIDER_SITE_OTHER): Payer: Medicare HMO | Admitting: Licensed Clinical Social Worker

## 2018-08-06 ENCOUNTER — Other Ambulatory Visit: Payer: Self-pay

## 2018-08-06 ENCOUNTER — Encounter: Payer: Self-pay | Admitting: Internal Medicine

## 2018-08-06 ENCOUNTER — Ambulatory Visit (INDEPENDENT_AMBULATORY_CARE_PROVIDER_SITE_OTHER): Payer: Medicare HMO | Admitting: Internal Medicine

## 2018-08-06 VITALS — BP 142/97 | HR 85 | Temp 99.5°F | Wt 397.0 lb

## 2018-08-06 DIAGNOSIS — F329 Major depressive disorder, single episode, unspecified: Secondary | ICD-10-CM

## 2018-08-06 DIAGNOSIS — B20 Human immunodeficiency virus [HIV] disease: Secondary | ICD-10-CM | POA: Diagnosis not present

## 2018-08-06 DIAGNOSIS — F331 Major depressive disorder, recurrent, moderate: Secondary | ICD-10-CM

## 2018-08-06 DIAGNOSIS — R4589 Other symptoms and signs involving emotional state: Secondary | ICD-10-CM | POA: Insufficient documentation

## 2018-08-06 DIAGNOSIS — Z113 Encounter for screening for infections with a predominantly sexual mode of transmission: Secondary | ICD-10-CM

## 2018-08-06 DIAGNOSIS — R69 Illness, unspecified: Secondary | ICD-10-CM | POA: Diagnosis not present

## 2018-08-06 NOTE — BH Specialist Note (Signed)
Integrated Behavioral Health Follow Up Visit  MRN: [id number] Name: Vincent Young  Session Start time:  9:03am  Session End time: 9:44am Total time: 40 minutes  Type of Service: [place] Interpretor:No. Interpretor Name and Language: n/a  SUBJECTIVE: Vincent Young is a 53 y.o. male accompanied by self Patient reports the following symptoms/concerns: anger, recent (but not current) homicidal thoughts, intrusive thoughts, depressed mood, difficulty concentrating, trouble sleeping  OBJECTIVE: Mood: Depressed and Affect: Labile Risk of harm to self or others: No plan to harm self or others  LIFE CONTEXT: Patient reports he and wife have decided to separate in June. 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    GOALS ADDRESSED: Patient will: 1.  Reduce symptoms of: depression   INTERVENTIONS: Interventions utilized: Supportive Counseling and Reality Therapy  ASSESSMENT: Patient currently experiencing depressed mood, irritability/anger, difficulty concentrating, insomnia and intrusive thoughts. He reports that a few weeks ago he was "homicidally enraged" at his wife's actions, but talked to mother and sister who helped to calm him. Counselor processed these thoughts with him. Patient reports he has not had any homicidal thoughts since then, though he is still very angry, and now sees that those thoughts were not good for him or his family.  Counselor guided patient to process the trigger for this rage: wife made the decision to separate and has been "living her life and finding herself" since then. Patient reports that after she went to visit an elderly friend for a weekend, he went through her bag and found lingerie, about which he confronted her and she never  answered. He states that he knows what she is doing, because it is the same type of sneakiness she used when she was cheating on ex-husband with him. He made several comments about knowing that she is the kind of person who would cheat because of this. Counselor challenged patient that she was not the only one involved in their secret relationship, and that although he was not married he does have culpability and could have chosen not to become involved with a married woman. Patient acknowledged this, then went on to say that what upsets him most is that wife is now wearing makeup, going out, all the things he had been asking her to do while they were together. He theorized that she is doing these things just to push his buttons. Counselor offered a different perspective; patient has said wife was "finding herself" and perhaps the time she asked for in the past allowed her to do so, but what she found out about herself made her see that they could not continue to be together. Patient stated that he had not thought about it from that perspective. He stated that he is hurt that wife has begun living her life while they are still together. Counselor confronted patient on this, pointing out that at one point he was doing the same thing - searching dating apps and making dates. Patient pointed out that he did not follow through with them because his spiritual center led him to see that keeping the family intact was a better choice. Counselor acknowledged this, and pointed out that it may be a fundamental impasse that patient's journey led him back to wife, while hers led her away from him. Counselor challenged patient  to see this as a choice point: he can choose whether to fight back and contest the divorce or whether to move on with his life.   Patient may benefit from Reality Therapy and/or CBT sessions 2-3 times per month.  PLAN: 1. Follow up with behavioral health clinician on : 08/13/18  Lillie Fragmin,  LCSW

## 2018-08-06 NOTE — Assessment & Plan Note (Signed)
Doing well on his salvage regimen.  No changes  rtc 6 months.

## 2018-08-06 NOTE — Assessment & Plan Note (Signed)
Screened negative 

## 2018-08-06 NOTE — Assessment & Plan Note (Signed)
Situational due to brother with recent CVA, divorce.  Getting counseling and in good spirits today.

## 2018-08-06 NOTE — Progress Notes (Signed)
   Subjective:    Patient ID: Vincent Young, male    DOB: 1966-02-09, 53 y.o.   MRN: [id number]  HPI Here for follow up of HIV He is on [place] and doing well with no missed doses.  He has a long history of poor compliance and has felt that taking sporadically accomplishes the same suppression as daily medication.  He was counseled against that and reports continued good compliance since.  No associated n/v/d with medication. CD4 is 640 and viral load 65 copies.    He is in process of divorce with his wife.  Has a good support system and is getting counseling with our counselor here.  Weight up to 297.  Wants to get down to 280.  Going to start back at the gym when able post COVID.     Review of Systems  Constitutional: Negative for fatigue.  Gastrointestinal: Negative for diarrhea and nausea.  Skin: Negative for rash.  Neurological: Negative for dizziness.       Objective:   Physical Exam  Constitutional: He appears well-developed and well-nourished. No distress.  HENT:  Mouth/Throat: No oropharyngeal exudate.  Eyes: No scleral icterus.  Cardiovascular: Normal rate, regular rhythm and normal heart sounds.  No murmur heard. Pulmonary/Chest: Effort normal and breath sounds normal. No respiratory distress.  Lymphadenopathy:    He has no cervical adenopathy.  Skin: No rash noted.   SH: no current tobacco       Assessment & Plan:

## 2018-08-07 ENCOUNTER — Telehealth: Payer: Self-pay | Admitting: Pharmacy Technician

## 2018-08-07 ENCOUNTER — Other Ambulatory Visit: Payer: Self-pay | Admitting: Pharmacist

## 2018-08-07 DIAGNOSIS — B20 Human immunodeficiency virus [HIV] disease: Secondary | ICD-10-CM

## 2018-08-07 MED ORDER — DOLUTEGRAVIR SODIUM 50 MG PO TABS: 50.0000 mg | ORAL_TABLET | Freq: Every day | ORAL | 5 refills | Status: DC

## 2018-08-07 MED ORDER — DARUN-COBIC-EMTRICIT-TENOFAF 800-150-200-10 MG PO TABS: 1.0000 | ORAL_TABLET | Freq: Every day | ORAL | 5 refills | Status: DC

## 2018-08-07 MED FILL — TIVICAY 50 MG TABLET: 50 | 30 days supply | Qty: 30 | Fill #0

## 2018-08-07 MED FILL — SYMTUZA 800-150-200-10 MG T: 800-150-200 | 30 days supply | Qty: 30 | Fill #5

## 2018-08-07 MED FILL — VALACYCLOVIR HCL 500 MG TAB: 500 | 30 days supply | Qty: 60 | Fill #5

## 2018-08-07 NOTE — Telephone Encounter (Signed)
[facility] Patient Advocate Encounter   I was successful in securing patient a $7200 grant from [employer] (PAF) to provide copayment coverage for [place] and [place].  This will make the out of pocket cost $0.     I have spoken with the patient.    The billing information is as follows and has been shared with [place].   RxBin: [id number] PCN: [id number] Member ID: [id number] Group ID: [id number] Dates of Eligibility: 08/07/2018 through 08/07/2019  Patient knows to call the office with questions or concerns.  Venida Jarvis. Nadara Mustard [place] Patient [facility] Phone: [phone] Fax:  [phone]

## 2018-08-07 NOTE — Progress Notes (Signed)
Refills to [facility].  

## 2018-08-13 ENCOUNTER — Other Ambulatory Visit: Payer: Self-pay

## 2018-08-13 ENCOUNTER — Ambulatory Visit (INDEPENDENT_AMBULATORY_CARE_PROVIDER_SITE_OTHER): Payer: Medicare HMO | Admitting: Licensed Clinical Social Worker

## 2018-08-13 DIAGNOSIS — F331 Major depressive disorder, recurrent, moderate: Secondary | ICD-10-CM

## 2018-08-13 DIAGNOSIS — R69 Illness, unspecified: Secondary | ICD-10-CM | POA: Diagnosis not present

## 2018-08-14 NOTE — BH Specialist Note (Signed)
Integrated Behavioral Health Follow Up Visit  MRN: [id number] Name: Kenaz Olafson   Session Start time: 9:10am  Session End time: 9:45am Total time: 35 minutes  Type of Service: [place] Interpretor:No. Interpretor Name and Language: n/a  SUBJECTIVE: Erian Rosengren is a 53 y.o. male accompanied by self Patient reports the following symptoms/concerns: anxiety about future, feelings of guilt, irritability, difficulty concentrating  OBJECTIVE: Mood: Euthymic and Affect: Blunt  Risk of harm to self or others: No plan to harm self or others  LIFE CONTEXT: Patient reports that he   GOALS ADDRESSED: Patient will: 1.  Reduce symptoms of: depression   INTERVENTIONS: Interventions utilized:  Solution-Focused Strategies and Brief CBT  ASSESSMENT: Patient currently experiencing guilty feelings, irritability, anxiety, and difficulty concentrating. He sometimes has depressed mood and sometimes struggles to sleep. Counselor and patient explored the state of the household and how this impacts his symptoms. Patient stated that his wife has been much more pleasant lately, and there has been more peace in the house She has not been going out or doing things "as if she were already single." Patient indicates that he has "come to accept" that they are separating, though he still has trouble with the idea of a divorce, due to his spiritual beliefs. Counselor processed this with him. Patient expressed that he and wife have both been unhappy for a while, and that he was upset before because it was her decision to leave rather than his. He states that now he feels okay about the separation, and is just nervous about what that means for his life going forward. Counselor explored with patient the genesis of his anxiety and irritability. Patient reports that he cannot decide what to do about the future. He shares that while the idea of saying goodbye to the home they  shared together causes him sadness and anxiety, so does the idea of starting over completely in a new place. Counselor discussed with patient ways to address this anxiety, and explored with him leaning on others who care about him.  Patient may benefit from continued session every week, via phone at this point due to [place]. This was discussed with patient and he agreed to telephonic sessions for now.  PLAN: 1. Follow up with behavioral health clinician on : 08/20/2018  Lillie Fragmin, LCSW

## 2018-08-20 ENCOUNTER — Other Ambulatory Visit: Payer: Self-pay

## 2018-08-20 ENCOUNTER — Ambulatory Visit (INDEPENDENT_AMBULATORY_CARE_PROVIDER_SITE_OTHER): Payer: Medicare HMO | Admitting: Licensed Clinical Social Worker

## 2018-08-20 DIAGNOSIS — F331 Major depressive disorder, recurrent, moderate: Secondary | ICD-10-CM | POA: Diagnosis not present

## 2018-08-20 DIAGNOSIS — R69 Illness, unspecified: Secondary | ICD-10-CM | POA: Diagnosis not present

## 2018-08-21 DIAGNOSIS — E291 Testicular hypofunction: Secondary | ICD-10-CM | POA: Diagnosis not present

## 2018-08-24 NOTE — BH Specialist Note (Signed)
Integrated Behavioral Health Visit via Telemedicine (Telephone)  08/24/2018 Vincent Young [id number]   Session Start time: 9:00 am Session End time: 9:30am  Total time: 30 minutes  Type of Visit: Telephonic Patient location: patient's home [facility] Provider location: [facility] All persons participating in visit: Counselor and Patient  Confirmed patient's address: No  Confirmed patient's phone number: Yes  Any changes to demographics: No   Confirmed patient's insurance: Yes  Any changes to patient's insurance: No   Discussed confidentiality: Yes    The following statements were read to the patient and/or legal guardian that are established with the [facility] Provider.  "The purpose of this phone visit is to provide behavioral health care while limiting exposure to the coronavirus (COVID19).  There is a possibility of technology failure and discussed alternative modes of communication if that failure occurs."  "By engaging in this telephone visit, you consent to the provision of healthcare.  Additionally, you authorize for your insurance to be billed for the services provided during this telephone visit."   Patient and/or legal guardian consented to telephone visit: Yes co  PRESENTING CONCERNS: Patient and/or family reports the following symptoms/concerns: irritability, difficulty concentrating, anxiety, grief over brother  STRENGTHS (Protective Factors/Coping Skills): Intelligent, resilient, supportive family  GOALS ADDRESSED: Patient will: 1.  Reduce symptoms of: depression   INTERVENTIONS: Interventions utilized:  Brief CBT and Supportive Counseling  ASSESSMENT: Patient currently experiencing irritability, difficulty concentrating, anxiety, grief (brother in coma and not expected to recover. He shared his frustrations about family members (his 75 year old son and wife's children) not respecting what he is going through or reacting in the way he expected.Counselor  processed this with patient. Patient shares that the children do not seem to care that their uncle is dying and that they do not appreciate the things they have. He talked about getting angry with 38 year old son over not putting his stuff away, stating that when he was a child he did not have all the things his son does, and he tries to tell him how lucky he is. Counselor explored with patient the concept of taking someone else's perspective. Counselor explained how his children and step children do not have a context for what it is like to be without the things they have, and patient's stories are not relatable because of this. Patient and counselor processed a past experience with step-daughter, who he believes should have been able to see the example he set and change the way she did things as a teenager. Counselor emphasized to patient that all the things he talks about are age appropriate for the children. Counselor challenged patient to consider the perspective of a tween or teen who is uncomfortable with death/dying and doesn't know what to say, so they avoid a dying family member to keep from feeling uncomfortable, rather than not caring about he person. Counselor pointed out that patient has a lot of expectations for others to think/feel/act just as he would and gets angry when this is not the case. Patient explored with counselor ways that he has had unrealistic expectations of others, then gotten angry when they weren't met the way he would have done. Counselor emphasized to patient that this is a means of control, and that others will respond to situations in their own ways. Counselor discussed with patient his responsibilities versus the responsibilities of others, and encouraged patient to focus on his emotional responses rather than trying to control the responses of those around him.  Patient may benefit from weekly CBT via phone .  PLAN: 1. Follow up with behavioral health clinician on :  08/27/18  Vincent Young

## 2018-08-27 ENCOUNTER — Ambulatory Visit: Payer: Medicare HMO | Admitting: Licensed Clinical Social Worker

## 2018-08-27 DIAGNOSIS — Z79899 Other long term (current) drug therapy: Secondary | ICD-10-CM | POA: Diagnosis not present

## 2018-08-27 DIAGNOSIS — Z6841 Body Mass Index (BMI) 40.0 and over, adult: Secondary | ICD-10-CM | POA: Diagnosis not present

## 2018-08-27 DIAGNOSIS — M170 Bilateral primary osteoarthritis of knee: Secondary | ICD-10-CM | POA: Diagnosis not present

## 2018-08-27 DIAGNOSIS — R69 Illness, unspecified: Secondary | ICD-10-CM | POA: Diagnosis not present

## 2018-08-27 DIAGNOSIS — G4733 Obstructive sleep apnea (adult) (pediatric): Secondary | ICD-10-CM | POA: Diagnosis not present

## 2018-08-27 DIAGNOSIS — E119 Type 2 diabetes mellitus without complications: Secondary | ICD-10-CM | POA: Diagnosis not present

## 2018-08-27 DIAGNOSIS — E7800 Pure hypercholesterolemia, unspecified: Secondary | ICD-10-CM | POA: Diagnosis not present

## 2018-08-27 DIAGNOSIS — E291 Testicular hypofunction: Secondary | ICD-10-CM | POA: Diagnosis not present

## 2018-08-27 DIAGNOSIS — I10 Essential (primary) hypertension: Secondary | ICD-10-CM | POA: Diagnosis not present

## 2018-09-02 ENCOUNTER — Other Ambulatory Visit: Payer: Self-pay

## 2018-09-02 ENCOUNTER — Encounter: Payer: Self-pay | Admitting: Podiatry

## 2018-09-02 ENCOUNTER — Ambulatory Visit: Payer: Medicare HMO | Admitting: Podiatry

## 2018-09-02 VITALS — Temp 97.5°F

## 2018-09-02 DIAGNOSIS — B351 Tinea unguium: Secondary | ICD-10-CM | POA: Diagnosis not present

## 2018-09-02 DIAGNOSIS — E119 Type 2 diabetes mellitus without complications: Secondary | ICD-10-CM

## 2018-09-02 NOTE — Progress Notes (Signed)
Complaint:  Visit Type: Patient returns to my office for continued preventative foot care services. Complaint: Patient states" my nails have grown long and thick and become painful to walk and wear shoes" Patient has been diagnosed with DM with neuropathy  The patient presents for preventative foot care services. No changes to ROS  Podiatric Exam: Vascular: dorsalis pedis and posterior tibial pulses are palpable bilateral. Capillary return is immediate. Temperature gradient is WNL. Skin turgor WNL  Sensorium: Normal Semmes Weinstein monofilament test. Normal tactile sensation bilaterally. Nail Exam: Pt has thick disfigured discolored nails with subungual debris noted bilateral entire nail hallux through fifth toenails Ulcer Exam: There is no evidence of ulcer or pre-ulcerative changes or infection. Orthopedic Exam: Muscle tone and strength are WNL. No limitations in general ROM. No crepitus or effusions noted. Foot type and digits show no abnormalities. Bony prominences are unremarkable. Skin: No Porokeratosis. No infection or ulcers  Diagnosis:  Onychomycosis, , Pain in right toe, pain in left toes  Treatment & Plan Procedures and Treatment: Consent by patient was obtained for treatment procedures.   Debridement of mycotic and hypertrophic toenails, 1 through 5 bilateral and clearing of subungual debris. No ulceration, no infection noted.  Return Visit-Office Procedure: Patient instructed to return to the office for a follow up visit 3 months for continued evaluation and treatment.    Gardiner Barefoot DPM

## 2018-09-04 ENCOUNTER — Other Ambulatory Visit: Payer: Self-pay | Admitting: Internal Medicine

## 2018-09-04 DIAGNOSIS — B20 Human immunodeficiency virus [HIV] disease: Secondary | ICD-10-CM

## 2018-09-04 MED FILL — VALACYCLOVIR HCL 500 MG TAB: 500 | 30 days supply | Qty: 60 | Fill #0

## 2018-09-04 MED FILL — TIVICAY 50 MG TABLET: 50 | 30 days supply | Qty: 30 | Fill #1

## 2018-09-04 MED FILL — SYMTUZA 800-150-200-10 MG T: 800-150-200 | 30 days supply | Qty: 30 | Fill #0

## 2018-09-14 DIAGNOSIS — E291 Testicular hypofunction: Secondary | ICD-10-CM | POA: Diagnosis not present

## 2018-10-01 MED FILL — TIVICAY 50 MG TABLET: 50 | 30 days supply | Qty: 30 | Fill #2

## 2018-10-01 MED FILL — SYMTUZA 800-150-200-10 MG T: 800-150-200 | 30 days supply | Qty: 30 | Fill #1

## 2018-10-01 MED FILL — VALACYCLOVIR HCL 500 MG TAB: 500 | 30 days supply | Qty: 60 | Fill #1

## 2018-10-05 DIAGNOSIS — E291 Testicular hypofunction: Secondary | ICD-10-CM | POA: Diagnosis not present

## 2018-10-27 DIAGNOSIS — R05 Cough: Secondary | ICD-10-CM | POA: Diagnosis not present

## 2018-10-27 DIAGNOSIS — R69 Illness, unspecified: Secondary | ICD-10-CM | POA: Diagnosis not present

## 2018-10-27 DIAGNOSIS — E119 Type 2 diabetes mellitus without complications: Secondary | ICD-10-CM | POA: Diagnosis not present

## 2018-10-27 DIAGNOSIS — Z9189 Other specified personal risk factors, not elsewhere classified: Secondary | ICD-10-CM | POA: Diagnosis not present

## 2018-10-28 DIAGNOSIS — E291 Testicular hypofunction: Secondary | ICD-10-CM | POA: Diagnosis not present

## 2018-10-30 DIAGNOSIS — Z9189 Other specified personal risk factors, not elsewhere classified: Secondary | ICD-10-CM | POA: Diagnosis not present

## 2018-11-02 DIAGNOSIS — E291 Testicular hypofunction: Secondary | ICD-10-CM | POA: Diagnosis not present

## 2018-11-12 MED FILL — TIVICAY 50 MG TABLET: 50 | 30 days supply | Qty: 30 | Fill #3

## 2018-11-12 MED FILL — SYMTUZA 800-150-200-10 MG T: 800-150-200 | 30 days supply | Qty: 30 | Fill #2

## 2018-11-12 MED FILL — VALACYCLOVIR HCL 500 MG TAB: 500 | 30 days supply | Qty: 60 | Fill #2

## 2018-11-16 DIAGNOSIS — R69 Illness, unspecified: Secondary | ICD-10-CM | POA: Diagnosis not present

## 2018-11-16 DIAGNOSIS — G4733 Obstructive sleep apnea (adult) (pediatric): Secondary | ICD-10-CM | POA: Diagnosis not present

## 2018-11-16 DIAGNOSIS — I10 Essential (primary) hypertension: Secondary | ICD-10-CM | POA: Diagnosis not present

## 2018-11-16 DIAGNOSIS — R062 Wheezing: Secondary | ICD-10-CM | POA: Diagnosis not present

## 2018-11-16 DIAGNOSIS — R05 Cough: Secondary | ICD-10-CM | POA: Diagnosis not present

## 2018-11-16 DIAGNOSIS — E291 Testicular hypofunction: Secondary | ICD-10-CM | POA: Diagnosis not present

## 2018-11-16 DIAGNOSIS — B009 Herpesviral infection, unspecified: Secondary | ICD-10-CM | POA: Diagnosis not present

## 2018-11-17 DIAGNOSIS — I10 Essential (primary) hypertension: Secondary | ICD-10-CM | POA: Diagnosis not present

## 2018-11-17 DIAGNOSIS — G4733 Obstructive sleep apnea (adult) (pediatric): Secondary | ICD-10-CM | POA: Diagnosis not present

## 2018-11-17 DIAGNOSIS — E119 Type 2 diabetes mellitus without complications: Secondary | ICD-10-CM | POA: Diagnosis not present

## 2018-11-17 DIAGNOSIS — J4541 Moderate persistent asthma with (acute) exacerbation: Secondary | ICD-10-CM | POA: Diagnosis not present

## 2018-11-17 DIAGNOSIS — E291 Testicular hypofunction: Secondary | ICD-10-CM | POA: Diagnosis not present

## 2018-11-17 DIAGNOSIS — R69 Illness, unspecified: Secondary | ICD-10-CM | POA: Diagnosis not present

## 2018-11-17 DIAGNOSIS — M170 Bilateral primary osteoarthritis of knee: Secondary | ICD-10-CM | POA: Diagnosis not present

## 2018-11-17 DIAGNOSIS — R0989 Other specified symptoms and signs involving the circulatory and respiratory systems: Secondary | ICD-10-CM | POA: Diagnosis not present

## 2018-11-17 DIAGNOSIS — R062 Wheezing: Secondary | ICD-10-CM | POA: Diagnosis not present

## 2018-11-24 DIAGNOSIS — E291 Testicular hypofunction: Secondary | ICD-10-CM | POA: Diagnosis not present

## 2018-12-02 ENCOUNTER — Ambulatory Visit: Payer: Medicare HMO | Admitting: Podiatry

## 2018-12-04 DIAGNOSIS — J4541 Moderate persistent asthma with (acute) exacerbation: Secondary | ICD-10-CM | POA: Diagnosis not present

## 2018-12-04 DIAGNOSIS — E119 Type 2 diabetes mellitus without complications: Secondary | ICD-10-CM | POA: Diagnosis not present

## 2018-12-04 DIAGNOSIS — I10 Essential (primary) hypertension: Secondary | ICD-10-CM | POA: Diagnosis not present

## 2018-12-04 DIAGNOSIS — M170 Bilateral primary osteoarthritis of knee: Secondary | ICD-10-CM | POA: Diagnosis not present

## 2018-12-08 DIAGNOSIS — E291 Testicular hypofunction: Secondary | ICD-10-CM | POA: Diagnosis not present

## 2018-12-10 ENCOUNTER — Telehealth: Payer: Self-pay | Admitting: Pharmacist

## 2018-12-10 NOTE — Telephone Encounter (Signed)
[name] called patient for a refill since the pharmacy could not get in touch with him.  He stated that he hasn't taken his ART in 2 weeks because "everything going on in the world". This is typical of Vincent Young and he never takes his ART as prescribed.

## 2018-12-10 NOTE — Telephone Encounter (Signed)
Right.  Just another excuse he gives to not take it.   thanks

## 2018-12-21 MED FILL — TIVICAY 50 MG TABLET: 50 | 30 days supply | Qty: 30 | Fill #4

## 2018-12-21 MED FILL — VALACYCLOVIR HCL 500 MG TAB: 500 | 30 days supply | Qty: 60 | Fill #3

## 2018-12-21 MED FILL — SYMTUZA 800-150-200-10 MG T: 800-150-200 | 30 days supply | Qty: 30 | Fill #3

## 2018-12-22 DIAGNOSIS — E291 Testicular hypofunction: Secondary | ICD-10-CM | POA: Diagnosis not present

## 2019-01-04 DIAGNOSIS — E291 Testicular hypofunction: Secondary | ICD-10-CM | POA: Diagnosis not present

## 2019-01-15 DIAGNOSIS — I10 Essential (primary) hypertension: Secondary | ICD-10-CM | POA: Diagnosis not present

## 2019-01-15 DIAGNOSIS — R05 Cough: Secondary | ICD-10-CM | POA: Diagnosis not present

## 2019-01-15 DIAGNOSIS — R809 Proteinuria, unspecified: Secondary | ICD-10-CM | POA: Diagnosis not present

## 2019-01-15 DIAGNOSIS — J4541 Moderate persistent asthma with (acute) exacerbation: Secondary | ICD-10-CM | POA: Diagnosis not present

## 2019-01-15 DIAGNOSIS — E1169 Type 2 diabetes mellitus with other specified complication: Secondary | ICD-10-CM | POA: Diagnosis not present

## 2019-01-15 DIAGNOSIS — M170 Bilateral primary osteoarthritis of knee: Secondary | ICD-10-CM | POA: Diagnosis not present

## 2019-01-18 DIAGNOSIS — E291 Testicular hypofunction: Secondary | ICD-10-CM | POA: Diagnosis not present

## 2019-01-21 DIAGNOSIS — E1169 Type 2 diabetes mellitus with other specified complication: Secondary | ICD-10-CM | POA: Diagnosis not present

## 2019-02-03 DIAGNOSIS — E291 Testicular hypofunction: Secondary | ICD-10-CM | POA: Diagnosis not present

## 2019-02-08 ENCOUNTER — Other Ambulatory Visit: Payer: Medicare HMO

## 2019-02-11 DIAGNOSIS — M25551 Pain in right hip: Secondary | ICD-10-CM | POA: Diagnosis not present

## 2019-02-11 DIAGNOSIS — M25512 Pain in left shoulder: Secondary | ICD-10-CM | POA: Diagnosis not present

## 2019-02-16 DIAGNOSIS — E291 Testicular hypofunction: Secondary | ICD-10-CM | POA: Diagnosis not present

## 2019-02-22 ENCOUNTER — Encounter: Payer: Medicare HMO | Admitting: Internal Medicine

## 2019-02-23 ENCOUNTER — Other Ambulatory Visit: Payer: Self-pay

## 2019-02-23 ENCOUNTER — Other Ambulatory Visit: Payer: Medicare HMO

## 2019-02-23 DIAGNOSIS — B20 Human immunodeficiency virus [HIV] disease: Secondary | ICD-10-CM

## 2019-02-23 DIAGNOSIS — R69 Illness, unspecified: Secondary | ICD-10-CM | POA: Diagnosis not present

## 2019-02-24 LAB — T-HELPER CELL (CD4) - (RCID CLINIC ONLY)
CD4 % Helper T Cell: 22 % — ABNORMAL LOW (ref 33–65)
CD4 T Cell Abs: 789 /uL (ref 400–1790)

## 2019-02-26 LAB — HIV-1 RNA QUANT-NO REFLEX-BLD
HIV 1 RNA Quant: 132 copies/mL — ABNORMAL HIGH
HIV-1 RNA Quant, Log: 2.12 Log copies/mL — ABNORMAL HIGH

## 2019-03-03 DIAGNOSIS — E291 Testicular hypofunction: Secondary | ICD-10-CM | POA: Diagnosis not present

## 2019-03-09 ENCOUNTER — Encounter: Payer: Medicare HMO | Admitting: Internal Medicine

## 2019-03-09 ENCOUNTER — Telehealth: Payer: Self-pay

## 2019-03-09 DIAGNOSIS — L03019 Cellulitis of unspecified finger: Secondary | ICD-10-CM | POA: Diagnosis not present

## 2019-03-09 NOTE — Telephone Encounter (Signed)
Called patient to reschedule missed appointment with Dr. Linus Salmons.Patient was able to take my call and agreed to reschedule appointment. Will come in on 10/21 to see MD; patient understands he must call office if he is unable to make it. [place]

## 2019-03-10 ENCOUNTER — Encounter: Payer: Self-pay | Admitting: Internal Medicine

## 2019-03-10 ENCOUNTER — Ambulatory Visit (INDEPENDENT_AMBULATORY_CARE_PROVIDER_SITE_OTHER): Payer: Medicare HMO | Admitting: Internal Medicine

## 2019-03-10 ENCOUNTER — Other Ambulatory Visit: Payer: Self-pay

## 2019-03-10 VITALS — BP 128/76 | HR 88 | Temp 98.6°F | Wt >= 6400 oz

## 2019-03-10 DIAGNOSIS — E669 Obesity, unspecified: Secondary | ICD-10-CM | POA: Diagnosis not present

## 2019-03-10 DIAGNOSIS — B20 Human immunodeficiency virus [HIV] disease: Secondary | ICD-10-CM

## 2019-03-10 DIAGNOSIS — Z113 Encounter for screening for infections with a predominantly sexual mode of transmission: Secondary | ICD-10-CM

## 2019-03-10 DIAGNOSIS — Z79899 Other long term (current) drug therapy: Secondary | ICD-10-CM | POA: Diagnosis not present

## 2019-03-10 DIAGNOSIS — R69 Illness, unspecified: Secondary | ICD-10-CM | POA: Diagnosis not present

## 2019-03-10 NOTE — Assessment & Plan Note (Signed)
Doing relatively well but I suspect he starts taking the medication prior to the appointment as he sporadically fills the prescription.  He was interested in the U=U and this was discussed including the need to stay on medication.   rtc 6 months.

## 2019-03-10 NOTE — Assessment & Plan Note (Signed)
encouraged weight loss and he is starting an exercise program.

## 2019-03-10 NOTE — Progress Notes (Signed)
   Subjective:    Patient ID: Vincent Young, male    DOB: 05-10-1966, 53 y.o.   MRN: [id number]  HPI Here for follow up of HIV He is on [place] and endorses some missed doses.  CD4 good at 789 and viral load 132.  He has not filled his medications since August and has a history of sporadic compliance. He has gained some weight and interested in trying to lose weight.  Also taking care of his brother who had a stroke and at a NH, though can't see him in person.  Recently divorced from his wife and now in a better place from that.  Refuses the flu shot due to belief in conspiracy theories.    Review of Systems  Constitutional: Negative for fatigue.  Gastrointestinal: Negative for diarrhea and nausea.  Psychiatric/Behavioral: Negative for sleep disturbance and suicidal ideas.       Objective:   Physical Exam Constitutional:      Appearance: Normal appearance.  Eyes:     General: No scleral icterus. Cardiovascular:     Rate and Rhythm: Normal rate and regular rhythm.     Heart sounds: No murmur.  Pulmonary:     Effort: Pulmonary effort is normal.     Breath sounds: Normal breath sounds.  Skin:    Findings: No rash.  Neurological:     Mental Status: He is alert.  Psychiatric:        Mood and Affect: Mood normal.           Assessment & Plan:

## 2019-03-15 MED FILL — TIVICAY 50 MG TABLET: 50 | 30 days supply | Qty: 30 | Fill #5

## 2019-03-15 MED FILL — SYMTUZA 800-150-200-10 MG T: 800-150-200 | 30 days supply | Qty: 30 | Fill #4

## 2019-03-18 DIAGNOSIS — E291 Testicular hypofunction: Secondary | ICD-10-CM | POA: Diagnosis not present

## 2019-03-24 DIAGNOSIS — M7552 Bursitis of left shoulder: Secondary | ICD-10-CM | POA: Diagnosis not present

## 2019-03-24 DIAGNOSIS — M25512 Pain in left shoulder: Secondary | ICD-10-CM | POA: Diagnosis not present

## 2019-03-24 DIAGNOSIS — M25551 Pain in right hip: Secondary | ICD-10-CM | POA: Diagnosis not present

## 2019-03-24 DIAGNOSIS — M25552 Pain in left hip: Secondary | ICD-10-CM | POA: Diagnosis not present

## 2019-03-26 DIAGNOSIS — R0789 Other chest pain: Secondary | ICD-10-CM | POA: Diagnosis not present

## 2019-03-26 DIAGNOSIS — R519 Headache, unspecified: Secondary | ICD-10-CM | POA: Diagnosis not present

## 2019-03-30 DIAGNOSIS — M25552 Pain in left hip: Secondary | ICD-10-CM | POA: Diagnosis not present

## 2019-03-30 DIAGNOSIS — M25551 Pain in right hip: Secondary | ICD-10-CM | POA: Diagnosis not present

## 2019-03-31 DIAGNOSIS — E1169 Type 2 diabetes mellitus with other specified complication: Secondary | ICD-10-CM | POA: Diagnosis not present

## 2019-03-31 DIAGNOSIS — I10 Essential (primary) hypertension: Secondary | ICD-10-CM | POA: Diagnosis not present

## 2019-03-31 DIAGNOSIS — E7800 Pure hypercholesterolemia, unspecified: Secondary | ICD-10-CM | POA: Diagnosis not present

## 2019-03-31 DIAGNOSIS — M170 Bilateral primary osteoarthritis of knee: Secondary | ICD-10-CM | POA: Diagnosis not present

## 2019-03-31 DIAGNOSIS — E119 Type 2 diabetes mellitus without complications: Secondary | ICD-10-CM | POA: Diagnosis not present

## 2019-03-31 DIAGNOSIS — J4541 Moderate persistent asthma with (acute) exacerbation: Secondary | ICD-10-CM | POA: Diagnosis not present

## 2019-04-02 DIAGNOSIS — E291 Testicular hypofunction: Secondary | ICD-10-CM | POA: Diagnosis not present

## 2019-04-06 ENCOUNTER — Other Ambulatory Visit: Payer: Self-pay | Admitting: Pharmacist

## 2019-04-06 DIAGNOSIS — B20 Human immunodeficiency virus [HIV] disease: Secondary | ICD-10-CM

## 2019-04-06 MED ORDER — SYMTUZA 800-150-200-10 MG PO TABS: 1.0000 | ORAL_TABLET | Freq: Every day | ORAL | 5 refills | Status: DC

## 2019-04-08 MED FILL — TIVICAY 50 MG TABLET: 50 | 30 days supply | Qty: 30 | Fill #0

## 2019-04-08 MED FILL — VALACYCLOVIR HCL 500 MG TAB: 500 | 30 days supply | Qty: 60 | Fill #4

## 2019-04-08 MED FILL — SYMTUZA 800-150-200-10 MG T: 800-150-200 | 30 days supply | Qty: 30 | Fill #5

## 2019-04-09 DIAGNOSIS — M25552 Pain in left hip: Secondary | ICD-10-CM | POA: Diagnosis not present

## 2019-04-09 DIAGNOSIS — M25551 Pain in right hip: Secondary | ICD-10-CM | POA: Diagnosis not present

## 2019-04-19 ENCOUNTER — Other Ambulatory Visit: Payer: Self-pay | Admitting: Family Medicine

## 2019-04-19 DIAGNOSIS — E291 Testicular hypofunction: Secondary | ICD-10-CM | POA: Diagnosis not present

## 2019-04-19 DIAGNOSIS — M1612 Unilateral primary osteoarthritis, left hip: Secondary | ICD-10-CM | POA: Diagnosis not present

## 2019-04-19 DIAGNOSIS — R7989 Other specified abnormal findings of blood chemistry: Secondary | ICD-10-CM | POA: Diagnosis not present

## 2019-04-19 DIAGNOSIS — Z79899 Other long term (current) drug therapy: Secondary | ICD-10-CM | POA: Diagnosis not present

## 2019-04-19 DIAGNOSIS — E119 Type 2 diabetes mellitus without complications: Secondary | ICD-10-CM | POA: Diagnosis not present

## 2019-04-28 DIAGNOSIS — M1612 Unilateral primary osteoarthritis, left hip: Secondary | ICD-10-CM | POA: Diagnosis not present

## 2019-04-30 DIAGNOSIS — R1013 Epigastric pain: Secondary | ICD-10-CM | POA: Diagnosis not present

## 2019-05-06 DIAGNOSIS — E291 Testicular hypofunction: Secondary | ICD-10-CM | POA: Diagnosis not present

## 2019-05-17 MED FILL — VALACYCLOVIR HCL 500 MG TAB: 500 | 30 days supply | Qty: 60 | Fill #5

## 2019-05-24 DIAGNOSIS — E291 Testicular hypofunction: Secondary | ICD-10-CM | POA: Diagnosis not present

## 2019-05-31 ENCOUNTER — Other Ambulatory Visit: Payer: Self-pay | Admitting: Internal Medicine

## 2019-05-31 DIAGNOSIS — B20 Human immunodeficiency virus [HIV] disease: Secondary | ICD-10-CM

## 2019-06-01 MED FILL — SYMTUZA 800-150-200-10 MG T: 800-150-200 | 30 days supply | Qty: 30 | Fill #0

## 2019-06-01 MED FILL — TIVICAY 50 MG TABLET: 50 | 30 days supply | Qty: 30 | Fill #1

## 2019-06-09 MED FILL — VALACYCLOVIR HCL 500 MG TAB: 500 | 30 days supply | Qty: 60 | Fill #0

## 2019-06-10 DIAGNOSIS — E291 Testicular hypofunction: Secondary | ICD-10-CM | POA: Diagnosis not present

## 2019-06-14 DIAGNOSIS — E7800 Pure hypercholesterolemia, unspecified: Secondary | ICD-10-CM | POA: Diagnosis not present

## 2019-06-14 DIAGNOSIS — I10 Essential (primary) hypertension: Secondary | ICD-10-CM | POA: Diagnosis not present

## 2019-06-14 DIAGNOSIS — M170 Bilateral primary osteoarthritis of knee: Secondary | ICD-10-CM | POA: Diagnosis not present

## 2019-06-14 DIAGNOSIS — E119 Type 2 diabetes mellitus without complications: Secondary | ICD-10-CM | POA: Diagnosis not present

## 2019-06-14 DIAGNOSIS — E1169 Type 2 diabetes mellitus with other specified complication: Secondary | ICD-10-CM | POA: Diagnosis not present

## 2019-06-14 DIAGNOSIS — J4541 Moderate persistent asthma with (acute) exacerbation: Secondary | ICD-10-CM | POA: Diagnosis not present

## 2019-06-23 DIAGNOSIS — M25562 Pain in left knee: Secondary | ICD-10-CM | POA: Diagnosis not present

## 2019-06-23 DIAGNOSIS — R269 Unspecified abnormalities of gait and mobility: Secondary | ICD-10-CM | POA: Diagnosis not present

## 2019-06-23 DIAGNOSIS — M170 Bilateral primary osteoarthritis of knee: Secondary | ICD-10-CM | POA: Diagnosis not present

## 2019-06-23 DIAGNOSIS — Z0189 Encounter for other specified special examinations: Secondary | ICD-10-CM | POA: Diagnosis not present

## 2019-06-23 DIAGNOSIS — M25361 Other instability, right knee: Secondary | ICD-10-CM | POA: Diagnosis not present

## 2019-06-23 DIAGNOSIS — M21161 Varus deformity, not elsewhere classified, right knee: Secondary | ICD-10-CM | POA: Diagnosis not present

## 2019-06-23 DIAGNOSIS — Z789 Other specified health status: Secondary | ICD-10-CM | POA: Diagnosis not present

## 2019-06-23 DIAGNOSIS — Z79899 Other long term (current) drug therapy: Secondary | ICD-10-CM | POA: Diagnosis not present

## 2019-06-23 DIAGNOSIS — M21162 Varus deformity, not elsewhere classified, left knee: Secondary | ICD-10-CM | POA: Diagnosis not present

## 2019-06-23 DIAGNOSIS — M25561 Pain in right knee: Secondary | ICD-10-CM | POA: Diagnosis not present

## 2019-06-25 DIAGNOSIS — E291 Testicular hypofunction: Secondary | ICD-10-CM | POA: Diagnosis not present

## 2019-07-01 ENCOUNTER — Other Ambulatory Visit: Payer: Self-pay | Admitting: Family Medicine

## 2019-07-01 DIAGNOSIS — E291 Testicular hypofunction: Secondary | ICD-10-CM

## 2019-07-01 DIAGNOSIS — M1990 Unspecified osteoarthritis, unspecified site: Secondary | ICD-10-CM | POA: Diagnosis not present

## 2019-07-01 DIAGNOSIS — K219 Gastro-esophageal reflux disease without esophagitis: Secondary | ICD-10-CM | POA: Diagnosis not present

## 2019-07-01 DIAGNOSIS — R69 Illness, unspecified: Secondary | ICD-10-CM | POA: Diagnosis not present

## 2019-07-01 DIAGNOSIS — E119 Type 2 diabetes mellitus without complications: Secondary | ICD-10-CM | POA: Diagnosis not present

## 2019-07-01 DIAGNOSIS — N529 Male erectile dysfunction, unspecified: Secondary | ICD-10-CM | POA: Diagnosis not present

## 2019-07-01 DIAGNOSIS — M1711 Unilateral primary osteoarthritis, right knee: Secondary | ICD-10-CM | POA: Diagnosis not present

## 2019-07-01 DIAGNOSIS — M8580 Other specified disorders of bone density and structure, unspecified site: Secondary | ICD-10-CM

## 2019-07-01 DIAGNOSIS — M25561 Pain in right knee: Secondary | ICD-10-CM | POA: Diagnosis not present

## 2019-07-01 DIAGNOSIS — I10 Essential (primary) hypertension: Secondary | ICD-10-CM | POA: Diagnosis not present

## 2019-07-01 DIAGNOSIS — J45909 Unspecified asthma, uncomplicated: Secondary | ICD-10-CM | POA: Diagnosis not present

## 2019-07-01 DIAGNOSIS — Z20822 Contact with and (suspected) exposure to covid-19: Secondary | ICD-10-CM | POA: Diagnosis not present

## 2019-07-01 DIAGNOSIS — E782 Mixed hyperlipidemia: Secondary | ICD-10-CM | POA: Diagnosis not present

## 2019-07-05 ENCOUNTER — Ambulatory Visit
Admission: RE | Admit: 2019-07-05 | Discharge: 2019-07-05 | Disposition: A | Discharge status: Home / Self Care | Payer: Medicare HMO | Source: Ambulatory Visit | Attending: Family Medicine | Admitting: Family Medicine

## 2019-07-05 ENCOUNTER — Other Ambulatory Visit: Payer: Self-pay

## 2019-07-05 DIAGNOSIS — M8580 Other specified disorders of bone density and structure, unspecified site: Secondary | ICD-10-CM

## 2019-07-05 DIAGNOSIS — E291 Testicular hypofunction: Secondary | ICD-10-CM

## 2019-07-06 MED FILL — TIVICAY 50 MG TABLET: 50 | 30 days supply | Qty: 30 | Fill #2

## 2019-07-06 MED FILL — SYMTUZA 800-150-200-10 MG T: 800-150-200 | 30 days supply | Qty: 30 | Fill #1

## 2019-07-06 MED FILL — VALACYCLOVIR HCL 500 MG TAB: 500 | 30 days supply | Qty: 60 | Fill #1

## 2019-07-08 DIAGNOSIS — J4541 Moderate persistent asthma with (acute) exacerbation: Secondary | ICD-10-CM | POA: Diagnosis not present

## 2019-07-08 DIAGNOSIS — M170 Bilateral primary osteoarthritis of knee: Secondary | ICD-10-CM | POA: Diagnosis not present

## 2019-07-08 DIAGNOSIS — E1169 Type 2 diabetes mellitus with other specified complication: Secondary | ICD-10-CM | POA: Diagnosis not present

## 2019-07-08 DIAGNOSIS — E7800 Pure hypercholesterolemia, unspecified: Secondary | ICD-10-CM | POA: Diagnosis not present

## 2019-07-08 DIAGNOSIS — I10 Essential (primary) hypertension: Secondary | ICD-10-CM | POA: Diagnosis not present

## 2019-07-08 DIAGNOSIS — E119 Type 2 diabetes mellitus without complications: Secondary | ICD-10-CM | POA: Diagnosis not present

## 2019-07-08 DIAGNOSIS — Z7984 Long term (current) use of oral hypoglycemic drugs: Secondary | ICD-10-CM | POA: Diagnosis not present

## 2019-07-09 DIAGNOSIS — E291 Testicular hypofunction: Secondary | ICD-10-CM | POA: Diagnosis not present

## 2019-07-15 DIAGNOSIS — M25562 Pain in left knee: Secondary | ICD-10-CM | POA: Diagnosis not present

## 2019-07-15 DIAGNOSIS — M170 Bilateral primary osteoarthritis of knee: Secondary | ICD-10-CM | POA: Diagnosis not present

## 2019-07-18 DIAGNOSIS — M170 Bilateral primary osteoarthritis of knee: Secondary | ICD-10-CM | POA: Diagnosis not present

## 2019-07-18 DIAGNOSIS — J4541 Moderate persistent asthma with (acute) exacerbation: Secondary | ICD-10-CM | POA: Diagnosis not present

## 2019-07-18 DIAGNOSIS — E1169 Type 2 diabetes mellitus with other specified complication: Secondary | ICD-10-CM | POA: Diagnosis not present

## 2019-07-18 DIAGNOSIS — I10 Essential (primary) hypertension: Secondary | ICD-10-CM | POA: Diagnosis not present

## 2019-07-18 DIAGNOSIS — E119 Type 2 diabetes mellitus without complications: Secondary | ICD-10-CM | POA: Diagnosis not present

## 2019-07-18 DIAGNOSIS — E7800 Pure hypercholesterolemia, unspecified: Secondary | ICD-10-CM | POA: Diagnosis not present

## 2019-07-18 DIAGNOSIS — Z7984 Long term (current) use of oral hypoglycemic drugs: Secondary | ICD-10-CM | POA: Diagnosis not present

## 2019-07-21 DIAGNOSIS — M25561 Pain in right knee: Secondary | ICD-10-CM | POA: Diagnosis not present

## 2019-07-21 DIAGNOSIS — M170 Bilateral primary osteoarthritis of knee: Secondary | ICD-10-CM | POA: Diagnosis not present

## 2019-07-26 DIAGNOSIS — E291 Testicular hypofunction: Secondary | ICD-10-CM | POA: Diagnosis not present

## 2019-07-29 DIAGNOSIS — M25562 Pain in left knee: Secondary | ICD-10-CM | POA: Diagnosis not present

## 2019-07-29 DIAGNOSIS — M25561 Pain in right knee: Secondary | ICD-10-CM | POA: Diagnosis not present

## 2019-07-29 DIAGNOSIS — M170 Bilateral primary osteoarthritis of knee: Secondary | ICD-10-CM | POA: Diagnosis not present

## 2019-08-05 DIAGNOSIS — M1712 Unilateral primary osteoarthritis, left knee: Secondary | ICD-10-CM | POA: Diagnosis not present

## 2019-08-05 DIAGNOSIS — M25562 Pain in left knee: Secondary | ICD-10-CM | POA: Diagnosis not present

## 2019-08-05 MED FILL — TIVICAY 50 MG TABLET: 50 | 30 days supply | Qty: 30 | Fill #3

## 2019-08-05 MED FILL — VALACYCLOVIR HCL 500 MG TAB: 500 | 30 days supply | Qty: 60 | Fill #2

## 2019-08-05 MED FILL — SYMTUZA 800-150-200-10 MG T: 800-150-200 | 30 days supply | Qty: 30 | Fill #2

## 2019-08-10 DIAGNOSIS — E291 Testicular hypofunction: Secondary | ICD-10-CM | POA: Diagnosis not present

## 2019-08-11 DIAGNOSIS — M25562 Pain in left knee: Secondary | ICD-10-CM | POA: Diagnosis not present

## 2019-08-11 DIAGNOSIS — M19012 Primary osteoarthritis, left shoulder: Secondary | ICD-10-CM | POA: Diagnosis not present

## 2019-08-11 DIAGNOSIS — M170 Bilateral primary osteoarthritis of knee: Secondary | ICD-10-CM | POA: Diagnosis not present

## 2019-08-11 DIAGNOSIS — M25512 Pain in left shoulder: Secondary | ICD-10-CM | POA: Diagnosis not present

## 2019-08-18 DIAGNOSIS — M25561 Pain in right knee: Secondary | ICD-10-CM | POA: Diagnosis not present

## 2019-08-18 DIAGNOSIS — R269 Unspecified abnormalities of gait and mobility: Secondary | ICD-10-CM | POA: Diagnosis not present

## 2019-08-18 DIAGNOSIS — M1712 Unilateral primary osteoarthritis, left knee: Secondary | ICD-10-CM | POA: Diagnosis not present

## 2019-08-18 DIAGNOSIS — M25562 Pain in left knee: Secondary | ICD-10-CM | POA: Diagnosis not present

## 2019-08-18 DIAGNOSIS — M170 Bilateral primary osteoarthritis of knee: Secondary | ICD-10-CM | POA: Diagnosis not present

## 2019-08-23 DIAGNOSIS — E1169 Type 2 diabetes mellitus with other specified complication: Secondary | ICD-10-CM | POA: Diagnosis not present

## 2019-08-23 DIAGNOSIS — E7800 Pure hypercholesterolemia, unspecified: Secondary | ICD-10-CM | POA: Diagnosis not present

## 2019-08-23 DIAGNOSIS — Z5181 Encounter for therapeutic drug level monitoring: Secondary | ICD-10-CM | POA: Diagnosis not present

## 2019-08-23 DIAGNOSIS — I10 Essential (primary) hypertension: Secondary | ICD-10-CM | POA: Diagnosis not present

## 2019-08-23 DIAGNOSIS — Z1211 Encounter for screening for malignant neoplasm of colon: Secondary | ICD-10-CM | POA: Diagnosis not present

## 2019-08-23 DIAGNOSIS — E291 Testicular hypofunction: Secondary | ICD-10-CM | POA: Diagnosis not present

## 2019-08-23 DIAGNOSIS — M170 Bilateral primary osteoarthritis of knee: Secondary | ICD-10-CM | POA: Diagnosis not present

## 2019-08-24 DIAGNOSIS — Z79899 Other long term (current) drug therapy: Secondary | ICD-10-CM | POA: Diagnosis not present

## 2019-08-24 DIAGNOSIS — Z5181 Encounter for therapeutic drug level monitoring: Secondary | ICD-10-CM | POA: Diagnosis not present

## 2019-08-24 DIAGNOSIS — E1169 Type 2 diabetes mellitus with other specified complication: Secondary | ICD-10-CM | POA: Diagnosis not present

## 2019-08-24 DIAGNOSIS — E291 Testicular hypofunction: Secondary | ICD-10-CM | POA: Diagnosis not present

## 2019-08-24 DIAGNOSIS — I10 Essential (primary) hypertension: Secondary | ICD-10-CM | POA: Diagnosis not present

## 2019-08-24 DIAGNOSIS — M170 Bilateral primary osteoarthritis of knee: Secondary | ICD-10-CM | POA: Diagnosis not present

## 2019-08-24 DIAGNOSIS — E7800 Pure hypercholesterolemia, unspecified: Secondary | ICD-10-CM | POA: Diagnosis not present

## 2019-09-08 ENCOUNTER — Other Ambulatory Visit: Payer: Self-pay

## 2019-09-08 ENCOUNTER — Other Ambulatory Visit (HOSPITAL_COMMUNITY)
Admission: RE | Admit: 2019-09-08 | Discharge: 2019-09-08 | Disposition: A | Discharge status: Home / Self Care | Payer: Medicare HMO | Source: Ambulatory Visit | Attending: Internal Medicine | Admitting: Internal Medicine

## 2019-09-08 ENCOUNTER — Ambulatory Visit: Payer: Medicare HMO | Admitting: Orthopaedic Surgery

## 2019-09-08 ENCOUNTER — Other Ambulatory Visit: Payer: Medicare HMO

## 2019-09-08 DIAGNOSIS — Z113 Encounter for screening for infections with a predominantly sexual mode of transmission: Secondary | ICD-10-CM | POA: Diagnosis not present

## 2019-09-08 DIAGNOSIS — R69 Illness, unspecified: Secondary | ICD-10-CM | POA: Diagnosis not present

## 2019-09-08 DIAGNOSIS — Z79899 Other long term (current) drug therapy: Secondary | ICD-10-CM

## 2019-09-08 DIAGNOSIS — B20 Human immunodeficiency virus [HIV] disease: Secondary | ICD-10-CM | POA: Insufficient documentation

## 2019-09-09 LAB — URINE CYTOLOGY ANCILLARY ONLY
Chlamydia: NEGATIVE
Comment: NEGATIVE
Comment: NORMAL
Neisseria Gonorrhea: NEGATIVE

## 2019-09-09 LAB — T-HELPER CELL (CD4) - (RCID CLINIC ONLY)
CD4 % Helper T Cell: 19 % — ABNORMAL LOW (ref 33–65)
CD4 T Cell Abs: 562 /uL (ref 400–1790)

## 2019-09-10 LAB — CBC WITH DIFFERENTIAL/PLATELET
Absolute Monocytes: 590 cells/uL (ref 200–950)
Basophils Absolute: 18 cells/uL (ref 0–200)
Basophils Relative: 0.2 %
Eosinophils Absolute: 141 cells/uL (ref 15–500)
Eosinophils Relative: 1.6 %
HCT: 45.7 % (ref 38.5–50.0)
Hemoglobin: 15.7 g/dL (ref 13.2–17.1)
Lymphs Abs: 3142 cells/uL (ref 850–3900)
MCH: 32.6 pg (ref 27.0–33.0)
MCHC: 34.4 g/dL (ref 32.0–36.0)
MCV: 95 fL (ref 80.0–100.0)
MPV: 9.2 fL (ref 7.5–12.5)
Monocytes Relative: 6.7 %
Neutro Abs: 4910 cells/uL (ref 1500–7800)
Neutrophils Relative %: 55.8 %
Platelets: 256 10*3/uL (ref 140–400)
RBC: 4.81 10*6/uL (ref 4.20–5.80)
RDW: 13.6 % (ref 11.0–15.0)
Total Lymphocyte: 35.7 %
WBC: 8.8 10*3/uL (ref 3.8–10.8)

## 2019-09-10 LAB — COMPLETE METABOLIC PANEL WITH GFR
AG Ratio: 1.4 (calc) (ref 1.0–2.5)
ALT: 21 U/L (ref 9–46)
AST: 17 U/L (ref 10–35)
Albumin: 4 g/dL (ref 3.6–5.1)
Alkaline phosphatase (APISO): 83 U/L (ref 35–144)
BUN: 14 mg/dL (ref 7–25)
CO2: 27 mmol/L (ref 20–32)
Calcium: 9.2 mg/dL (ref 8.6–10.3)
Chloride: 103 mmol/L (ref 98–110)
Creat: 1.26 mg/dL (ref 0.70–1.33)
GFR, Est African American: 73 mL/min/{1.73_m2} (ref >=60)
GFR, Est Non African American: 63 mL/min/{1.73_m2} (ref >=60)
Globulin: 2.8 g/dL (calc) (ref 1.9–3.7)
Glucose, Bld: 137 mg/dL — ABNORMAL HIGH (ref 65–99)
Potassium: 4.4 mmol/L (ref 3.5–5.3)
Sodium: 141 mmol/L (ref 135–146)
Total Bilirubin: 0.6 mg/dL (ref 0.2–1.2)
Total Protein: 6.8 g/dL (ref 6.1–8.1)

## 2019-09-10 LAB — LIPID PANEL
Cholesterol: 133 mg/dL
HDL: 48 mg/dL
LDL Cholesterol (Calc): 70 mg/dL
Non-HDL Cholesterol (Calc): 85 mg/dL
Total CHOL/HDL Ratio: 2.8 (calc)
Triglycerides: 69 mg/dL

## 2019-09-10 LAB — HIV-1 RNA QUANT-NO REFLEX-BLD
HIV 1 RNA Quant: 27 copies/mL — ABNORMAL HIGH
HIV-1 RNA Quant, Log: 1.43 Log copies/mL — ABNORMAL HIGH

## 2019-09-10 LAB — SYPHILIS: RPR W/REFLEX TO RPR TITER AND TREPONEMAL ANTIBODIES, TRADITIONAL SCREENING AND DIAGNOSIS ALGORITHM: RPR Ser Ql: NONREACTIVE

## 2019-09-13 ENCOUNTER — Telehealth: Payer: Self-pay | Admitting: Pharmacy Technician

## 2019-09-13 MED FILL — TIVICAY 50 MG TABLET: 50 | 30 days supply | Qty: 30 | Fill #4

## 2019-09-13 MED FILL — VALACYCLOVIR HCL 500 MG TAB: 500 | 30 days supply | Qty: 60 | Fill #3

## 2019-09-13 MED FILL — SYMTUZA 800-150-200-10 MG T: 800-150-200 | 30 days supply | Qty: 30 | Fill #3

## 2019-09-13 NOTE — Telephone Encounter (Signed)
[facility] Patient Advocate Encounter   I was successful in securing patient an $44 grant from Patient [employer] [facility]) to provide copayment coverage for [employer] and [place].  This will make the out of pocket cost $0.    The billing information is as follows and has been shared with [facility].   Member ID: [id number] Group ID: [id number] RxBin: [id number] Dates of Eligibility: 09/13/2019 through 09/11/2020  Patient knows to call the office with questions or concerns.  Vincent Young. Nadara Mustard [place] Patient [facility]ious [facility] Phone: [phone] Fax:  [phone]

## 2019-09-14 DIAGNOSIS — E291 Testicular hypofunction: Secondary | ICD-10-CM | POA: Diagnosis not present

## 2019-09-22 ENCOUNTER — Encounter: Payer: Self-pay | Admitting: Internal Medicine

## 2019-09-22 ENCOUNTER — Telehealth: Payer: Self-pay | Admitting: Pharmacist

## 2019-09-22 ENCOUNTER — Ambulatory Visit (INDEPENDENT_AMBULATORY_CARE_PROVIDER_SITE_OTHER): Payer: Medicare HMO | Admitting: Internal Medicine

## 2019-09-22 ENCOUNTER — Other Ambulatory Visit: Payer: Self-pay

## 2019-09-22 VITALS — BP 141/91 | HR 84 | Wt 398.0 lb

## 2019-09-22 DIAGNOSIS — B20 Human immunodeficiency virus [HIV] disease: Secondary | ICD-10-CM | POA: Diagnosis not present

## 2019-09-22 DIAGNOSIS — R69 Illness, unspecified: Secondary | ICD-10-CM | POA: Diagnosis not present

## 2019-09-22 DIAGNOSIS — Z5181 Encounter for therapeutic drug level monitoring: Secondary | ICD-10-CM

## 2019-09-22 DIAGNOSIS — Z113 Encounter for screening for infections with a predominantly sexual mode of transmission: Secondary | ICD-10-CM | POA: Diagnosis not present

## 2019-09-22 NOTE — Telephone Encounter (Signed)
Faxed patient's [place] enrollment form to [facility] today. They will assess patient's insurance and send a benefits investigation form detailing how to start the injections (where to get it from and cost). Once benefits investigation is complete, we will order oral lead-in therapy and start patient on treatment. Will update encounter with further details when available. 

## 2019-09-22 NOTE — Progress Notes (Signed)
   Subjective:    Patient ID: Vincent Young, male    DOB: 08/30/1965, 54 y.o.   MRN: [id number]  HPI Here for follow up of HIV He is on Symtuza and Tivicay and endorses only about 5 missed doses in 6 months.   CD4 good at 562, viral load 27. He is interested in a one pill a day option or even injectables if possible.  His brother remains in a NH after his stroke and doing relatively well.   He is getting the COVID vaccine and due for #2 on the 15th.     Review of Systems  Constitutional: Negative for fatigue.  Gastrointestinal: Negative for diarrhea and nausea.       Objective:   Physical Exam Constitutional:      Appearance: Normal appearance.  Eyes:     General: No scleral icterus. Pulmonary:     Effort: Pulmonary effort is normal.  Skin:    Findings: No rash.  Neurological:     Mental Status: He is alert.  Psychiatric:        Mood and Affect: Mood normal.   SH: no tobacco       Assessment & Plan:

## 2019-09-22 NOTE — Assessment & Plan Note (Signed)
Creat, LFTs ok 

## 2019-09-22 NOTE — Assessment & Plan Note (Signed)
Screened negative 

## 2019-09-22 NOTE — Assessment & Plan Note (Signed)
He is doing well on his current regimen.  Unfortunately, with his mutation history, I don't think he will be eligible for the injectables but probably can change him to [place].  Will discuss with pharmacy.  rtc 6 months.

## 2019-09-29 DIAGNOSIS — E291 Testicular hypofunction: Secondary | ICD-10-CM | POA: Diagnosis not present

## 2019-10-06 ENCOUNTER — Telehealth: Payer: Self-pay

## 2019-10-06 ENCOUNTER — Other Ambulatory Visit: Payer: Self-pay | Admitting: Pharmacist

## 2019-10-06 DIAGNOSIS — B20 Human immunodeficiency virus [HIV] disease: Secondary | ICD-10-CM

## 2019-10-06 MED ORDER — BIKTARVY 50-200-25 MG PO TABS: 1.0000 | ORAL_TABLET | Freq: Every day | ORAL | 5 refills | Status: DC

## 2019-10-06 NOTE — Telephone Encounter (Signed)
Patient called triage and requested to be switched to [facility] from his current regimen per his previous discussion with Dr. Linus Salmons. He also requested to be considered for the injectables; per his chart it looks like Pharmacy is working on [facility], clinical. Will route to pharmacy for follow up.  Dr. Linus Salmons, would you write a prescription for [name] if ok?  Thanks!  Vincent Young Lorita Officer, RN

## 2019-10-06 NOTE — Telephone Encounter (Signed)
Vincent Young is fine per my discussion with Dr.Comer. I will send in the prescription.  I submitted his application for injectable but have not heard back from [facility] yet (this takes weeks) and we are still figuring out things on the clinic end. It may be awhile.

## 2019-10-07 NOTE — Telephone Encounter (Signed)
Yes, thanks

## 2019-10-11 MED FILL — BIKTARVY 50-200-25 MG TABS: 50-200-25 | 30 days supply | Qty: 30 | Fill #0

## 2019-10-13 DIAGNOSIS — E291 Testicular hypofunction: Secondary | ICD-10-CM | POA: Diagnosis not present

## 2019-10-15 ENCOUNTER — Other Ambulatory Visit: Payer: Self-pay | Admitting: Gastroenterology

## 2019-10-19 DIAGNOSIS — M170 Bilateral primary osteoarthritis of knee: Secondary | ICD-10-CM | POA: Diagnosis not present

## 2019-10-19 DIAGNOSIS — I10 Essential (primary) hypertension: Secondary | ICD-10-CM | POA: Diagnosis not present

## 2019-10-19 DIAGNOSIS — E119 Type 2 diabetes mellitus without complications: Secondary | ICD-10-CM | POA: Diagnosis not present

## 2019-10-19 DIAGNOSIS — J4541 Moderate persistent asthma with (acute) exacerbation: Secondary | ICD-10-CM | POA: Diagnosis not present

## 2019-10-19 DIAGNOSIS — E1169 Type 2 diabetes mellitus with other specified complication: Secondary | ICD-10-CM | POA: Diagnosis not present

## 2019-10-19 DIAGNOSIS — E7800 Pure hypercholesterolemia, unspecified: Secondary | ICD-10-CM | POA: Diagnosis not present

## 2019-11-02 DIAGNOSIS — E291 Testicular hypofunction: Secondary | ICD-10-CM | POA: Diagnosis not present

## 2019-11-08 MED FILL — BIKTARVY 50-200-25 MG TABS: 50-200-25 | 30 days supply | Qty: 30 | Fill #1

## 2019-11-12 ENCOUNTER — Other Ambulatory Visit (HOSPITAL_COMMUNITY)
Admission: RE | Admit: 2019-11-12 | Discharge: 2019-11-12 | Disposition: A | Discharge status: Home / Self Care | Payer: Medicare HMO | Source: Ambulatory Visit | Attending: Gastroenterology | Admitting: Gastroenterology

## 2019-11-12 DIAGNOSIS — Z01812 Encounter for preprocedural laboratory examination: Secondary | ICD-10-CM | POA: Diagnosis not present

## 2019-11-12 DIAGNOSIS — Z20822 Contact with and (suspected) exposure to covid-19: Secondary | ICD-10-CM | POA: Insufficient documentation

## 2019-11-12 LAB — SARS CORONAVIRUS 2 (TAT 6-24 HRS): SARS Coronavirus 2: NEGATIVE

## 2019-11-15 NOTE — Progress Notes (Signed)
Pre call done. Pts COVID test negative. Instructed patient to be at hospital by 1130 for procedure start time at 1300. Patient has a ride to pick them up. Instructed to be NPO past midnight.

## 2019-11-16 ENCOUNTER — Other Ambulatory Visit: Payer: Self-pay

## 2019-11-16 ENCOUNTER — Ambulatory Visit (HOSPITAL_COMMUNITY)
Admission: RE | Admit: 2019-11-16 | Discharge: 2019-11-16 | Disposition: A | Discharge status: Home / Self Care | Payer: Medicare HMO | Source: Ambulatory Visit | Attending: Gastroenterology | Admitting: Gastroenterology

## 2019-11-16 ENCOUNTER — Encounter (HOSPITAL_COMMUNITY)
Admission: RE | Disposition: A | Discharge status: Home / Self Care | Payer: Self-pay | Source: Ambulatory Visit | Attending: Gastroenterology

## 2019-11-16 ENCOUNTER — Ambulatory Visit (HOSPITAL_COMMUNITY): Payer: Medicare HMO | Admitting: Anesthesiology

## 2019-11-16 ENCOUNTER — Encounter (HOSPITAL_COMMUNITY): Payer: Self-pay | Admitting: Gastroenterology

## 2019-11-16 DIAGNOSIS — Z79899 Other long term (current) drug therapy: Secondary | ICD-10-CM | POA: Insufficient documentation

## 2019-11-16 DIAGNOSIS — Z1211 Encounter for screening for malignant neoplasm of colon: Secondary | ICD-10-CM | POA: Diagnosis not present

## 2019-11-16 DIAGNOSIS — Z86010 Personal history of colonic polyps: Secondary | ICD-10-CM | POA: Diagnosis not present

## 2019-11-16 DIAGNOSIS — D123 Benign neoplasm of transverse colon: Secondary | ICD-10-CM | POA: Diagnosis not present

## 2019-11-16 DIAGNOSIS — J45909 Unspecified asthma, uncomplicated: Secondary | ICD-10-CM | POA: Insufficient documentation

## 2019-11-16 DIAGNOSIS — G4733 Obstructive sleep apnea (adult) (pediatric): Secondary | ICD-10-CM | POA: Diagnosis not present

## 2019-11-16 DIAGNOSIS — Z794 Long term (current) use of insulin: Secondary | ICD-10-CM | POA: Diagnosis not present

## 2019-11-16 DIAGNOSIS — R69 Illness, unspecified: Secondary | ICD-10-CM | POA: Diagnosis not present

## 2019-11-16 DIAGNOSIS — Z21 Asymptomatic human immunodeficiency virus [HIV] infection status: Secondary | ICD-10-CM | POA: Insufficient documentation

## 2019-11-16 DIAGNOSIS — K5730 Diverticulosis of large intestine without perforation or abscess without bleeding: Secondary | ICD-10-CM | POA: Insufficient documentation

## 2019-11-16 DIAGNOSIS — Z8249 Family history of ischemic heart disease and other diseases of the circulatory system: Secondary | ICD-10-CM | POA: Diagnosis not present

## 2019-11-16 DIAGNOSIS — E119 Type 2 diabetes mellitus without complications: Secondary | ICD-10-CM | POA: Insufficient documentation

## 2019-11-16 DIAGNOSIS — Z880 Allergy status to penicillin: Secondary | ICD-10-CM | POA: Diagnosis not present

## 2019-11-16 DIAGNOSIS — Z833 Family history of diabetes mellitus: Secondary | ICD-10-CM | POA: Diagnosis not present

## 2019-11-16 DIAGNOSIS — Z8371 Family history of colonic polyps: Secondary | ICD-10-CM | POA: Diagnosis not present

## 2019-11-16 DIAGNOSIS — Z87891 Personal history of nicotine dependence: Secondary | ICD-10-CM | POA: Insufficient documentation

## 2019-11-16 DIAGNOSIS — K635 Polyp of colon: Secondary | ICD-10-CM | POA: Diagnosis not present

## 2019-11-16 HISTORY — PX: POLYPECTOMY: SHX5525

## 2019-11-16 HISTORY — PX: COLONOSCOPY WITH PROPOFOL: SHX5780

## 2019-11-16 LAB — GLUCOSE, CAPILLARY: Glucose-Capillary: 93 mg/dL (ref 70–99)

## 2019-11-16 SURGERY — COLONOSCOPY WITH PROPOFOL
Anesthesia: Monitor Anesthesia Care

## 2019-11-16 MED ORDER — PROPOFOL 500 MG/50ML IV EMUL
INTRAVENOUS | Status: DC | PRN
  Administered 2019-11-16: 40 mg via INTRAVENOUS

## 2019-11-16 MED ORDER — PROPOFOL 500 MG/50ML IV EMUL
INTRAVENOUS | Status: AC
  Filled 2019-11-16: qty 50

## 2019-11-16 MED ORDER — LACTATED RINGERS IV SOLN
INTRAVENOUS | Status: DC
  Administered 2019-11-16: 1000 mL via INTRAVENOUS

## 2019-11-16 MED ORDER — SODIUM CHLORIDE 0.9 % IV SOLN: INTRAVENOUS | Status: DC

## 2019-11-16 MED ORDER — PROPOFOL 500 MG/50ML IV EMUL
INTRAVENOUS | Status: DC | PRN
  Administered 2019-11-16: 150 ug/kg/min via INTRAVENOUS

## 2019-11-16 SURGICAL SUPPLY — 21 items

## 2019-11-16 NOTE — Discharge Instructions (Signed)
YOU HAD AN ENDOSCOPIC PROCEDURE TODAY: Refer to the procedure report and other information in the discharge instructions given to you for any specific questions about what was found during the examination. If this information does not answer your questions, please call [facility] office at [phone] to clarify.   YOU SHOULD EXPECT: Some feelings of bloating in the abdomen. Passage of more gas than usual. Walking can help get rid of the air that was put into your GI tract during the procedure and reduce the bloating. If you had a lower endoscopy (such as a colonoscopy or flexible sigmoidoscopy) you may notice spotting of blood in your stool or on the toilet paper. Some abdominal soreness may be present for a day or two, also.  DIET: Your first meal following the procedure should be a light meal and then it is ok to progress to your normal diet. A half-sandwich or bowl of soup is an example of a good first meal. Heavy or fried foods are harder to digest and may make you feel nauseous or bloated. Drink plenty of fluids but you should avoid alcoholic beverages for 24 hours. If you had a esophageal dilation, please see attached instructions for diet.    ACTIVITY: Your care partner should take you home directly after the procedure. You should plan to take it easy, moving slowly for the rest of the day. You can resume normal activity the day after the procedure however YOU SHOULD NOT DRIVE, use power tools, machinery or perform tasks that involve climbing or major physical exertion for 24 hours (because of the sedation medicines used during the test).   SYMPTOMS TO REPORT IMMEDIATELY: A gastroenterologist can be reached at any hour. Please call [phone]  for any of the following symptoms:  . Following lower endoscopy (colonoscopy, flexible sigmoidoscopy) Excessive amounts of blood in the stool  Significant tenderness, worsening of abdominal pains  Swelling of the abdomen that is new, acute  Fever of 100  or higher  . Following upper endoscopy (EGD, EUS, ERCP, esophageal dilation) Vomiting of blood or coffee ground material  New, significant abdominal pain  New, significant chest pain or pain under the shoulder blades  Painful or persistently difficult swallowing  New shortness of breath  Black, tarry-looking or red, bloody stools  FOLLOW UP:  If any biopsies were taken you will be contacted by phone or by letter within the next 1-3 weeks. Call [phone]  if you have not heard about the biopsies in 3 weeks.  Please also call with any specific questions about appointments or follow up tests. Begin with soft solid diet and slowly advance as tolerated and call if any GI question or problem otherwise call in 1 week for pathology results probably repeat colonoscopy in 5 years

## 2019-11-16 NOTE — Anesthesia Postprocedure Evaluation (Signed)
Anesthesia Post Note  Patient: Tron Flythe  Procedure(s) Performed: COLONOSCOPY WITH PROPOFOL (N/A ) POLYPECTOMY     Patient location during evaluation: Endoscopy Anesthesia Type: MAC Level of consciousness: awake Pain management: pain level controlled Vital Signs Assessment: post-procedure vital signs reviewed and stable Respiratory status: spontaneous breathing, nonlabored ventilation, respiratory function stable and patient connected to nasal cannula oxygen Cardiovascular status: stable and blood pressure returned to baseline Postop Assessment: no apparent nausea or vomiting Anesthetic complications: no   No complications documented.  Last Vitals:  Vitals:   11/16/19 1410 11/16/19 1420  BP: (!) 165/94 (!) 152/88  Pulse: 86 86  Resp: (!) 22 16  Temp:    SpO2: 94% 96%    Last Pain:  Vitals:   11/16/19 1420  TempSrc:   PainSc: 0-No pain                 Dylana Shaw P Brea Coleson

## 2019-11-16 NOTE — Transfer of Care (Signed)
Immediate Anesthesia Transfer of Care Note  Patient: Vincent Young  Procedure(s) Performed: COLONOSCOPY WITH PROPOFOL (N/A ) POLYPECTOMY  Patient Location: PACU  Anesthesia Type:MAC  Level of Consciousness: drowsy, patient cooperative and responds to stimulation  Airway & Oxygen Therapy: Patient Spontanous Breathing and Patient connected to face mask oxygen  Post-op Assessment: Report given to RN and Post -op Vital signs reviewed and stable  Post vital signs: Reviewed and stable  Last Vitals:  Vitals Value Taken Time  BP    Temp    Pulse    Resp    SpO2      Last Pain:  Vitals:   11/16/19 1232  TempSrc: Oral  PainSc: 0-No pain         Complications: No complications documented.

## 2019-11-16 NOTE — Progress Notes (Signed)
Vincent Young 1:13 PM  Subjective: Patient without any GI complaints and no new medical problems since we talked to him recently and we reviewed his previous procedure  Objective: Vital signs stable afebrile exam please see preassessment evaluation  Assessment: Personal history of colon polyps family history of colon polyps due for colon screening  Plan: Okay to proceed with colonoscopy with anesthesia assistance  [name]  office [phone] After 5PM or if no answer call [phone]

## 2019-11-16 NOTE — Op Note (Signed)
[facility] Patient Name: Vincent Young Procedure Date: 11/16/2019 MRN: [id number] Attending MD: Clarene Essex , MD Date of Birth: Jan 22, 1966 CSN: [id number] Age: 54 Admit Type: Inpatient Procedure:                Colonoscopy Indications:              High risk colon cancer surveillance: Personal                            history of colonic polyps, Last colonoscopy greater                            than 5 years ago, Family history of colonic polyps                            in a first-degree relative Providers:                Clarene Essex, MD, Cleda Daub, RN, Cherylynn Ridges,                            Technician, Rosario Adie, CRNA Referring MD:              Medicines:                Propofol total dose 009 mg IV Complications:            No immediate complications. Estimated Blood Loss:     Estimated blood loss: none. Procedure:                Pre-Anesthesia Assessment:                           - Prior to the procedure, a History and Physical                            was performed, and patient medications and                            allergies were reviewed. The patient's tolerance of                            previous anesthesia was also reviewed. The risks                            and benefits of the procedure and the sedation                            options and risks were discussed with the patient.                            All questions were answered, and informed consent                            was obtained. Prior Anticoagulants: The patient has  taken no previous anticoagulant or antiplatelet                            agents. ASA Grade Assessment: III - A patient with                            severe systemic disease. After reviewing the risks                            and benefits, the patient was deemed in                            satisfactory condition to undergo the procedure.                            After obtaining informed consent, the colonoscope                            was passed under direct vision. Throughout the                            procedure, the patient's blood pressure, pulse, and                            oxygen saturations were monitored continuously. The                            CF-HQ190L ([id number]) Olympus colonoscope was                            introduced through the anus and advanced to the the                            cecum, identified by appendiceal orifice and                            ileocecal valve. The ileocecal valve, appendiceal                            orifice, and rectum were photographed. The                            colonoscopy was performed without difficulty. The                            patient tolerated the procedure well. The quality                            of the bowel preparation was adequate. Scope In: Scope Out: 1:47:02 PM Scope Withdrawal Time: 0 hours 17 minutes 24 seconds  Findings:      A few small-mouthed diverticula were found in the sigmoid colon.      A small polyp was found in the distal transverse colon. The polyp was  semi-sessile. The polyp was removed with a cold snare. Resection and       retrieval were complete.      The exam was otherwise without abnormality. Impression:               - Diverticulosis in the sigmoid colon.                           - One small polyp in the distal transverse colon,                            removed with a cold snare. Resected and retrieved.                           - The examination was otherwise normal. Moderate Sedation:      Not Applicable - Patient had care per Anesthesia. Recommendation:           - Patient has a contact number available for                            emergencies. The signs and symptoms of potential                            delayed complications were discussed with the                            patient. Return to normal activities tomorrow.                             Written discharge instructions were provided to the                            patient.                           - Soft diet today.                           - Continue present medications.                           - Await pathology results.                           - Repeat colonoscopy in 5 years for surveillance                            based on pathology results.                           - Return to GI office PRN.                           - Telephone GI clinic for pathology results in 1  week.                           - Telephone GI clinic if symptomatic PRN. Procedure Code(s):        --- Professional ---                           [phone], Colonoscopy, flexible; with removal of                            tumor(s), polyp(s), or other lesion(s) by snare                            technique Diagnosis Code(s):        --- Professional ---                           Z86.010, Personal history of colonic polyps                           K63.5, Polyp of colon                           Z83.71, Family history of colonic polyps                           K57.30, Diverticulosis of large intestine without                            perforation or abscess without bleeding CPT copyright 2019 American Medical Association. All rights reserved. The codes documented in this report are preliminary and upon coder review may  be revised to meet current compliance requirements. Clarene Essex, MD 11/16/2019 1:54:31 PM This report has been signed electronically. Number of Addenda: 0

## 2019-11-16 NOTE — Anesthesia Preprocedure Evaluation (Addendum)
Anesthesia Evaluation  Patient identified by MRN, date of birth, ID band Patient awake    Reviewed: Allergy & Precautions, NPO status , Patient's Chart, lab work & pertinent test results  Airway Mallampati: II  TM Distance: >3 FB Neck ROM: Full    Dental no notable dental hx.    Pulmonary neg pulmonary ROS, former smoker,    Pulmonary exam normal breath sounds clear to auscultation       Cardiovascular hypertension, Normal cardiovascular exam Rhythm:Regular Rate:Normal     Neuro/Psych negative neurological ROS  negative psych ROS   GI/Hepatic negative GI ROS, Neg liver ROS,   Endo/Other  diabetesMorbid obesity  Renal/GU negative Renal ROS  negative genitourinary   Musculoskeletal negative musculoskeletal ROS (+)   Abdominal   Peds negative pediatric ROS (+)  Hematology  (+) HIV,   Anesthesia Other Findings   Reproductive/Obstetrics negative OB ROS                             Anesthesia Physical Anesthesia Plan  ASA: III  Anesthesia Plan: MAC   Post-op Pain Management:    Induction: Intravenous  PONV Risk Score and Plan: 0  Airway Management Planned: Simple Face Mask  Additional Equipment:   Intra-op Plan:   Post-operative Plan:   Informed Consent: I have reviewed the patients History and Physical, chart, labs and discussed the procedure including the risks, benefits and alternatives for the proposed anesthesia with the patient or authorized representative who has indicated his/her understanding and acceptance.     Dental advisory given  Plan Discussed with: CRNA and Surgeon  Anesthesia Plan Comments:         Anesthesia Quick Evaluation

## 2019-11-17 ENCOUNTER — Encounter (HOSPITAL_COMMUNITY): Payer: Self-pay | Admitting: Gastroenterology

## 2019-11-18 LAB — SURGICAL PATHOLOGY

## 2019-11-23 DIAGNOSIS — R0789 Other chest pain: Secondary | ICD-10-CM | POA: Diagnosis not present

## 2019-11-23 DIAGNOSIS — E7800 Pure hypercholesterolemia, unspecified: Secondary | ICD-10-CM | POA: Diagnosis not present

## 2019-11-23 DIAGNOSIS — G4733 Obstructive sleep apnea (adult) (pediatric): Secondary | ICD-10-CM | POA: Diagnosis not present

## 2019-11-23 DIAGNOSIS — E1169 Type 2 diabetes mellitus with other specified complication: Secondary | ICD-10-CM | POA: Diagnosis not present

## 2019-11-23 DIAGNOSIS — N529 Male erectile dysfunction, unspecified: Secondary | ICD-10-CM | POA: Diagnosis not present

## 2019-11-23 DIAGNOSIS — R809 Proteinuria, unspecified: Secondary | ICD-10-CM | POA: Diagnosis not present

## 2019-11-23 DIAGNOSIS — I10 Essential (primary) hypertension: Secondary | ICD-10-CM | POA: Diagnosis not present

## 2019-11-23 DIAGNOSIS — Z5181 Encounter for therapeutic drug level monitoring: Secondary | ICD-10-CM | POA: Diagnosis not present

## 2019-11-23 DIAGNOSIS — E291 Testicular hypofunction: Secondary | ICD-10-CM | POA: Diagnosis not present

## 2019-11-23 DIAGNOSIS — R69 Illness, unspecified: Secondary | ICD-10-CM | POA: Diagnosis not present

## 2019-11-23 DIAGNOSIS — Z79899 Other long term (current) drug therapy: Secondary | ICD-10-CM | POA: Diagnosis not present

## 2019-11-24 ENCOUNTER — Telehealth: Payer: Self-pay

## 2019-11-24 NOTE — Telephone Encounter (Signed)
NOTES ON FILE FROM [facility] AT [place], REFERRAL IN PROFICIENT

## 2019-11-29 MED FILL — VALACYCLOVIR HCL 500 MG TAB: 500 | 30 days supply | Qty: 60 | Fill #4

## 2019-12-06 MED FILL — BIKTARVY 50-200-25 MG TABS: 50-200-25 | 30 days supply | Qty: 30 | Fill #2

## 2019-12-09 DIAGNOSIS — E7800 Pure hypercholesterolemia, unspecified: Secondary | ICD-10-CM | POA: Diagnosis not present

## 2019-12-09 DIAGNOSIS — I10 Essential (primary) hypertension: Secondary | ICD-10-CM | POA: Diagnosis not present

## 2019-12-09 DIAGNOSIS — M170 Bilateral primary osteoarthritis of knee: Secondary | ICD-10-CM | POA: Diagnosis not present

## 2019-12-09 DIAGNOSIS — E119 Type 2 diabetes mellitus without complications: Secondary | ICD-10-CM | POA: Diagnosis not present

## 2019-12-09 DIAGNOSIS — J4541 Moderate persistent asthma with (acute) exacerbation: Secondary | ICD-10-CM | POA: Diagnosis not present

## 2019-12-09 DIAGNOSIS — E1169 Type 2 diabetes mellitus with other specified complication: Secondary | ICD-10-CM | POA: Diagnosis not present

## 2019-12-13 DIAGNOSIS — E291 Testicular hypofunction: Secondary | ICD-10-CM | POA: Diagnosis not present

## 2019-12-15 DIAGNOSIS — M25562 Pain in left knee: Secondary | ICD-10-CM | POA: Diagnosis not present

## 2019-12-15 DIAGNOSIS — M1711 Unilateral primary osteoarthritis, right knee: Secondary | ICD-10-CM | POA: Diagnosis not present

## 2019-12-15 DIAGNOSIS — M1712 Unilateral primary osteoarthritis, left knee: Secondary | ICD-10-CM | POA: Diagnosis not present

## 2019-12-15 DIAGNOSIS — M25561 Pain in right knee: Secondary | ICD-10-CM | POA: Diagnosis not present

## 2019-12-15 DIAGNOSIS — Z6841 Body Mass Index (BMI) 40.0 and over, adult: Secondary | ICD-10-CM | POA: Diagnosis not present

## 2019-12-15 DIAGNOSIS — M170 Bilateral primary osteoarthritis of knee: Secondary | ICD-10-CM | POA: Diagnosis not present

## 2019-12-16 DIAGNOSIS — R079 Chest pain, unspecified: Secondary | ICD-10-CM | POA: Diagnosis not present

## 2019-12-16 DIAGNOSIS — R0789 Other chest pain: Secondary | ICD-10-CM | POA: Diagnosis not present

## 2019-12-16 DIAGNOSIS — E860 Dehydration: Secondary | ICD-10-CM | POA: Diagnosis not present

## 2019-12-16 DIAGNOSIS — R0602 Shortness of breath: Secondary | ICD-10-CM | POA: Diagnosis not present

## 2019-12-16 DIAGNOSIS — R52 Pain, unspecified: Secondary | ICD-10-CM | POA: Diagnosis not present

## 2019-12-21 DIAGNOSIS — L0291 Cutaneous abscess, unspecified: Secondary | ICD-10-CM | POA: Diagnosis not present

## 2019-12-28 DIAGNOSIS — E291 Testicular hypofunction: Secondary | ICD-10-CM | POA: Diagnosis not present

## 2019-12-31 ENCOUNTER — Ambulatory Visit: Payer: Medicare HMO | Admitting: Cardiology

## 2020-01-03 MED FILL — VALACYCLOVIR HCL 500 MG TAB: 500 | 30 days supply | Qty: 60 | Fill #5

## 2020-01-04 DIAGNOSIS — N62 Hypertrophy of breast: Secondary | ICD-10-CM | POA: Diagnosis not present

## 2020-01-04 DIAGNOSIS — N63 Unspecified lump in unspecified breast: Secondary | ICD-10-CM | POA: Diagnosis not present

## 2020-01-05 ENCOUNTER — Other Ambulatory Visit: Payer: Self-pay | Admitting: Family Medicine

## 2020-01-05 DIAGNOSIS — N63 Unspecified lump in unspecified breast: Secondary | ICD-10-CM

## 2020-01-10 MED FILL — BIKTARVY 50-200-25 MG TABS: 50-200-25 | 30 days supply | Qty: 30 | Fill #3

## 2020-01-14 DIAGNOSIS — N529 Male erectile dysfunction, unspecified: Secondary | ICD-10-CM | POA: Diagnosis not present

## 2020-01-14 DIAGNOSIS — E291 Testicular hypofunction: Secondary | ICD-10-CM | POA: Diagnosis not present

## 2020-01-26 ENCOUNTER — Ambulatory Visit
Admission: RE | Admit: 2020-01-26 | Discharge: 2020-01-26 | Disposition: A | Discharge status: Home / Self Care | Payer: Medicare HMO | Source: Ambulatory Visit | Attending: Family Medicine | Admitting: Family Medicine

## 2020-01-26 ENCOUNTER — Other Ambulatory Visit: Payer: Self-pay

## 2020-01-26 ENCOUNTER — Ambulatory Visit: Payer: Medicare HMO

## 2020-01-26 DIAGNOSIS — N63 Unspecified lump in unspecified breast: Secondary | ICD-10-CM

## 2020-01-26 DIAGNOSIS — N6459 Other signs and symptoms in breast: Secondary | ICD-10-CM | POA: Diagnosis not present

## 2020-01-30 NOTE — Progress Notes (Signed)
Cardiology Office Note:   Date:  01/31/2020  NAME:  Vincent Young    MRN: [id number] DOB:  1965/12/17   PCP:  Vernie Shanks, MD  Cardiologist:  No primary care provider on file.  Electrophysiologist:  None   Referring MD: Vernie Shanks, MD   Chief Complaint  Patient presents with  . Chest Pain   History of Present Illness:   Vincent Young is a 54 y.o. male with a hx of HTN, asthma, HIV, GERD who is being seen today for the evaluation of chest pain at the request of Vernie Shanks, MD.  He reports has had intermittent episodes of chest pain for the last 1 year.  He describes it as sharp left-sided chest pain.  Mainly occurs while sitting.  No identifiable triggers.  No alleviating factors.  He reports it can last for up to 30 minutes.  It just goes away without any intervention.  He denies any shortness of breath or palpitations with the pain.  He does have sleep apnea but does not use his machine.  CVD risk factors include hypertension that is well controlled with blood pressure 120/82.  His most recent LDL cholesterol is 60.  He is on Lipitor 80 mg daily.  He also has diabetes with an A1c of 6.6.  He is also morbidly obese with a BMI of 55.  Weight is 398 pounds.  He does not exercise.  He works for [employer].  There is a history of family heart disease in his father.  He does have acid reflux and takes medication.  He has noticed no association of his pain with food.  He is a former smoker.  No illicit drug use reported.  Occasional alcohol use reported.  He does not exercise routinely.  He denies any exertional component to his chest pain.  His EKG is normal today.  Problem List 1. DM -A1c 6.6 2. HLD -Total cholesterol 114, HDL 41, LDL 60, triglycerides 65 3. HTN 4. HIV  Past Medical History: Past Medical History:  Diagnosis Date  . Acute renal failure (HCC)    due to dehydration  . Arthritis   . Asthma   . Diabetes mellitus without complication ([place])   . DJD (degenerative  joint disease)   . Exposure to TB   . Family history of adverse reaction to anesthesia    " My brother has had PONV"  . GERD (gastroesophageal reflux disease)   . Headache   . HIV infection ([place])   . Hypertension   . Obesity   . Pilonidal cyst   . Recurrent genital HSV (herpes simplex virus) infection   . Sleep apnea    never tested-has s/s    Past Surgical History: Past Surgical History:  Procedure Laterality Date  . APPENDECTOMY    . 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 COLONOSCOPY W/ BIOPSIES AND POLYPECTOMY    . COLONOSCOPY WITH PROPOFOL N/A 08/29/2014   Procedure: COLONOSCOPY WITH PROPOFOL;  Surgeon: Clarene Essex, MD;  Location[facility]COPY;  Service: Endoscopy;  Laterality: N/A;  . COLONOSCOPY WITH PROPOFOL N/A 11/16/2019   Procedure: COLONOSCOPY WITH PROPOFOL;  Surgeon: Clarene Essex, MD;  Location: [facility] ENDOSCOPY;  Service: Endoscopy;  Laterality: N/A;  . HERNIA REPAIR  inguinal as a child  . PEG TUBE PLACEMENT  2009  . POLYPECTOMY  11/16/2019   Procedure: POLYPECTOMY;  Surgeon: Clarene Essex, MD;  Location: [facility] ENDOSCOPY;  Service: Endoscopy;;  . TONSILLECTOMY    . UVULECTOMY  2009    Current Medications: Current Meds  Medication Sig  . albuterol (PROVENTIL HFA;VENTOLIN HFA) 108 (90 BASE) MCG/ACT inhaler Inhale 1-2 puffs into the lungs every 6 (six) hours as needed for wheezing or shortness of breath.  Marland Kitchen amLODipine (NORVASC) 5 MG tablet Take 5 mg by mouth daily.   Marland Kitchen atorvastatin (LIPITOR) 80 MG tablet Take 80 mg by mouth daily.  . bictegravir-emtricitabine-tenofovir AF (BIKTARVY) 50-200-25 MG TABS tablet Take 1 tablet by mouth daily.  . diphenhydrAMINE (BENADRYL) 25 MG tablet Take 2 tablets (50 mg total)  by mouth every 8 (eight) hours as needed (allergic reaction).  . fluticasone (FLONASE) 50 MCG/ACT nasal spray Place 2 sprays into both nostrils daily as needed for allergies.   Marland Kitchen loratadine (CLARITIN) 10 MG tablet Take 10 mg by mouth daily.  Marland Kitchen losartan (COZAAR) 50 MG tablet Take 50 mg by mouth daily.  . meloxicam (MOBIC) 15 MG tablet Take 15 mg by mouth daily.  . metFORMIN (GLUCOPHAGE) 500 MG tablet Take 500 mg by mouth daily with breakfast.  . pantoprazole (PROTONIX) 40 MG tablet Take 40 mg by mouth daily before breakfast.  . terazosin (HYTRIN) 2 MG capsule Take 2 mg by mouth at bedtime.   Marland Kitchen testosterone cypionate (DEPOTESTOSTERONE CYPIONATE) 200 MG/ML injection Inject 200 mg into the muscle every 14 (fourteen) days.   . valACYclovir (VALTREX) 500 MG tablet TAKE 2 TABLETS (1,000 MG TOTAL) BY MOUTH DAILY. (Patient taking differently: Take 500 mg by mouth in the morning and at bedtime. )     Allergies:    Black walnut pollen allergy skin test, Shellfish allergy, Other, and Penicillins   Social History: Social History   Socioeconomic History  . Marital status: Legally Separated    Spouse name: Not on file  . Number of children: 6  . Years of education: Not on file  . Highest education level: Not on file  Occupational History  . Occupation: [employer]   Tobacco Use  . Smoking status: Former Smoker    Years: 30.00    Quit date: 12/04/2003    Years since quitting: 16.1  . Smokeless tobacco: Never Used  . Tobacco comment: pt. no longer smokes  Vaping Use  . Vaping Use: Never used  Substance and Sexual Activity  . Alcohol use: Yes    Alcohol/week: 0.0 standard drinks    Comment: occasional  . Drug use: Not Currently    Comment: occasional  . Sexual activity: Yes    Partners: Female    Comment: pt. declined condoms  Other Topics Concern  . Not on file  Social History Narrative  . Not on file   Social Determinants of Health   Financial Resource Strain:   . Difficulty of Paying  Living Expenses: Not on file  Food Insecurity:   . Worried About Charity fundraiser in the Last Year: Not on file  . Ran Out of Food in the Last Year: Not on file  Transportation Needs:   . Lack of Transportation (Medical): Not on file  . Lack of Transportation (Non-Medical): Not on file  Physical Activity:   . Days of Exercise per Week: Not on file  . Minutes of Exercise per Session: Not on file  Stress:   . Feeling  of Stress : Not on file  Social Connections:   . Frequency of Communication with Friends and Family: Not on file  . Frequency of Social Gatherings with Friends and Family: Not on file  . Attends Religious Services: Not on file  . Active Member of Clubs or Organizations: Not on file  . Attends Archivist Meetings: Not on file  . Marital Status: Not on file     Family History: The patient's family history includes Arthritis in his mother and sister; Heart disease in his father; Hypertension in his brother, father, mother, and sister.  ROS:   All other ROS reviewed and negative. Pertinent positives noted in the HPI.     EKGs/Labs/Other Studies Reviewed:   The following studies were personally reviewed by me today:  EKG:  EKG is ordered today.  The ekg ordered today demonstrates normal sinus rhythm, heart rate is 85, no acute ST-T changes, no evidence for infarction, and was personally reviewed by me.   Recent Labs: 09/08/2019: ALT 21; BUN 14; Creat 1.26; Hemoglobin 15.7; Platelets 256; Potassium 4.4; Sodium 141   Recent Lipid Panel    Component Value Date/Time   CHOL 133 09/08/2019 0905   TRIG 69 09/08/2019 0905   HDL 48 09/08/2019 0905   CHOLHDL 2.8 09/08/2019 0905   VLDL 13 12/28/2015 1135   LDLCALC 70 09/08/2019 0905    Physical Exam:   VS:  BP 120/82   Pulse 85   Ht 5' 11"  (1.803 m)   Wt (!) 398 lb (180.5 kg)   SpO2 94%   BMI 55.51 kg/m    Wt Readings from Last 3 Encounters:  01/31/20 (!) 398 lb (180.5 kg)  11/16/19 (!) 385 lb (174.6 kg)   09/22/19 (!) 398 lb (180.5 kg)    General: Well nourished, well developed, in no acute distress Heart: Atraumatic, normal size  Eyes: PEERLA, EOMI  Neck: Supple, no JVD Endocrine: No thryomegaly Cardiac: Normal S1, S2; RRR; no murmurs, rubs, or gallops Lungs: Clear to auscultation bilaterally, no wheezing, rhonchi or rales  Abd: Soft, nontender, no hepatomegaly  Ext: No edema, pulses 2+ Musculoskeletal: No deformities, BUE and BLE strength normal and equal Skin: Warm and dry, no rashes   Neuro: Alert and oriented to person, place, time, and situation, CNII-XII grossly intact, no focal deficits  Psych: Normal mood and affect   ASSESSMENT:   Pius Byrom is a 54 y.o. male who presents for the following: 1. Chest pain, unspecified type   2. Mixed hyperlipidemia   3. Essential hypertension   4. Sleep apnea, unspecified type     PLAN:   1. Chest pain, unspecified type -Atypical chest pain.  EKG is normal.  No acute ischemic changes or evidence of prior infarction.  I would like to proceed with a coronary CTA.  He will take 100 mg metoprolol tartrate 2 hours before the scan.  We need her BMP today.  I would also like to get an echocardiogram.  Main CVD risk factors include hypertension, morbid obesity, HIV, hyperlipidemia, diabetes.   2. Mixed hyperlipidemia -At goal.  Continue Lipitor.  3. Essential hypertension -Well-controlled today.  No change in medication.  4. Sleep apnea, unspecified type -His symptoms may be related to untreated sleep apnea.  He has been diagnosed within the past but does not use his machine.  We will get him set up with a home sleep study.   Disposition: Return in about 3 months (around 05/01/2020).  Medication Adjustments/Labs and Tests  Ordered: Current medicines are reviewed at length with the patient today.  Concerns regarding medicines are outlined above.  Orders Placed This Encounter  Procedures  . CT CORONARY MORPH W/CTA COR W/SCORE W/CA  W/CM &/OR WO/CM  . CT CORONARY FRACTIONAL FLOW RESERVE DATA PREP  . CT CORONARY FRACTIONAL FLOW RESERVE FLUID ANALYSIS  . Basic metabolic panel  . EKG 12-Lead  . ECHOCARDIOGRAM COMPLETE  . ECHOCARDIOGRAM COMPLETE  . Home sleep test   Meds ordered this encounter  Medications  . DISCONTD: metoprolol tartrate (LOPRESSOR) 50 MG tablet    Sig: Take 1 tablet by mouth once for procedure.    Dispense:  1 tablet    Refill:  0  . metoprolol tartrate (LOPRESSOR) 100 MG tablet    Sig: Take 1 tablet by mouth once for procedure.    Dispense:  1 tablet    Refill:  0    Disregard 50 mg tablet.    Patient Instructions  Medication Instructions:  Take Metoprolol 100 mg two hours before CT when scheduled.   *If you need a refill on your cardiac medications before your next appointment, please call your pharmacy*   Lab Work: BMET today   If you have labs (blood work) drawn today and your tests are completely normal, you will receive your results only by: Marland Kitchen MyChart Message (if you have MyChart) OR . A paper copy in the mail If you have any lab test that is abnormal or we need to change your treatment, we will call you to review the results.   Testing/Procedures: Your physician has requested that you have cardiac CT. Cardiac computed tomography (CT) is a painless test that uses an x-ray machine to take clear, detailed pictures of your heart. For further information please visit [email or web address].[email or web address]. Please follow instruction sheet as given.   Echocardiogram - Your physician has requested that you have an echocardiogram. Echocardiography is a painless test that uses sound waves to create images of your heart. It provides your doctor with information about the size and shape of your heart and how well your heart's chambers and valves are working. This procedure takes approximately one hour. There are no restrictions for this procedure. This will be performed at our [facility] location - [address].  Your physician has recommended that you have a sleep study. This test records several body functions during sleep, including: brain activity, eye movement, oxygen and carbon dioxide blood levels, heart rate and rhythm, breathing rate and rhythm, the flow of air through your mouth and nose, snoring, body muscle movements, and chest and belly movement.    Follow-Up: At [facility], you and your health needs are our priority.  As part of our continuing mission to provide you with exceptional heart care, we have created designated Provider Care Teams.  These Care Teams include your primary Cardiologist (physician) and Advanced Practice Providers (APPs -  Physician Assistants and Nurse Practitioners) who all work together to provide you with the care you need, when you need it.  We recommend signing up for the patient portal called "MyChart".  Sign up information is provided on this After Visit Summary.  MyChart is used to connect with patients for Virtual Visits (Telemedicine).  Patients are able to view lab/test results, encounter notes, upcoming appointments, etc.  Non-urgent messages can be sent to your provider as well.   To learn more about what you can do with MyChart, go to [email or web address].    Your next  appointment:   3 month(s)  The format for your next appointment:   In Person  Provider:   Eleonore Chiquito, MD   Other Instructions  Your cardiac CT will be scheduled at one of the below locations:   [facility] [address] [place], [place] [address] [phone]  [place] [address] [place], [place] [address] [phone]  If scheduled at [facility], please arrive at the [facility] main entrance of [facility] 30 minutes prior to test start time. Proceed to the [facility] Radiology Department (first floor) to check-in and test prep.  If scheduled at  [facility], please arrive 15 mins early for check-in and test prep.  Please follow these instructions carefully (unless otherwise directed):  Hold all erectile dysfunction medications at least 3 days (72 hrs) prior to test.  On the Night Before the Test: . Be sure to Drink plenty of water. . Do not consume any caffeinated/decaffeinated beverages or chocolate 12 hours prior to your test. . Do not take any antihistamines 12 hours prior to your test.  On the Day of the Test: . Drink plenty of water. Do not drink any water within one hour of the test. . Do not eat any food 4 hours prior to the test. . You may take your regular medications prior to the test.  . Take metoprolol (Lopressor) two hours prior to test. . HOLD Furosemide/Hydrochlorothiazide morning of the test. . FEMALES- please wear underwire-free bra if available  After the Test: . Drink plenty of water. . After receiving IV contrast, you may experience a mild flushed feeling. This is normal. . On occasion, you may experience a mild rash up to 24 hours after the test. This is not dangerous. If this occurs, you can take Benadryl 25 mg and increase your fluid intake. . If you experience trouble breathing, this can be serious. If it is severe call 911 IMMEDIATELY. If it is mild, please call our office. . If you take any of these medications: Glipizide/Metformin, Avandament, Glucavance, please do not take 48 hours after completing test unless otherwise instructed.   Once we have confirmed authorization from your insurance company, we will call you to set up a date and time for your test. Based on how quickly your insurance processes prior authorizations requests, please allow up to 4 weeks to be contacted for scheduling your Cardiac CT appointment. Be advised that routine Cardiac CT appointments could be scheduled as many as 8 weeks after your provider has ordered it.  For non-scheduling related questions,  please contact the cardiac imaging nurse navigator should you have any questions/concerns: Marchia Bond, Cardiac Imaging Nurse Navigator Burley Saver, Interim Cardiac Imaging Nurse [place] [facility] Direct Office Dial: [phone]   For scheduling needs, including cancellations and rescheduling, please call Vivien Rota at [phone], option 3.          Signed, Addison Naegeli. Audie Box, [place]  [address] [place], [place] [address] [phone]  01/31/2020 12:10 PM

## 2020-01-31 ENCOUNTER — Ambulatory Visit: Payer: Medicare HMO | Admitting: Cardiovascular Disease

## 2020-01-31 ENCOUNTER — Other Ambulatory Visit: Payer: Self-pay

## 2020-01-31 ENCOUNTER — Encounter: Payer: Self-pay | Admitting: Cardiovascular Disease

## 2020-01-31 VITALS — BP 120/82 | HR 85 | Ht 71.0 in | Wt 398.0 lb

## 2020-01-31 DIAGNOSIS — E782 Mixed hyperlipidemia: Secondary | ICD-10-CM

## 2020-01-31 DIAGNOSIS — I10 Essential (primary) hypertension: Secondary | ICD-10-CM

## 2020-01-31 DIAGNOSIS — G4730 Sleep apnea, unspecified: Secondary | ICD-10-CM | POA: Diagnosis not present

## 2020-01-31 DIAGNOSIS — E291 Testicular hypofunction: Secondary | ICD-10-CM | POA: Diagnosis not present

## 2020-01-31 DIAGNOSIS — R079 Chest pain, unspecified: Secondary | ICD-10-CM | POA: Diagnosis not present

## 2020-01-31 LAB — BASIC METABOLIC PANEL
BUN/Creatinine Ratio: 10 (ref 9–20)
BUN: 12 mg/dL (ref 6–24)
CO2: 24 mmol/L (ref 20–29)
Calcium: 9.4 mg/dL (ref 8.7–10.2)
Chloride: 100 mmol/L (ref 96–106)
Creatinine, Ser: 1.26 mg/dL (ref 0.76–1.27)
GFR calc Af Amer: 73 mL/min/{1.73_m2} (ref >59)
GFR calc non Af Amer: 63 mL/min/{1.73_m2} (ref >59)
Glucose: 87 mg/dL (ref 65–99)
Potassium: 5 mmol/L (ref 3.5–5.2)
Sodium: 137 mmol/L (ref 134–144)

## 2020-01-31 MED ORDER — METOPROLOL TARTRATE 100 MG PO TABS
ORAL_TABLET | ORAL | 0 refills | Status: DC
Start: 2020-01-31 — End: 2021-07-27

## 2020-01-31 MED ORDER — METOPROLOL TARTRATE 50 MG PO TABS
ORAL_TABLET | ORAL | 0 refills | Status: DC
Start: 2020-01-31 — End: 2020-01-31

## 2020-01-31 NOTE — Patient Instructions (Addendum)
Medication Instructions:  Take Metoprolol 100 mg two hours before CT when scheduled.   *If you need a refill on your cardiac medications before your next appointment, please call your pharmacy*   Lab Work: BMET today   If you have labs (blood work) drawn today and your tests are completely normal, you will receive your results only by: Marland Kitchen MyChart Message (if you have MyChart) OR . A paper copy in the mail If you have any lab test that is abnormal or we need to change your treatment, we will call you to review the results.   Testing/Procedures: Your physician has requested that you have cardiac CT. Cardiac computed tomography (CT) is a painless test that uses an x-ray machine to take clear, detailed pictures of your heart. For further information please visit [email or web address].[email or web address]. Please follow instruction sheet as given.   Echocardiogram - Your physician has requested that you have an echocardiogram. Echocardiography is a painless test that uses sound waves to create images of your heart. It provides your doctor with information about the size and shape of your heart and how well your heart's chambers and valves are working. This procedure takes approximately one hour. There are no restrictions for this procedure. This will be performed at our [facility] location - [address], [address].  Your physician has recommended that you have a sleep study. This test records several body functions during sleep, including: brain activity, eye movement, oxygen and carbon dioxide blood levels, heart rate and rhythm, breathing rate and rhythm, the flow of air through your mouth and nose, snoring, body muscle movements, and chest and belly movement.    Follow-Up: At [facility], you and your health needs are our priority.  As part of our continuing mission to provide you with exceptional heart care, we have created designated Provider Care Teams.  These Care Teams include your primary Cardiologist  (physician) and Advanced Practice Providers (APPs -  Physician Assistants and Nurse Practitioners) who all work together to provide you with the care you need, when you need it.  We recommend signing up for the patient portal called "MyChart".  Sign up information is provided on this After Visit Summary.  MyChart is used to connect with patients for Virtual Visits (Telemedicine).  Patients are able to view lab/test results, encounter notes, upcoming appointments, etc.  Non-urgent messages can be sent to your provider as well.   To learn more about what you can do with MyChart, go to [email or web address].    Your next appointment:   3 month(s)  The format for your next appointment:   In Person  Provider:   Eleonore Chiquito, MD   Other Instructions  Your cardiac CT will be scheduled at one of the below locations:   [facility] [address] [place], [place] [address] [phone]  [place] [address] [place], [place] [address] [phone]  If scheduled at [facility], please arrive at the [facility] main entrance of [facility] 30 minutes prior to test start time. Proceed to the [facility] Radiology Department (first floor) to check-in and test prep.  If scheduled at [facility], please arrive 15 mins early for check-in and test prep.  Please follow these instructions carefully (unless otherwise directed):  Hold all erectile dysfunction medications at least 3 days (72 hrs) prior to test.  On the Night Before the Test: . Be sure to Drink  plenty of water. . Do not consume any caffeinated/decaffeinated beverages or chocolate 12 hours prior to your test. . Do not take any antihistamines 12 hours prior to your test.  On the Day of the Test: . Drink plenty of water. Do not drink any water within one hour of the test. . Do not eat any food 4 hours prior to the  test. . You may take your regular medications prior to the test.  . Take metoprolol (Lopressor) two hours prior to test. . HOLD Furosemide/Hydrochlorothiazide morning of the test. . FEMALES- please wear underwire-free bra if available  After the Test: . Drink plenty of water. . After receiving IV contrast, you may experience a mild flushed feeling. This is normal. . On occasion, you may experience a mild rash up to 24 hours after the test. This is not dangerous. If this occurs, you can take Benadryl 25 mg and increase your fluid intake. . If you experience trouble breathing, this can be serious. If it is severe call 911 IMMEDIATELY. If it is mild, please call our office. . If you take any of these medications: Glipizide/Metformin, Avandament, Glucavance, please do not take 48 hours after completing test unless otherwise instructed.   Once we have confirmed authorization from your insurance company, we will call you to set up a date and time for your test. Based on how quickly your insurance processes prior authorizations requests, please allow up to 4 weeks to be contacted for scheduling your Cardiac CT appointment. Be advised that routine Cardiac CT appointments could be scheduled as many as 8 weeks after your provider has ordered it.  For non-scheduling related questions, please contact the cardiac imaging nurse navigator should you have any questions/concerns: Marchia Bond, Cardiac Imaging Nurse Navigator Burley Saver, Interim Cardiac Imaging Nurse [place] [facility] Services Direct Office Dial: [phone]   For scheduling needs, including cancellations and rescheduling, please call Vivien Rota at [phone], option 3.

## 2020-02-01 ENCOUNTER — Other Ambulatory Visit: Payer: Self-pay | Admitting: Internal Medicine

## 2020-02-01 DIAGNOSIS — B20 Human immunodeficiency virus [HIV] disease: Secondary | ICD-10-CM

## 2020-02-01 MED FILL — VALACYCLOVIR HCL 500 MG TAB: 500 | 30 days supply | Qty: 60 | Fill #0

## 2020-02-02 ENCOUNTER — Other Ambulatory Visit: Payer: Self-pay

## 2020-02-02 ENCOUNTER — Ambulatory Visit (HOSPITAL_COMMUNITY): Payer: Medicare HMO | Attending: Internal Medicine

## 2020-02-02 DIAGNOSIS — R079 Chest pain, unspecified: Secondary | ICD-10-CM | POA: Insufficient documentation

## 2020-02-02 LAB — ECHOCARDIOGRAM COMPLETE
Area-P 1/2: 5.02 cm2
S' Lateral: 3.2 cm

## 2020-02-03 ENCOUNTER — Telehealth: Payer: Self-pay | Admitting: Cardiovascular Disease

## 2020-02-03 NOTE — Telephone Encounter (Signed)
Nurse informed pt that MD recommended he have a Cardiac CTA and a scheduler will contact him to schedule once it's been approved by insurance. Pt verbalized understanding.

## 2020-02-03 NOTE — Telephone Encounter (Signed)
Mussa is calling requesting to schedule a stress test. Please advise.

## 2020-02-08 ENCOUNTER — Telehealth: Payer: Self-pay | Admitting: *Deleted

## 2020-02-08 NOTE — Telephone Encounter (Signed)
Patient notified of [place] appointment.

## 2020-02-10 MED FILL — BIKTARVY 50-200-25 MG TABS: 50-200-25 | 30 days supply | Qty: 30 | Fill #4

## 2020-02-11 DIAGNOSIS — E291 Testicular hypofunction: Secondary | ICD-10-CM | POA: Diagnosis not present

## 2020-02-14 ENCOUNTER — Telehealth (HOSPITAL_COMMUNITY): Payer: Self-pay | Admitting: Emergency Medicine

## 2020-02-14 NOTE — Telephone Encounter (Signed)
Reaching out to patient to offer assistance regarding upcoming cardiac imaging study; pt verbalizes understanding of appt date/time, parking situation and where to check in, pre-test NPO status and medications ordered, and verified current allergies; name and call back number provided for further questions should they arise Marchia Bond RN Navigator Cardiac Imaging [facility] [phone] office [phone] cell  Pt holding claritin, flonase, benadryl, and any ED medications, taking metoprolol 2 hr prior to scan. Pt verbalized understanding. Clarise Cruz

## 2020-02-16 ENCOUNTER — Ambulatory Visit (HOSPITAL_COMMUNITY): Payer: Medicare HMO

## 2020-02-16 DIAGNOSIS — E1169 Type 2 diabetes mellitus with other specified complication: Secondary | ICD-10-CM | POA: Diagnosis not present

## 2020-02-16 DIAGNOSIS — M170 Bilateral primary osteoarthritis of knee: Secondary | ICD-10-CM | POA: Diagnosis not present

## 2020-02-16 DIAGNOSIS — J4541 Moderate persistent asthma with (acute) exacerbation: Secondary | ICD-10-CM | POA: Diagnosis not present

## 2020-02-16 DIAGNOSIS — E7800 Pure hypercholesterolemia, unspecified: Secondary | ICD-10-CM | POA: Diagnosis not present

## 2020-02-16 DIAGNOSIS — I10 Essential (primary) hypertension: Secondary | ICD-10-CM | POA: Diagnosis not present

## 2020-02-16 DIAGNOSIS — K219 Gastro-esophageal reflux disease without esophagitis: Secondary | ICD-10-CM | POA: Diagnosis not present

## 2020-02-16 DIAGNOSIS — E119 Type 2 diabetes mellitus without complications: Secondary | ICD-10-CM | POA: Diagnosis not present

## 2020-02-25 DIAGNOSIS — M25561 Pain in right knee: Secondary | ICD-10-CM | POA: Diagnosis not present

## 2020-02-25 DIAGNOSIS — E291 Testicular hypofunction: Secondary | ICD-10-CM | POA: Diagnosis not present

## 2020-02-25 DIAGNOSIS — Z6841 Body Mass Index (BMI) 40.0 and over, adult: Secondary | ICD-10-CM | POA: Diagnosis not present

## 2020-02-25 DIAGNOSIS — M25562 Pain in left knee: Secondary | ICD-10-CM | POA: Diagnosis not present

## 2020-02-25 DIAGNOSIS — M1712 Unilateral primary osteoarthritis, left knee: Secondary | ICD-10-CM | POA: Diagnosis not present

## 2020-02-25 DIAGNOSIS — M1711 Unilateral primary osteoarthritis, right knee: Secondary | ICD-10-CM | POA: Diagnosis not present

## 2020-02-28 ENCOUNTER — Telehealth (HOSPITAL_COMMUNITY): Payer: Self-pay | Admitting: Emergency Medicine

## 2020-02-28 NOTE — Telephone Encounter (Signed)
Reaching out to patient to offer assistance regarding upcoming cardiac imaging study; pt verbalizes understanding of appt date/time, parking situation and where to check in, pre-test NPO status and medications ordered, and verified current allergies; name and call back number provided for further questions should they arise Marchia Bond RN Navigator Cardiac Imaging [facility] [phone] office [phone] cell   Pt instructed to avoid ED medications, flonase, and claritin. Failed to pick up metoprolol, I called his pharm to have it re-prepared for pick up, pt verbalized understanding to take 2 hr prior to scan Clarise Cruz

## 2020-02-29 ENCOUNTER — Telehealth (HOSPITAL_COMMUNITY): Payer: Self-pay | Admitting: Emergency Medicine

## 2020-02-29 ENCOUNTER — Encounter (HOSPITAL_COMMUNITY): Payer: Self-pay

## 2020-02-29 ENCOUNTER — Ambulatory Visit (HOSPITAL_COMMUNITY)
Admission: RE | Admit: 2020-02-29 | Discharge: 2020-02-29 | Disposition: A | Discharge status: Home / Self Care | Payer: Medicare HMO | Source: Ambulatory Visit | Attending: Cardiovascular Disease | Admitting: Cardiovascular Disease

## 2020-02-29 ENCOUNTER — Other Ambulatory Visit: Payer: Self-pay

## 2020-02-29 DIAGNOSIS — R079 Chest pain, unspecified: Secondary | ICD-10-CM | POA: Diagnosis present

## 2020-02-29 DIAGNOSIS — I6612 Occlusion and stenosis of left anterior cerebral artery: Secondary | ICD-10-CM | POA: Insufficient documentation

## 2020-02-29 DIAGNOSIS — I2510 Atherosclerotic heart disease of native coronary artery without angina pectoris: Secondary | ICD-10-CM | POA: Diagnosis not present

## 2020-02-29 MED ORDER — NITROGLYCERIN 0.4 MG SL SUBL
0.8000 mg | SUBLINGUAL_TABLET | Freq: Once | SUBLINGUAL | Status: AC
  Administered 2020-02-29: 0.8 mg via SUBLINGUAL

## 2020-02-29 MED ORDER — METOPROLOL TARTRATE 5 MG/5ML IV SOLN
INTRAVENOUS | Status: AC
  Administered 2020-02-29: 5 mg via INTRAVENOUS
  Filled 2020-02-29: qty 15

## 2020-02-29 MED ORDER — IOHEXOL 350 MG/ML SOLN
100.0000 mL | Freq: Once | INTRAVENOUS | Status: AC | PRN
  Administered 2020-02-29: 100 mL via INTRAVENOUS

## 2020-02-29 MED ORDER — METOPROLOL TARTRATE 5 MG/5ML IV SOLN
5.0000 mg | INTRAVENOUS | Status: DC | PRN
  Administered 2020-02-29 (×2): 5 mg via INTRAVENOUS

## 2020-02-29 NOTE — Telephone Encounter (Signed)
Pt calling to clarify medications to take prior to scan. Pt requesting to take metoprolol with small amt of food. I told him he could eat a piece of toast or granola bar, etc.   Clarise Cruz

## 2020-03-01 ENCOUNTER — Other Ambulatory Visit: Payer: Self-pay | Admitting: *Deleted

## 2020-03-01 MED ORDER — ASPIRIN EC 81 MG PO TBEC: 81.0000 mg | DELAYED_RELEASE_TABLET | Freq: Every day | ORAL | 3 refills | Status: AC

## 2020-03-01 MED ORDER — NITROGLYCERIN 0.4 MG SL SUBL: 0.4000 mg | SUBLINGUAL_TABLET | SUBLINGUAL | 3 refills | Status: DC | PRN

## 2020-03-07 NOTE — Progress Notes (Signed)
Cardiology Office Note:   Date:  03/09/2020  NAME:  Vincent Young    MRN: [id number] DOB:  [birth year]   PCP:  Vernie Shanks, MD  Cardiologist:  No primary care provider on file.   Referring MD: Vernie Shanks, MD   Chief Complaint  Patient presents with  . Coronary Artery Disease   History of Present Illness:   Vincent Young is a 54 y.o. male with a hx of DM, HLD, HTN, HIV who presents for follow-up of chest pain. CCTA concerning for severe CAD.  He reports he continues to have episodes of daily chest pain.  Described as sharp left-sided chest pain.  Can occur at any time.  Reports does not occur with exertion.  He is morbidly obese and does not exert himself much.  He reports the pain can last 20 to 30 minutes.  He has not tried any nitroglycerin.  Has not had any severe pain to warrant emergency room evaluation.  His blood pressure well controlled today.  We did go over the results of his cardiac CTA which demonstrated possible high-grade stenosis in a diagonal branch.  His EKG today is normal.  There are no acute ischemic changes.  He has no prior evidence of infarction.  His echocardiogram is normal.  Most recent lipid profile normal.  He does need a refill on his Lipitor.  Everything else stable.  Needs left heart cath.  Problem List 1. DM -A1c 6.6 2. HLD -Total cholesterol 114, HDL 41, LDL 60, triglycerides 65 3. HTN 4. HIV 5. CAD -CAC score 195 (94th percentile) -severe stenosis D1  Past Medical History: Past Medical History:  Diagnosis Date  . Acute renal failure (HCC)    due to dehydration  . Arthritis   . Asthma   . Diabetes mellitus without complication ([place])   . DJD (degenerative joint disease)   . Exposure to TB   . Family history of adverse reaction to anesthesia    " My brother has had PONV"  . GERD (gastroesophageal reflux disease)   . Headache   . HIV infection ([place])   . Hypertension   . Obesity   . Pilonidal cyst   . Recurrent genital HSV  (herpes simplex virus) infection   . Sleep apnea    never tested-has s/s    Past Surgical History: Past Surgical History:  Procedure Laterality Date  . APPENDECTOMY    . 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 COLONOSCOPY W/ BIOPSIES AND POLYPECTOMY    . COLONOSCOPY WITH PROPOFOL N/A 08/29/2014   Procedure: COLONOSCOPY WITH PROPOFOL;  Surgeon: Clarene Essex, MD;  Location[facility] ENDOSCOPY;  Service: Endoscopy;  Laterality: N/A;  . COLONOSCOPY WITH PROPOFOL N/A 11/16/2019   Procedure: COLONOSCOPY WITH PROPOFOL;  Surgeon: Clarene Essex, MD;  Location: [facility] ENDOSCOPY;  Service: Endoscopy;  Laterality: N/A;  . HERNIA REPAIR     inguinal as a child  . PEG TUBE PLACEMENT  2009  . POLYPECTOMY  11/16/2019   Procedure: POLYPECTOMY;  Surgeon: Clarene Essex, MD;  Location: [facility] ENDOSCOPY;  Service: Endoscopy;;  . TONSILLECTOMY    . UVULECTOMY  2009    Current Medications: Current Meds  Medication Sig  . albuterol (PROVENTIL HFA;VENTOLIN HFA) 108 (90 BASE) MCG/ACT inhaler Inhale 1-2 puffs  into the lungs every 6 (six) hours as needed for wheezing or shortness of breath.  Marland Kitchen amLODipine (NORVASC) 5 MG tablet Take 5 mg by mouth daily.   Marland Kitchen aspirin EC 81 MG tablet Take 1 tablet (81 mg total) by mouth daily. Swallow whole.  Marland Kitchen atorvastatin (LIPITOR) 80 MG tablet Take 80 mg by mouth daily.  . bictegravir-emtricitabine-tenofovir AF (BIKTARVY) 50-200-25 MG TABS tablet Take 1 tablet by mouth daily.  . diphenhydrAMINE (BENADRYL) 25 MG tablet Take 2 tablets (50 mg total) by mouth every 8 (eight) hours as needed (allergic reaction).  . fluticasone (FLONASE) 50 MCG/ACT nasal spray Place 2 sprays into both nostrils daily as needed for allergies.   Marland Kitchen loratadine  (CLARITIN) 10 MG tablet Take 10 mg by mouth daily.  Marland Kitchen losartan (COZAAR) 50 MG tablet Take 50 mg by mouth daily.  . meloxicam (MOBIC) 15 MG tablet Take 15 mg by mouth daily.  . metFORMIN (GLUCOPHAGE) 500 MG tablet Take 500 mg by mouth daily with breakfast.  . metoprolol tartrate (LOPRESSOR) 100 MG tablet Take 1 tablet by mouth once for procedure.  . nitroGLYCERIN (NITROSTAT) 0.4 MG SL tablet Place 1 tablet (0.4 mg total) under the tongue every 5 (five) minutes as needed.  . pantoprazole (PROTONIX) 40 MG tablet Take 40 mg by mouth daily before breakfast.  . terazosin (HYTRIN) 2 MG capsule Take 2 mg by mouth at bedtime.   Marland Kitchen testosterone cypionate (DEPOTESTOSTERONE CYPIONATE) 200 MG/ML injection Inject 200 mg into the muscle every 14 (fourteen) days.   . valACYclovir (VALTREX) 500 MG tablet TAKE 2 TABLETS (1,000 MG TOTAL) BY MOUTH DAILY.   Current Facility-Administered Medications for the 03/09/20 encounter (Office Visit) with Geralynn Rile, MD  Medication  . sodium chloride flush (NS) 0.9 % injection 3 mL     Allergies:    Black walnut pollen allergy skin test, Shellfish allergy, Other, and Penicillins   Social History: Social History   Socioeconomic History  . Marital status: Legally Separated    Spouse name: Not on file  . Number of children: 6  . Years of education: Not on file  . Highest education level: Not on file  Occupational History  . Occupation: [employer]   Tobacco Use  . Smoking status: Former Smoker    Years: 30.00    Quit date: 12/04/2003    Years since quitting: 16.2  . Smokeless tobacco: Never Used  . Tobacco comment: pt. no longer smokes  Vaping Use  . Vaping Use: Never used  Substance and Sexual Activity  . Alcohol use: Yes    Alcohol/week: 0.0 standard drinks    Comment: occasional  . Drug use: Not Currently    Comment: occasional  . Sexual activity: Yes    Partners: Female    Comment: pt. declined condoms  Other Topics Concern  . Not on file   Social History Narrative  . Not on file   Social Determinants of Health   Financial Resource Strain:   . Difficulty of Paying Living Expenses: Not on file  Food Insecurity:   . Worried About Charity fundraiser in the Last Year: Not on file  . Ran Out of Food in the Last Year: Not on file  Transportation Needs:   . Lack of Transportation (Medical): Not on file  . Lack of Transportation (Non-Medical): Not on file  Physical Activity:   . Days of Exercise per Week: Not on file  . Minutes of Exercise per Session: Not on file  Stress:   . Feeling of Stress : Not on file  Social Connections:   . Frequency of Communication with Friends and Family: Not on file  . Frequency of Social Gatherings with Friends and Family: Not on file  . Attends Religious Services: Not on file  . Active Member of Clubs or Organizations: Not on file  . Attends Archivist Meetings: Not on file  . Marital Status: Not on file     Family History: The patient's family history includes Arthritis in his mother and sister; Heart disease in his father; Hypertension in his brother, father, mother, and sister.  ROS:   All other ROS reviewed and negative. Pertinent positives noted in the HPI.     EKGs/Labs/Other Studies Reviewed:   The following studies were personally reviewed by me today:  EKG:  EKG is ordered today.  The ekg ordered today demonstrates normal sinus rhythm, heart rate 93, no acute ischemic changes, no evidence of prior infarction, and was personally reviewed by me.   TTE 02/02/2020 1. Left ventricular ejection fraction, by estimation, is 55 to 60%. The  left ventricle has normal function. The left ventricle has no regional  wall motion abnormalities. There is mild left ventricular hypertrophy.  Left ventricular diastolic parameters  were normal.  2. Right ventricular systolic function is normal. The right ventricular  size is normal.  3. The mitral valve is grossly normal. Trivial  mitral valve  regurgitation. No evidence of mitral stenosis.  4. The aortic valve is tricuspid. Aortic valve regurgitation is not  visualized. No aortic stenosis is present.  5. Aortic dilatation noted. There is borderline dilatation of the  ascending aorta, measuring 38 mm.  6. The inferior vena cava is normal in size with greater than 50%  respiratory variability, suggesting right atrial pressure of 3 mmHg.   CCTA 02/29/2020 1. Coronary calcium score of 195. This was 94th percentile for age and sex matched control.  2. Normal coronary origin with right dominance.  3. There is severe (>70%) stenosis in proximal D1. There is also significant plaque in the distal LAD. However this region cannot be well-visualized.  4. Study is limited by body habitus.  5. Consider cardiac catheterization for more definitive evaluation of the coronaries and concern for significant stenosis in the distal LAD, D1 and RI.  Recent Labs: 09/08/2019: ALT 21; Hemoglobin 15.7; Platelets 256 01/31/2020: BUN 12; Creatinine, Ser 1.26; Potassium 5.0; Sodium 137   Recent Lipid Panel    Component Value Date/Time   CHOL 133 09/08/2019 0905   TRIG 69 09/08/2019 0905   HDL 48 09/08/2019 0905   CHOLHDL 2.8 09/08/2019 0905   VLDL 13 12/28/2015 1135   LDLCALC 70 09/08/2019 0905    Physical Exam:   VS:  BP (!) 142/90   Pulse 93   Ht 5\' 11"  (1.803 m)   Wt (!) 399 lb 9.6 oz (181.3 kg)   SpO2 95%   BMI 55.73 kg/m    Wt Readings from Last 3 Encounters:  03/09/20 (!) 399 lb 9.6 oz (181.3 kg)  01/31/20 (!) 398 lb (180.5 kg)  11/16/19 (!) 385 lb (174.6 kg)    General: Well nourished, well developed, in no acute distress Heart: Atraumatic, normal size  Eyes: PEERLA, EOMI  Neck: Supple, no JVD Endocrine: No thryomegaly Cardiac: Normal S1, S2; RRR; no murmurs, rubs, or gallops Lungs: Clear to auscultation bilaterally, no wheezing, rhonchi or rales  Abd: Soft, nontender, no hepatomegaly  Ext: No  edema, pulses  2+ Musculoskeletal: No deformities, BUE and BLE strength normal and equal Skin: Warm and dry, no rashes   Neuro: Alert and oriented to person, place, time, and situation, CNII-XII grossly intact, no focal deficits  Psych: Normal mood and affect   ASSESSMENT:   Vincent Young is a 54 y.o. male who presents for the following: 1. Coronary artery disease of native artery of native heart with stable angina pectoris (Vincent Young)   2. Mixed hyperlipidemia   3. Primary hypertension     PLAN:   1. Coronary artery disease of native artery of native heart with stable angina pectoris (Vincent Young) -Continues to have atypical chest pain.  EKG normal.  Echo normal.  Coronary CTA was extremely poor quality my opinion.  It was read as severe stenosis in the first diagonal branch.  The only way to determine what is actually going on is pursue left heart catheterization.  We will set him up for this.  He will give [place] a CBC and BMP today. -He was instructed to continue aspirin and statin.  He is on amlodipine which is an antianginal.  He also was given a prescription for nitroglycerin.  He was given strict return precautions including pain that lasts more than 3:15 doses of sublingual nitroglycerin, 3 to 5 minutes per dose.  He should then present in the emergency room.  We will set him up for left heart catheterization early next week.  2. Mixed hyperlipidemia -Most recent LDL cholesterol at goal.  Continue statin.  3. Primary hypertension -BP well controlled.  Continue current medications.   Disposition: Return in about 1 month (around 04/09/2020).  Medication Adjustments/Labs and Tests Ordered: Current medicines are reviewed at length with the patient today.  Concerns regarding medicines are outlined above.  Orders Placed This Encounter  Procedures  . Basic metabolic panel  . CBC   No orders of the defined types were placed in this encounter.   Patient Instructions     [address] [place] [address] Dept: [phone] Loc: [place]  03/09/2020  You are scheduled for a Cardiac Catheterization on Tuesday, October 26th with Dr. Daneen Schick.  1. Please arrive at the [facility] (Main Entrance A) at [facility]: [address] [place], [place] [address] at 9:00 AM (This time is two hours before your procedure to ensure your preparation). Free valet parking service is available.   Special note: Every effort is made to have your procedure done on time. Please understand that emergencies sometimes delay scheduled procedures.  2. Diet: Do not eat solid foods after midnight.  The patient may have clear liquids until 5am upon the day of the procedure.  3. Labs: Today in office   Covid test: Friday, October 22nd at [place], [place]   4. Medication instructions in preparation for your procedure:   Contrast Allergy: No  Hold terazosin AM of procedure  Do not take Diabetes Med Glucophage (Metformin) on the day of the procedure and HOLD 48 HOURS AFTER THE PROCEDURE.  On the morning of your procedure, take your Aspirin and any morning medicines NOT listed above.  You may use sips of water.  5. Plan for one night stay--bring personal belongings. 6. Bring a current list of your medications and current insurance cards. 7. You MUST have a responsible person to drive you home. 8. Someone MUST be with you the first  24 hours after you arrive home or your discharge will be delayed. 9. Please wear clothes that are easy to get on and off and wear slip-on shoes.  Thank you for allowing [place] to care for you!   -- [facility]   Follow up in 1 month with Dr. Audie Box      Time Spent with Patient: I have spent a total of 35 minutes with patient reviewing hospital notes, telemetry, EKGs, labs and  examining the patient as well as establishing an assessment and plan that was discussed with the patient.  > 50% of time was spent in direct patient care.  Signed, Addison Naegeli. Audie Box, [place]  [address], [place], [place] [address] [phone]  03/09/2020 1:41 PM

## 2020-03-07 NOTE — H&P (View-Only) (Signed)
Cardiology Office Note:   Date:  03/09/2020  NAME:  Vincent Young    MRN: [id number] DOB:  1965-12-12   PCP:  Vernie Shanks, MD  Cardiologist:  No primary care provider on file.   Referring MD: Vernie Shanks, MD   Chief Complaint  Patient presents with  . Coronary Artery Disease   History of Present Illness:   Vincent Young is a 54 y.o. male with a hx of DM, HLD, HTN, HIV who presents for follow-up of chest pain. CCTA concerning for severe CAD.  He reports he continues to have episodes of daily chest pain.  Described as sharp left-sided chest pain.  Can occur at any time.  Reports does not occur with exertion.  He is morbidly obese and does not exert himself much.  He reports the pain can last 20 to 30 minutes.  He has not tried any nitroglycerin.  Has not had any severe pain to warrant emergency room evaluation.  His blood pressure well controlled today.  We did go over the results of his cardiac CTA which demonstrated possible high-grade stenosis in a diagonal branch.  His EKG today is normal.  There are no acute ischemic changes.  He has no prior evidence of infarction.  His echocardiogram is normal.  Most recent lipid profile normal.  He does need a refill on his Lipitor.  Everything else stable.  Needs left heart cath.  Problem List 1. DM -A1c 6.6 2. HLD -Total cholesterol 114, HDL 41, LDL 60, triglycerides 65 3. HTN 4. HIV 5. CAD -CAC score 195 (94th percentile) -severe stenosis D1  Past Medical History: Past Medical History:  Diagnosis Date  . Acute renal failure (HCC)    due to dehydration  . Arthritis   . Asthma   . Diabetes mellitus without complication ([place])   . DJD (degenerative joint disease)   . Exposure to TB   . Family history of adverse reaction to anesthesia    " My brother has had PONV"  . GERD (gastroesophageal reflux disease)   . Headache   . HIV infection ([place])   . Hypertension   . Obesity   . Pilonidal cyst   . Recurrent genital HSV  (herpes simplex virus) infection   . Sleep apnea    never tested-has s/s    Past Surgical History: Past Surgical History:  Procedure Laterality Date  . APPENDECTOMY    . 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 COLONOSCOPY W/ BIOPSIES AND POLYPECTOMY    . COLONOSCOPY WITH PROPOFOL N/A 08/29/2014   Procedure: COLONOSCOPY WITH PROPOFOL;  Surgeon: Clarene Essex, MD;  Location: [facility] ENDOSCOPY;  Service: Endoscopy;  Laterality: N/A;  . COLONOSCOPY WITH PROPOFOL N/A 11/16/2019   Procedure: COLONOSCOPY WITH PROPOFOL;  Surgeon: Clarene Essex, MD;  Location: [facility] ENDOSCOPY;  Service: Endoscopy;  Laterality: N/A;  . HERNIA REPAIR     inguinal as a child  . PEG TUBE PLACEMENT  2009  . POLYPECTOMY  11/16/2019   Procedure: POLYPECTOMY;  Surgeon: Clarene Essex, MD;  Location: [facility] ENDOSCOPY;  Service: Endoscopy;;  . TONSILLECTOMY    . UVULECTOMY  2009    Current Medications: Current Meds  Medication Sig  . albuterol (PROVENTIL HFA;VENTOLIN HFA) 108 (90 BASE) MCG/ACT inhaler Inhale 1-2 puffs  into the lungs every 6 (six) hours as needed for wheezing or shortness of breath.  Marland Kitchen amLODipine (NORVASC) 5 MG tablet Take 5 mg by mouth daily.   Marland Kitchen aspirin EC 81 MG tablet Take 1 tablet (81 mg total) by mouth daily. Swallow whole.  Marland Kitchen atorvastatin (LIPITOR) 80 MG tablet Take 80 mg by mouth daily.  . bictegravir-emtricitabine-tenofovir AF (BIKTARVY) 50-200-25 MG TABS tablet Take 1 tablet by mouth daily.  . diphenhydrAMINE (BENADRYL) 25 MG tablet Take 2 tablets (50 mg total) by mouth every 8 (eight) hours as needed (allergic reaction).  . fluticasone (FLONASE) 50 MCG/ACT nasal spray Place 2 sprays into both nostrils daily as needed for allergies.   Marland Kitchen loratadine  (CLARITIN) 10 MG tablet Take 10 mg by mouth daily.  Marland Kitchen losartan (COZAAR) 50 MG tablet Take 50 mg by mouth daily.  . meloxicam (MOBIC) 15 MG tablet Take 15 mg by mouth daily.  . metFORMIN (GLUCOPHAGE) 500 MG tablet Take 500 mg by mouth daily with breakfast.  . metoprolol tartrate (LOPRESSOR) 100 MG tablet Take 1 tablet by mouth once for procedure.  . nitroGLYCERIN (NITROSTAT) 0.4 MG SL tablet Place 1 tablet (0.4 mg total) under the tongue every 5 (five) minutes as needed.  . pantoprazole (PROTONIX) 40 MG tablet Take 40 mg by mouth daily before breakfast.  . terazosin (HYTRIN) 2 MG capsule Take 2 mg by mouth at bedtime.   Marland Kitchen testosterone cypionate (DEPOTESTOSTERONE CYPIONATE) 200 MG/ML injection Inject 200 mg into the muscle every 14 (fourteen) days.   . valACYclovir (VALTREX) 500 MG tablet TAKE 2 TABLETS (1,000 MG TOTAL) BY MOUTH DAILY.   Current Facility-Administered Medications for the 03/09/20 encounter (Office Visit) with Geralynn Rile, MD  Medication  . sodium chloride flush (NS) 0.9 % injection 3 mL     Allergies:    Black walnut pollen allergy skin test, Shellfish allergy, Other, and Penicillins   Social History: Social History   Socioeconomic History  . Marital status: Legally Separated    Spouse name: Not on file  . Number of children: 6  . Years of education: Not on file  . Highest education level: Not on file  Occupational History  . Occupation: [employer]   Tobacco Use  . Smoking status: Former Smoker    Years: 30.00    Quit date: 12/04/2003    Years since quitting: 16.2  . Smokeless tobacco: Never Used  . Tobacco comment: pt. no longer smokes  Vaping Use  . Vaping Use: Never used  Substance and Sexual Activity  . Alcohol use: Yes    Alcohol/week: 0.0 standard drinks    Comment: occasional  . Drug use: Not Currently    Comment: occasional  . Sexual activity: Yes    Partners: Female    Comment: pt. declined condoms  Other Topics Concern  . Not on file    Social History Narrative  . Not on file   Social Determinants of Health   Financial Resource Strain:   . Difficulty of Paying Living Expenses: Not on file  Food Insecurity:   . Worried About Charity fundraiser in the Last Year: Not on file  . Ran Out of Food in the Last Year: Not on file  Transportation Needs:   . Lack of Transportation (Medical): Not on file  . Lack of Transportation (Non-Medical): Not on file  Physical Activity:   . Days of Exercise per Week: Not on file  . Minutes of Exercise per Session: Not on file  Stress:   . Feeling of Stress : Not on file  Social Connections:   . Frequency of Communication with Friends and Family: Not on file  . Frequency of Social Gatherings with Friends and Family: Not on file  . Attends Religious Services: Not on file  . Active Member of Clubs or Organizations: Not on file  . Attends Archivist Meetings: Not on file  . Marital Status: Not on file     Family History: The patient's family history includes Arthritis in his mother and sister; Heart disease in his father; Hypertension in his brother, father, mother, and sister.  ROS:   All other ROS reviewed and negative. Pertinent positives noted in the HPI.     EKGs/Labs/Other Studies Reviewed:   The following studies were personally reviewed by me today:  EKG:  EKG is ordered today.  The ekg ordered today demonstrates normal sinus rhythm, heart rate 93, no acute ischemic changes, no evidence of prior infarction, and was personally reviewed by me.   TTE 02/02/2020 1. Left ventricular ejection fraction, by estimation, is 55 to 60%. The  left ventricle has normal function. The left ventricle has no regional  wall motion abnormalities. There is mild left ventricular hypertrophy.  Left ventricular diastolic parameters  were normal.  2. Right ventricular systolic function is normal. The right ventricular  size is normal.  3. The mitral valve is grossly normal. Trivial  mitral valve  regurgitation. No evidence of mitral stenosis.  4. The aortic valve is tricuspid. Aortic valve regurgitation is not  visualized. No aortic stenosis is present.  5. Aortic dilatation noted. There is borderline dilatation of the  ascending aorta, measuring 38 mm.  6. The inferior vena cava is normal in size with greater than 50%  respiratory variability, suggesting right atrial pressure of 3 mmHg.   CCTA 02/29/2020 1. Coronary calcium score of 195. This was 94th percentile for age and sex matched control.  2. Normal coronary origin with right dominance.  3. There is severe (>70%) stenosis in proximal D1. There is also significant plaque in the distal LAD. However this region cannot be well-visualized.  4. Study is limited by body habitus.  5. Consider cardiac catheterization for more definitive evaluation of the coronaries and concern for significant stenosis in the distal LAD, D1 and RI.  Recent Labs: 09/08/2019: ALT 21; Hemoglobin 15.7; Platelets 256 01/31/2020: BUN 12; Creatinine, Ser 1.26; Potassium 5.0; Sodium 137   Recent Lipid Panel    Component Value Date/Time   CHOL 133 09/08/2019 0905   TRIG 69 09/08/2019 0905   HDL 48 09/08/2019 0905   CHOLHDL 2.8 09/08/2019 0905   VLDL 13 12/28/2015 1135   LDLCALC 70 09/08/2019 0905    Physical Exam:   VS:  BP (!) 142/90   Pulse 93   Ht 5\' 11"  (1.803 m)   Wt (!) 399 lb 9.6 oz (181.3 kg)   SpO2 95%   BMI 55.73 kg/m    Wt Readings from Last 3 Encounters:  03/09/20 (!) 399 lb 9.6 oz (181.3 kg)  01/31/20 (!) 398 lb (180.5 kg)  11/16/19 (!) 385 lb (174.6 kg)    General: Well nourished, well developed, in no acute distress Heart: Atraumatic, normal size  Eyes: PEERLA, EOMI  Neck: Supple, no JVD Endocrine: No thryomegaly Cardiac: Normal S1, S2; RRR; no murmurs, rubs, or gallops Lungs: Clear to auscultation bilaterally, no wheezing, rhonchi or rales  Abd: Soft, nontender, no hepatomegaly  Ext: No  edema, pulses  2+ Musculoskeletal: No deformities, BUE and BLE strength normal and equal Skin: Warm and dry, no rashes   Neuro: Alert and oriented to person, place, time, and situation, CNII-XII grossly intact, no focal deficits  Psych: Normal mood and affect   ASSESSMENT:   Vincent Young is a 54 y.o. male who presents for the following: 1. Coronary artery disease of native artery of native heart with stable angina pectoris ([place])   2. Mixed hyperlipidemia   3. Primary hypertension     PLAN:   1. Coronary artery disease of native artery of native heart with stable angina pectoris ([place]) -Continues to have atypical chest pain.  EKG normal.  Echo normal.  Coronary CTA was extremely poor quality my opinion.  It was read as severe stenosis in the first diagonal branch.  The only way to determine what is actually going on is pursue left heart catheterization.  We will set him up for this.  He will give [place] a CBC and BMP today. -He was instructed to continue aspirin and statin.  He is on amlodipine which is an antianginal.  He also was given a prescription for nitroglycerin.  He was given strict return precautions including pain that lasts more than 3:15 doses of sublingual nitroglycerin, 3 to 5 minutes per dose.  He should then present in the emergency room.  We will set him up for left heart catheterization early next week.  2. Mixed hyperlipidemia -Most recent LDL cholesterol at goal.  Continue statin.  3. Primary hypertension -BP well controlled.  Continue current medications.   Disposition: Return in about 1 month (around 04/09/2020).  Medication Adjustments/Labs and Tests Ordered: Current medicines are reviewed at length with the patient today.  Concerns regarding medicines are outlined above.  Orders Placed This Encounter  Procedures  . Basic metabolic panel  . CBC   No orders of the defined types were placed in this encounter.   Patient Instructions     [place] [address] Dept: [phone] Loc: [place]  03/09/2020  You are scheduled for a Cardiac Catheterization on Tuesday, October 26th with Dr. Daneen Schick.  1. Please arrive at the [facility] (Main Entrance A) at [facility]: [address] [place], [place] [address] at 9:00 AM (This time is two hours before your procedure to ensure your preparation). Free valet parking service is available.   Special note: Every effort is made to have your procedure done on time. Please understand that emergencies sometimes delay scheduled procedures.  2. Diet: Do not eat solid foods after midnight.  The patient may have clear liquids until 5am upon the day of the procedure.  3. Labs: Today in office   Covid test: Friday, October 22nd at [place], [place]   4. Medication instructions in preparation for your procedure:   Contrast Allergy: No  Hold terazosin AM of procedure  Do not take Diabetes Med Glucophage (Metformin) on the day of the procedure and HOLD 48 HOURS AFTER THE PROCEDURE.  On the morning of your procedure, take your Aspirin and any morning medicines NOT listed above.  You may use sips of water.  5. Plan for one night stay--bring personal belongings. 6. Bring a current list of your medications and current insurance cards. 7. You MUST have a responsible person to drive you home. 8. Someone MUST be with you the first  24 hours after you arrive home or your discharge will be delayed. 9. Please wear clothes that are easy to get on and off and wear slip-on shoes.  Thank you for allowing [place] to care for you!   -- [facility]   Follow up in 1 month with Dr. Audie Box      Time Spent with Patient: I have spent a total of 35 minutes with patient reviewing hospital notes, telemetry, EKGs, labs and  examining the patient as well as establishing an assessment and plan that was discussed with the patient.  > 50% of time was spent in direct patient care.  Signed, [place]ison Naegeli. Audie Box, [place]  [address] [place], [place] [address] [phone]  03/09/2020 1:41 PM

## 2020-03-09 ENCOUNTER — Other Ambulatory Visit: Payer: Self-pay

## 2020-03-09 ENCOUNTER — Other Ambulatory Visit: Payer: Self-pay | Admitting: *Deleted

## 2020-03-09 ENCOUNTER — Ambulatory Visit: Payer: Medicare HMO | Admitting: Cardiovascular Disease

## 2020-03-09 ENCOUNTER — Encounter: Payer: Self-pay | Admitting: Cardiovascular Disease

## 2020-03-09 ENCOUNTER — Telehealth: Payer: Self-pay | Admitting: Cardiovascular Disease

## 2020-03-09 VITALS — BP 142/90 | HR 93 | Ht 71.0 in | Wt 399.6 lb

## 2020-03-09 DIAGNOSIS — E782 Mixed hyperlipidemia: Secondary | ICD-10-CM

## 2020-03-09 DIAGNOSIS — I25118 Atherosclerotic heart disease of native coronary artery with other forms of angina pectoris: Secondary | ICD-10-CM

## 2020-03-09 DIAGNOSIS — I10 Essential (primary) hypertension: Secondary | ICD-10-CM | POA: Diagnosis not present

## 2020-03-09 MED ORDER — SODIUM CHLORIDE 0.9% FLUSH: 3.0000 mL | Freq: Two times a day (BID) | INTRAVENOUS | Status: DC

## 2020-03-09 NOTE — Telephone Encounter (Signed)
Per pt does not have transportation for cath on the 03/14/20 .Cath rescheduled to 03/15/20 with Dr Ellyn Hack  at 10:00 am pt needs to be there at 8:00 am .Vincent Young

## 2020-03-09 NOTE — Patient Instructions (Addendum)
    Vincent Young [place] [address] Dept: [phone] Loc: [place]  03/09/2020  You are scheduled for a Cardiac Catheterization on Tuesday, October 26th with Dr. Daneen Schick.  1. Please arrive at the [facility] (Main Entrance A) at [facility]: [address] [place], [place] [address] at 9:00 AM (This time is two hours before your procedure to ensure your preparation). Free valet parking service is available.   Special note: Every effort is made to have your procedure done on time. Please understand that emergencies sometimes delay scheduled procedures.  2. Diet: Do not eat solid foods after midnight.  The patient may have clear liquids until 5am upon the day of the procedure.  3. Labs: Today in office   Covid test: Friday, October 22nd at [place], [place]   4. Medication instructions in preparation for your procedure:   Contrast Allergy: No  Hold terazosin AM of procedure  Do not take Diabetes Med Glucophage (Metformin) on the day of the procedure and HOLD 48 HOURS AFTER THE PROCEDURE.  On the morning of your procedure, take your Aspirin and any morning medicines NOT listed above.  You may use sips of water.  5. Plan for one night stay--bring personal belongings. 6. Bring a current list of your medications and current insurance cards. 7. You MUST have a responsible person to drive you home. 8. Someone MUST be with you the first 24 hours after you arrive home or your discharge will be delayed. 9. Please wear clothes that are easy to get on and off and wear slip-on shoes.  Thank you for allowing [place] to care for you!   -- [facility]   Follow up in 1 month with Dr. Audie Box

## 2020-03-09 NOTE — Telephone Encounter (Signed)
Patient states he needs to reschedule his procedure 02/2620. He states he could do the following day 03/15/20.

## 2020-03-09 NOTE — Addendum Note (Signed)
Addended by: Wonda Horner on: 03/09/2020 04:05 PM   Modules accepted: Orders

## 2020-03-10 ENCOUNTER — Other Ambulatory Visit (HOSPITAL_COMMUNITY): Payer: Medicare HMO

## 2020-03-10 LAB — BASIC METABOLIC PANEL
BUN/Creatinine Ratio: 8 — ABNORMAL LOW (ref 9–20)
BUN: 10 mg/dL (ref 6–24)
CO2: 21 mmol/L (ref 20–29)
Calcium: 8.9 mg/dL (ref 8.7–10.2)
Chloride: 103 mmol/L (ref 96–106)
Creatinine, Ser: 1.18 mg/dL (ref 0.76–1.27)
GFR calc Af Amer: 79 mL/min/{1.73_m2} (ref >59)
GFR calc non Af Amer: 69 mL/min/{1.73_m2} (ref >59)
Glucose: 110 mg/dL — ABNORMAL HIGH (ref 65–99)
Potassium: 5.1 mmol/L (ref 3.5–5.2)
Sodium: 140 mmol/L (ref 134–144)

## 2020-03-10 LAB — CBC
Hematocrit: 47.2 % (ref 37.5–51.0)
Hemoglobin: 15.9 g/dL (ref 13.0–17.7)
MCH: 32.3 pg (ref 26.6–33.0)
MCHC: 33.7 g/dL (ref 31.5–35.7)
MCV: 96 fL (ref 79–97)
Platelets: 262 10*3/uL (ref 150–450)
RBC: 4.93 x10E6/uL (ref 4.14–5.80)
RDW: 13.7 % (ref 11.6–15.4)
WBC: 7.7 10*3/uL (ref 3.4–10.8)

## 2020-03-10 NOTE — Telephone Encounter (Signed)
Patient is requesting to reschedule pre-procedure COVID screening for 03/13/2020 if possible. He states something came up today and is is unable to get tested.  Please advise.

## 2020-03-10 NOTE — Telephone Encounter (Signed)
Spoke with patient and r/s COVID screening from 10/22 to 10/25 at 0830 for cath on 10/27

## 2020-03-13 ENCOUNTER — Other Ambulatory Visit (HOSPITAL_COMMUNITY)
Admission: RE | Admit: 2020-03-13 | Discharge: 2020-03-13 | Disposition: A | Discharge status: Home / Self Care | Payer: Medicare HMO | Source: Ambulatory Visit | Attending: Cardiology | Admitting: Cardiology

## 2020-03-13 ENCOUNTER — Telehealth: Payer: Self-pay | Admitting: Cardiovascular Disease

## 2020-03-13 DIAGNOSIS — Z01812 Encounter for preprocedural laboratory examination: Secondary | ICD-10-CM | POA: Diagnosis present

## 2020-03-13 DIAGNOSIS — E291 Testicular hypofunction: Secondary | ICD-10-CM | POA: Diagnosis not present

## 2020-03-13 DIAGNOSIS — Z20822 Contact with and (suspected) exposure to covid-19: Secondary | ICD-10-CM | POA: Insufficient documentation

## 2020-03-13 LAB — SARS CORONAVIRUS 2 (TAT 6-24 HRS): SARS Coronavirus 2: NEGATIVE

## 2020-03-13 NOTE — Telephone Encounter (Signed)
*  STAT* If patient is at the pharmacy, call can be transferred to refill team.   1. Which medications need to be refilled? (please list name of each medication and dose if known) atorvastatin (LIPITOR) 80 MG tablet  2. Which pharmacy/location (including street and city if local pharmacy) is medication to be sent to? [id number] - [place], [place] - [address] AT [place]  3. Do they need a 30 day or 90 day supply? [place]

## 2020-03-14 ENCOUNTER — Telehealth: Payer: Self-pay | Admitting: *Deleted

## 2020-03-14 DIAGNOSIS — I10 Essential (primary) hypertension: Secondary | ICD-10-CM | POA: Diagnosis not present

## 2020-03-14 DIAGNOSIS — E7800 Pure hypercholesterolemia, unspecified: Secondary | ICD-10-CM | POA: Diagnosis not present

## 2020-03-14 DIAGNOSIS — K219 Gastro-esophageal reflux disease without esophagitis: Secondary | ICD-10-CM | POA: Diagnosis not present

## 2020-03-14 DIAGNOSIS — M170 Bilateral primary osteoarthritis of knee: Secondary | ICD-10-CM | POA: Diagnosis not present

## 2020-03-14 DIAGNOSIS — E1169 Type 2 diabetes mellitus with other specified complication: Secondary | ICD-10-CM | POA: Diagnosis not present

## 2020-03-14 DIAGNOSIS — J4541 Moderate persistent asthma with (acute) exacerbation: Secondary | ICD-10-CM | POA: Diagnosis not present

## 2020-03-14 DIAGNOSIS — E119 Type 2 diabetes mellitus without complications: Secondary | ICD-10-CM | POA: Diagnosis not present

## 2020-03-14 MED ORDER — ATORVASTATIN CALCIUM 80 MG PO TABS: 80.0000 mg | ORAL_TABLET | Freq: Every day | ORAL | 3 refills | Status: DC

## 2020-03-14 NOTE — Telephone Encounter (Signed)
Pt contacted pre-catheterization scheduled at [facility] for: Wednesday March 15, 2020 10 AM Verified arrival time and place: [facility]) at: 8 AM   No solid food after midnight prior to cath, clear liquids until 5 AM day of procedure.  Hold: Metformin-day of procedure and 48 hours post procedure  Except hold medications AM meds can be  taken pre-cath with sips of water including: ASA 81 mg   Confirmed patient has responsible adult to drive home post procedure and be with patient first 24 hours after arriving home: yes  You are allowed ONE visitor in the waiting room during the time you are at the hospital for your procedure. Both you and your visitor must wear a mask once you enter the hospital.       COVID-19 Pre-Screening Questions:  . In the past 14 days have you had a new cough, new headache, new nasal congestion, fever (100.4 or greater) unexplained body aches, new sore throat, or sudden loss of taste or sense of smell? no . In the past 14 days have you been around anyone with known Covid 19? no . Have you been vaccinated for COVID-19? Yes, see immunization history    Reviewed procedure/mask/visitor instructions, COVID-19 questions with patient.

## 2020-03-14 NOTE — Telephone Encounter (Signed)
Rx(s) sent to pharmacy electronically.  

## 2020-03-15 ENCOUNTER — Encounter (HOSPITAL_COMMUNITY)
Admission: RE | Disposition: A | Discharge status: Home / Self Care | Payer: Self-pay | Source: Ambulatory Visit | Attending: Cardiology

## 2020-03-15 ENCOUNTER — Other Ambulatory Visit: Payer: Self-pay

## 2020-03-15 ENCOUNTER — Ambulatory Visit (HOSPITAL_COMMUNITY)
Admission: RE | Admit: 2020-03-15 | Discharge: 2020-03-15 | Disposition: A | Discharge status: Home / Self Care | Payer: Medicare HMO | Source: Ambulatory Visit | Attending: Cardiology | Admitting: Cardiology

## 2020-03-15 ENCOUNTER — Encounter (HOSPITAL_COMMUNITY): Payer: Self-pay | Admitting: Cardiology

## 2020-03-15 DIAGNOSIS — E782 Mixed hyperlipidemia: Secondary | ICD-10-CM | POA: Insufficient documentation

## 2020-03-15 DIAGNOSIS — Z880 Allergy status to penicillin: Secondary | ICD-10-CM | POA: Insufficient documentation

## 2020-03-15 DIAGNOSIS — I25118 Atherosclerotic heart disease of native coronary artery with other forms of angina pectoris: Secondary | ICD-10-CM | POA: Diagnosis not present

## 2020-03-15 DIAGNOSIS — I10 Essential (primary) hypertension: Secondary | ICD-10-CM | POA: Diagnosis not present

## 2020-03-15 DIAGNOSIS — Z8249 Family history of ischemic heart disease and other diseases of the circulatory system: Secondary | ICD-10-CM | POA: Diagnosis not present

## 2020-03-15 DIAGNOSIS — Z91048 Other nonmedicinal substance allergy status: Secondary | ICD-10-CM | POA: Insufficient documentation

## 2020-03-15 DIAGNOSIS — I209 Angina pectoris, unspecified: Secondary | ICD-10-CM | POA: Diagnosis present

## 2020-03-15 DIAGNOSIS — Z87891 Personal history of nicotine dependence: Secondary | ICD-10-CM | POA: Insufficient documentation

## 2020-03-15 DIAGNOSIS — Z91018 Allergy to other foods: Secondary | ICD-10-CM | POA: Insufficient documentation

## 2020-03-15 DIAGNOSIS — R931 Abnormal findings on diagnostic imaging of heart and coronary circulation: Secondary | ICD-10-CM | POA: Diagnosis present

## 2020-03-15 DIAGNOSIS — Z91013 Allergy to seafood: Secondary | ICD-10-CM | POA: Insufficient documentation

## 2020-03-15 HISTORY — PX: LEFT HEART CATH AND CORONARY ANGIOGRAPHY: CATH118249

## 2020-03-15 LAB — GLUCOSE, CAPILLARY
Glucose-Capillary: 127 mg/dL — ABNORMAL HIGH (ref 70–99)
Glucose-Capillary: 103 mg/dL — ABNORMAL HIGH (ref 70–99)

## 2020-03-15 SURGERY — LEFT HEART CATH AND CORONARY ANGIOGRAPHY
Anesthesia: LOCAL

## 2020-03-15 MED ORDER — ACETAMINOPHEN 325 MG PO TABS: 650.0000 mg | ORAL_TABLET | ORAL | Status: DC | PRN

## 2020-03-15 MED ORDER — FENTANYL CITRATE (PF) 100 MCG/2ML IJ SOLN
INTRAMUSCULAR | Status: AC
  Filled 2020-03-15: qty 2

## 2020-03-15 MED ORDER — HYDRALAZINE HCL 20 MG/ML IJ SOLN: 10.0000 mg | INTRAMUSCULAR | Status: DC | PRN

## 2020-03-15 MED ORDER — MIDAZOLAM HCL 2 MG/2ML IJ SOLN
INTRAMUSCULAR | Status: AC
  Filled 2020-03-15: qty 2

## 2020-03-15 MED ORDER — MIDAZOLAM HCL 2 MG/2ML IJ SOLN
INTRAMUSCULAR | Status: DC | PRN
  Administered 2020-03-15: 1 mg via INTRAVENOUS

## 2020-03-15 MED ORDER — SODIUM CHLORIDE 0.9% FLUSH: 3.0000 mL | Freq: Two times a day (BID) | INTRAVENOUS | Status: DC

## 2020-03-15 MED ORDER — LIDOCAINE HCL (PF) 1 % IJ SOLN
INTRAMUSCULAR | Status: AC
  Filled 2020-03-15: qty 30

## 2020-03-15 MED ORDER — SODIUM CHLORIDE 0.9 % IV SOLN: 250.0000 mL | INTRAVENOUS | Status: DC | PRN

## 2020-03-15 MED ORDER — SODIUM CHLORIDE 0.9% FLUSH: 3.0000 mL | INTRAVENOUS | Status: DC | PRN

## 2020-03-15 MED ORDER — FENTANYL CITRATE (PF) 100 MCG/2ML IJ SOLN
INTRAMUSCULAR | Status: DC | PRN
  Administered 2020-03-15: 50 ug via INTRAVENOUS

## 2020-03-15 MED ORDER — ONDANSETRON HCL 4 MG/2ML IJ SOLN: 4.0000 mg | Freq: Four times a day (QID) | INTRAMUSCULAR | Status: DC | PRN

## 2020-03-15 MED ORDER — LIDOCAINE HCL (PF) 1 % IJ SOLN
INTRAMUSCULAR | Status: DC | PRN
  Administered 2020-03-15: 2 mL

## 2020-03-15 MED ORDER — HEPARIN SODIUM (PORCINE) 1000 UNIT/ML IJ SOLN
INTRAMUSCULAR | Status: AC
  Filled 2020-03-15: qty 1

## 2020-03-15 MED ORDER — LABETALOL HCL 5 MG/ML IV SOLN: 10.0000 mg | INTRAVENOUS | Status: DC | PRN

## 2020-03-15 MED ORDER — HEPARIN SODIUM (PORCINE) 1000 UNIT/ML IJ SOLN
INTRAMUSCULAR | Status: DC | PRN
  Administered 2020-03-15: 9000 [IU] via INTRAVENOUS

## 2020-03-15 MED ORDER — VERAPAMIL HCL 2.5 MG/ML IV SOLN
INTRAVENOUS | Status: DC | PRN
  Administered 2020-03-15: 10 mL via INTRA_ARTERIAL

## 2020-03-15 MED ORDER — SODIUM CHLORIDE 0.9 % WEIGHT BASED INFUSION: 1.0000 mL/kg/h | INTRAVENOUS | Status: DC

## 2020-03-15 MED ORDER — HEPARIN (PORCINE) IN NACL 1000-0.9 UT/500ML-% IV SOLN
INTRAVENOUS | Status: AC
  Filled 2020-03-15: qty 1000

## 2020-03-15 MED ORDER — ASPIRIN 81 MG PO CHEW: 81.0000 mg | CHEWABLE_TABLET | ORAL | Status: DC

## 2020-03-15 MED ORDER — HEPARIN (PORCINE) IN NACL 1000-0.9 UT/500ML-% IV SOLN
INTRAVENOUS | Status: DC | PRN
  Administered 2020-03-15: 500 mL

## 2020-03-15 MED ORDER — IOHEXOL 350 MG/ML SOLN
INTRAVENOUS | Status: DC | PRN
  Administered 2020-03-15: 110 mL

## 2020-03-15 MED ORDER — SODIUM CHLORIDE 0.9 % WEIGHT BASED INFUSION
3.0000 mL/kg/h | INTRAVENOUS | Status: AC
  Administered 2020-03-15: 3 mL/kg/h via INTRAVENOUS

## 2020-03-15 SURGICAL SUPPLY — 11 items
CATH LAUNCHER 6FR EBU3.5 (CATHETERS) ×2 IMPLANT
CATH OPTITORQUE TIG 4.0 5F (CATHETERS) ×2 IMPLANT
DEVICE RAD COMP TR BAND LRG (VASCULAR PRODUCTS) ×2 IMPLANT
GLIDESHEATH SLEND SS 6F .021 (SHEATH) ×2 IMPLANT
GUIDEWIRE INQWIRE 1.5J.035X260 (WIRE) ×1 IMPLANT
INQWIRE 1.5J .035X260CM (WIRE) ×2
KIT HEART LEFT (KITS) ×2 IMPLANT
MAT PREVALON FULL STRYKER (MISCELLANEOUS) ×2 IMPLANT
PACK CARDIAC CATHETERIZATION (CUSTOM PROCEDURE TRAY) ×2 IMPLANT
TRANSDUCER W/STOPCOCK (MISCELLANEOUS) ×2 IMPLANT
TUBING CIL FLEX 10 FLL-RA (TUBING) ×2 IMPLANT

## 2020-03-15 NOTE — Brief Op Note (Signed)
   BRIEF CARDIAC CATH NOTE  03/15/2020 10:58 AM  PROCEDURE:  Procedure(s): LEFT HEART CATH AND CORONARY ANGIOGRAPHY (N/A)  SURGEON:  Surgeon(s) and Role:    * Leonie Man, MD - Primary  PATIENT:  Vincent Young  54 y.o. male with daily chest pain somewhat atypical for angina, but with some typical features.  He was evaluated with a coronary CTA which showed possible severe LAD and diagonal disease.  He is therefore referred for invasive evaluation with cardiac catheterization.  PRE-OPERATIVE DIAGNOSIS:  positive coronary CT  POST-OPERATIVE DIAGNOSIS:    Angiographically minimal disease, apical LAD tapers to roughly 50%, D1 has roughly 45% eccentric lesion.  Normal LVEDP  ANESTHESIA:   local and IV sedation -4 mL subcu lidocaine, 1 mg Versed, 50 mcg fentanyl.  Time Out: Verified patient identification, verified procedure, site/side was marked, verified correct patient position, special equipment/implants available, medications/allergies/relevent history reviewed, required imaging and test results available. Performed.  Access:  . RIGHT Radial Artery: 6 Fr sheath -- Seldinger technique using Micropuncture Kit -- Direct ultrasound guidance used.  Permanent image obtained and placed on chart. -- 10 mL radial cocktail IA; 9000 Units IV Heparin  Left Heart Catheterization: 5Fr Catheters advanced or exchanged over a J-wire under direct fluoroscopic guidance into the ascending aorta; TIG 4.0 catheter advanced first.  . * LV Hemodynamics (LV Gram): EBU 3.5 Guide Catheter . * Left Coronary Artery Cineangiography: EBU 3.5 Guide Catheter . * Right Coronary Artery Cineangiography: TIG 4.0 Catheter   Review of initial angiography revealed: Minimal CAD   Upon completion of Angiogaphy, the catheter was removed completely out of the body over a wire, without complication.  Radial sheath removed in the Cardiac Catheterization lab with TR Band placed for hemostasis.  TR Band: Placed for  radial access sheath removal  MEDICATIONS . SQ Lidocaine 4 mL . Radial Cocktail: 3 mg Verapmil in 10 mL NS . Heparin: 9000 Units  EBL:  <20 mL   COUNTS:  YES  DICTATION: .Note written in EPIC  PLAN OF CARE: Discharge to home after PACU  PATIENT DISPOSITION:  PACU - hemodynamically stable.   Delay start of Pharmacological VTE agent (>24hrs) due to surgical blood loss or risk of bleeding: not applicable   Glenetta Hew, MD

## 2020-03-15 NOTE — Interval H&P Note (Signed)
History and Physical Interval Note:  03/15/2020 9:57 AM  Vincent Young  has presented today for surgery, with the diagnosis of positive coronary CT.  The various methods of treatment have been discussed with the patient and family. After consideration of risks, benefits and other options for treatment, the patient has consented to  Procedure(s): LEFT HEART CATH AND CORONARY ANGIOGRAPHY (N/A)  PERCUTANEOUS CORONARY INTERVENTION   as a surgical intervention.  The patient's history has been reviewed, patient examined, no change in status, stable for surgery.  I have reviewed the patient's chart and labs.  Questions were answered to the patient's satisfaction.    Cath Lab Visit (complete for each Cath Lab visit)  Clinical Evaluation Leading to the Procedure:   ACS: No.  Non-ACS:    Anginal Classification: CCS III  Anti-ischemic medical therapy: Maximal Therapy (2 or more classes of medications)  Non-Invasive Test Results: High-risk stress test findings: cardiac mortality >3%/year  Prior CABG: No previous CABG    Glenetta Hew

## 2020-03-15 NOTE — Discharge Instructions (Signed)
Drink plenty of fluid for 48 hours and keep wrist elevated at heart level for 24 hours  Radial Site Care   This sheet gives you information about how to care for yourself after your procedure. Your health care provider may also give you more specific instructions. If you have problems or questions, contact your health care provider. What can I expect after the procedure? After the procedure, it is common to have:  Bruising and tenderness at the catheter insertion area. Follow these instructions at home: Medicines  Take over-the-counter and prescription medicines only as told by your health care provider. Insertion site care 1. Follow instructions from your health care provider about how to take care of your insertion site. Make sure you: ? Wash your hands with soap and water before you change your bandage (dressing). If soap and water are not available, use hand sanitizer. ? remove your dressing as told by your health care provider. In 24 hours 2. Check your insertion site every day for signs of infection. Check for: ? Redness, swelling, or pain. ? Fluid or blood. ? Pus or a bad smell. ? Warmth. 3. Do not take baths, swim, or use a hot tub until your health care provider approves. 4. You may shower 24-48 hours after the procedure, or as directed by your health care provider. ? Remove the dressing and gently wash the site with plain soap and water. ? Pat the area dry with a clean towel. ? Do not rub the site. That could cause bleeding. 5. Do not apply powder or lotion to the site. Activity   1. For 24 hours after the procedure, or as directed by your health care provider: ? Do not flex or bend the affected arm. ? Do not push or pull heavy objects with the affected arm. ? Do not drive yourself home from the hospital or clinic. You may drive 24 hours after the procedure unless your health care provider tells you not to. ? Do not operate machinery or power tools. 2. Do not lift  anything that is heavier than 10 lb (4.5 kg), or the limit that you are told, until your health care provider says that it is safe. For 4 days 3. Ask your health care provider when it is okay to: ? Return to work or school. ? Resume usual physical activities or sports. ? Resume sexual activity. General instructions  If the catheter site starts to bleed, raise your arm and put firm pressure on the site. If the bleeding does not stop, get help right away. This is a medical emergency.  If you went home on the same day as your procedure, a responsible adult should be with you for the first 24 hours after you arrive home.  Keep all follow-up visits as told by your health care provider. This is important. Contact a health care provider if:  You have a fever.  You have redness, swelling, or yellow drainage around your insertion site. Get help right away if:  You have unusual pain at the radial site.  The catheter insertion area swells very fast.  The insertion area is bleeding, and the bleeding does not stop when you hold steady pressure on the area.  Your arm or hand becomes pale, cool, tingly, or numb. These symptoms may represent a serious problem that is an emergency. Do not wait to see if the symptoms will go away. Get medical help right away. Call your local emergency services (911 in the [place] Do not   drive yourself to the hospital. Summary  After the procedure, it is common to have bruising and tenderness at the site.  Follow instructions from your health care provider about how to take care of your radial site wound. Check the wound every day for signs of infection.  Do not lift anything that is heavier than 10 lb (4.5 kg), or the limit that you are told, until your health care provider says that it is safe. This information is not intended to replace advice given to you by your health care provider. Make sure you discuss any questions you have with your health care  provider. Document Revised: 06/11/2017 Document Reviewed: 06/11/2017 Elsevier Patient Education  2020 Elsevier Inc.  

## 2020-03-20 ENCOUNTER — Other Ambulatory Visit: Payer: Medicare HMO

## 2020-03-20 ENCOUNTER — Other Ambulatory Visit: Payer: Self-pay

## 2020-03-20 ENCOUNTER — Ambulatory Visit (HOSPITAL_BASED_OUTPATIENT_CLINIC_OR_DEPARTMENT_OTHER): Payer: Medicare HMO | Attending: Cardiovascular Disease | Admitting: Cardiovascular Disease

## 2020-03-20 DIAGNOSIS — G4733 Obstructive sleep apnea (adult) (pediatric): Secondary | ICD-10-CM | POA: Diagnosis not present

## 2020-03-20 DIAGNOSIS — R0902 Hypoxemia: Secondary | ICD-10-CM | POA: Insufficient documentation

## 2020-03-20 DIAGNOSIS — G4730 Sleep apnea, unspecified: Secondary | ICD-10-CM | POA: Diagnosis not present

## 2020-03-20 MED FILL — BIKTARVY 50-200-25 MG TABS: 50-200-25 | 30 days supply | Qty: 30 | Fill #5

## 2020-03-21 ENCOUNTER — Other Ambulatory Visit: Payer: Medicare HMO

## 2020-03-21 DIAGNOSIS — B20 Human immunodeficiency virus [HIV] disease: Secondary | ICD-10-CM

## 2020-03-21 DIAGNOSIS — R69 Illness, unspecified: Secondary | ICD-10-CM | POA: Diagnosis not present

## 2020-03-22 ENCOUNTER — Other Ambulatory Visit (HOSPITAL_BASED_OUTPATIENT_CLINIC_OR_DEPARTMENT_OTHER): Payer: Self-pay

## 2020-03-22 ENCOUNTER — Other Ambulatory Visit: Payer: Self-pay

## 2020-03-22 ENCOUNTER — Encounter: Payer: Self-pay | Admitting: Podiatry

## 2020-03-22 ENCOUNTER — Ambulatory Visit (INDEPENDENT_AMBULATORY_CARE_PROVIDER_SITE_OTHER): Payer: Medicare HMO | Admitting: Podiatry

## 2020-03-22 DIAGNOSIS — L600 Ingrowing nail: Secondary | ICD-10-CM

## 2020-03-22 DIAGNOSIS — G4730 Sleep apnea, unspecified: Secondary | ICD-10-CM

## 2020-03-22 LAB — T-HELPER CELL (CD4) - (RCID CLINIC ONLY)
CD4 % Helper T Cell: 34 % (ref 33–65)
CD4 T Cell Abs: 672 /uL (ref 400–1790)

## 2020-03-22 MED ORDER — NEOMYCIN-POLYMYXIN-HC 3.5-10000-1 OT SOLN: OTIC | 0 refills | Status: DC

## 2020-03-22 NOTE — Progress Notes (Signed)
Subjective:   Patient ID: Vincent Young, male   DOB: 54 y.o.   MRN: [id number]   HPI Patient states that he wants something more definitive done for his big toe right over left.  States that it is been sore in the second toes also been sore and states he tries to trim it and do other treatments without relief of symptoms.   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  Patient wants nails removed and I did explain that if were going to remove them due to the longstanding nature of deformity that permanent procedure would be best.  Patient wants surgery and at this point is willing to accept risk and will do 1 foot at a time and today we will get a focus on the right and I infiltrated the right hallux and second toe with 120 mg like Marcaine mixture sterile prep was done and using sterile instrumentation I remove the hallux and second nails exposed matrix and applied phenol 5 applications 30 seconds followed by alcohol lavage sterile dressing and gave instructions on soaks and to leave dressings on 24 hours but take them off earlier if any throbbing were to occur.  Patient will be seen back for correction of the left and is encouraged to call with questions concerns

## 2020-03-22 NOTE — Patient Instructions (Signed)
Place 1/4 cup of epsom salts in a quart of warm tap water.  Submerge your foot or feet in the solution and soak for 20 minutes.  This soak should be done twice a day.  Next, remove your foot or feet from solution, blot dry the affected area. Apply ointment and cover if instructed by your doctor.   IF YOUR SKIN BECOMES IRRITATED WHILE USING THESE INSTRUCTIONS, IT IS OKAY TO SWITCH TO  WHITE VINEGAR AND WATER.  As another alternative soak, you may use antibacterial soap and water.  Monitor for any signs/symptoms of infection. Call the office immediately if any occur or go directly to the emergency room. Call with any questions/concerns.  Ingrown Toenail An ingrown toenail occurs when the corner or sides of a toenail grow into the surrounding skin. This causes discomfort and pain. The big toe is most commonly affected, but any of the toes can be affected. If an ingrown toenail is not treated, it can become infected. What are the causes? This condition may be caused by:  Wearing shoes that are too small or tight.  An injury, such as stubbing your toe or having your toe stepped on.  Improper cutting or care of your toenails.  Having nail or foot abnormalities that were present from birth (congenital abnormalities), such as having a nail that is too big for your toe. What increases the risk? The following factors may make you more likely to develop ingrown toenails:  Age. Nails tend to get thicker with age, so ingrown nails are more common among older people.  Cutting your toenails incorrectly, such as cutting them very short or cutting them unevenly. An ingrown toenail is more likely to get infected if you have:  Diabetes.  Blood flow (circulation) problems. What are the signs or symptoms? Symptoms of an ingrown toenail may include:  Pain, soreness, or tenderness.  Redness.  Swelling.  Hardening of the skin that surrounds the toenail. Signs that an ingrown toenail may be infected  include:  Fluid or pus.  Symptoms that get worse instead of better. How is this diagnosed? An ingrown toenail may be diagnosed based on your medical history, your symptoms, and a physical exam. If you have fluid or blood coming from your toenail, a sample may be collected to test for the specific type of bacteria that is causing the infection. How is this treated? Treatment depends on how severe your ingrown toenail is. You may be able to care for your toenail at home.  If you have an infection, you may be prescribed antibiotic medicines.  If you have fluid or pus draining from your toenail, your health care provider may drain it.  If you have trouble walking, you may be given crutches to use.  If you have a severe or infected ingrown toenail, you may need a procedure to remove part or all of the nail. Follow these instructions at home: Foot care   Do not pick at your toenail or try to remove it yourself.  Soak your foot in warm, soapy water. Do this for 20 minutes, 3 times a day, or as often as told by your health care provider. This helps to keep your toe clean and keep your skin soft.  Wear shoes that fit well and are not too tight. Your health care provider may recommend that you wear open-toed shoes while you heal.  Trim your toenails regularly and carefully. Cut your toenails straight across to prevent injury to the skin at the   corners of the toenail. Do not cut your nails in a curved shape.  Keep your feet clean and dry to help prevent infection. Medicines  Take over-the-counter and prescription medicines only as told by your health care provider.  If you were prescribed an antibiotic, take it as told by your health care provider. Do not stop taking the antibiotic even if you start to feel better. Activity  Return to your normal activities as told by your health care provider. Ask your health care provider what activities are safe for you.  Avoid activities that cause  pain. General instructions  If your health care provider told you to use crutches to help you move around, use them as instructed.  Keep all follow-up visits as told by your health care provider. This is important. Contact a health care provider if:  You have more redness, swelling, pain, or other symptoms that do not improve with treatment.  You have fluid, blood, or pus coming from your toenail. Get help right away if:  You have a red streak on your skin that starts at your foot and spreads up your leg.  You have a fever. Summary  An ingrown toenail occurs when the corner or sides of a toenail grow into the surrounding skin. This causes discomfort and pain. The big toe is most commonly affected, but any of the toes can be affected.  If an ingrown toenail is not treated, it can become infected.  Fluid or pus draining from your toenail is a sign of infection. Your health care provider may need to drain it. You may be given antibiotics to treat the infection.  Trimming your toenails regularly and properly can help you prevent an ingrown toenail. This information is not intended to replace advice given to you by your health care provider. Make sure you discuss any questions you have with your health care provider. Document Revised: 08/28/2018 Document Reviewed: 01/22/2017 Elsevier Patient Education  2020 Elsevier Inc.  

## 2020-03-24 LAB — HIV-1 RNA QUANT-NO REFLEX-BLD
HIV 1 RNA Quant: 73 Copies/mL — ABNORMAL HIGH
HIV-1 RNA Quant, Log: 1.87 Log cps/mL — ABNORMAL HIGH

## 2020-03-28 DIAGNOSIS — E291 Testicular hypofunction: Secondary | ICD-10-CM | POA: Diagnosis not present

## 2020-03-29 ENCOUNTER — Encounter (HOSPITAL_BASED_OUTPATIENT_CLINIC_OR_DEPARTMENT_OTHER): Payer: Self-pay | Admitting: Cardiovascular Disease

## 2020-03-29 NOTE — Procedures (Signed)
    Patient Name: Vincent Young, Almas Date: 03/20/2020 Gender: Male D.O.B: 1965-10-09 Age (years): 54 Referring Provider: Cassie Freer ONeal Height (inches): 71 Interpreting Physician: Shelva Majestic MD, ABSM Weight (lbs): 399 RPSGT: Jacolyn Reedy BMI: 56 MRN: [id number] Neck Size: 18.00  CLINICAL INFORMATION Sleep Study Type: HST  Indication for sleep study: snoring, fatigue, super morbid obesity  Epworth Sleepiness Score: 3  SLEEP STUDY TECHNIQUE A multi-channel overnight portable sleep study was performed. The channels recorded were: nasal airflow, thoracic respiratory movement, and oxygen saturation with a pulse oximetry. Snoring was also monitored.  MEDICATIONS albuterol (PROVENTIL HFA;VENTOLIN HFA) 108 (90 BASE) MCG/ACT inhaler amLODipine (NORVASC) 5 MG tablet aspirin EC 81 MG tablet atorvastatin (LIPITOR) 80 MG tablet bictegravir-emtricitabine-tenofovir AF (BIKTARVY) 50-200-25 MG TABS tablet diphenhydrAMINE (BENADRYL) 25 MG tablet fluticasone (FLONASE) 50 MCG/ACT nasal spray loratadine (CLARITIN) 10 MG tablet losartan (COZAAR) 50 MG tablet meloxicam (MOBIC) 15 MG tablet metFORMIN (GLUCOPHAGE) 500 MG tablet metoprolol tartrate (LOPRESSOR) 100 MG tablet neomycin-polymyxin-hydrocortisone (CORTISPORIN) OTIC solution nitroGLYCERIN (NITROSTAT) 0.4 MG SL tablet pantoprazole (PROTONIX) 40 MG tablet terazosin (HYTRIN) 2 MG capsule testosterone cypionate (DEPOTESTOSTERONE CYPIONATE) 200 MG/ML injection valACYclovir (VALTREX) 500 MG tablet  Patient self administered medications include: N/A.  SLEEP ARCHITECTURE Patient was studied for 493.6 minutes. The sleep efficiency was 100.0 % and the patient was supine for 28.3%. The arousal index was 0.0 per hour.  RESPIRATORY PARAMETERS The overall AHI was 77.7 per hour, with a central apnea index of 0.0 per hour.  The oxygen nadir was 54% during sleep.  CARDIAC DATA Mean heart rate during sleep was 81.9  bpm.  IMPRESSIONS - Severe obstructive sleep apnea occurred during this study (AHI = 77.7/h). - No significant central sleep apnea occurred during this study (CAI = 0.0/h). - Severe oxygen desaturation to a nadir of 54%. Time spent < 89% was 263.3 minutes - Patient snored 3.8% during the sleep.  DIAGNOSIS - Obstructive Sleep Apnea (G47.33) - Nocturnal Hypoxemia (G47.36)  RECOMMENDATIONS - In this patient with cardiovascular co-morbidities and very severe sleep apnea with marked nocturnal hypoxemia, recommend an in-lab CPAP titration for optimal evaluation and treatment of his severe sleep breathing disorder. - Effort should be made to optimize nasal and oropharyngeal patency. - Positional therapy avoiding supine position during sleep. - Avoid alcohol, sedatives and other CNS depressants that may worsen sleep apnea and disrupt normal sleep architecture. - Sleep hygiene should be reviewed to assess factors that may improve sleep quality. - Weight management (BMI 56) and regular exercise should be initiated or continued. - Recommend a download and sleep clinic evaluation after CPAP/BiPAP initiation   [Electronically signed] 03/29/2020 06:18 PM  Shelva Majestic MD, [facility], ABSM Diplomate, American Board of Sleep Medicine   NPI: [id number] [place] PH: [phone]   FX: [phone] [place]

## 2020-03-30 ENCOUNTER — Telehealth: Payer: Self-pay | Admitting: *Deleted

## 2020-03-30 DIAGNOSIS — G4730 Sleep apnea, unspecified: Secondary | ICD-10-CM

## 2020-03-30 NOTE — Telephone Encounter (Signed)
-----   Message from Troy Sine, MD sent at 03/29/2020  6:59 PM EST ----- Gae Bon, please notify pt and set up for in-lab titration study

## 2020-03-30 NOTE — Telephone Encounter (Signed)
Informed patient of sleep study results and patient understanding was verbalized. Patient understands his sleep study showed   IMPRESSIONS - Severe obstructive sleep apnea occurred during this study (AHI = 77.7/h). - Severe oxygen desaturation to a nadir of 54%. Time spent < 89% was 263.3 minutes - Patient snored 3.8% during the sleep.  DIAGNOSIS - Obstructive Sleep Apnea (G47.33) - Nocturnal Hypoxemia (G47.36)  RECOMMENDATIONS - In this patient with cardiovascular co-morbidities and very severe sleep apnea with marked nocturnal hypoxemia, recommend an in-lab CPAP titration for optimal evaluation and treatment of his severe sleep breathing disorder.  titration sent to sleep pool Pt is aware and agreeable to his results

## 2020-04-03 ENCOUNTER — Ambulatory Visit: Payer: Medicare HMO

## 2020-04-03 ENCOUNTER — Encounter: Payer: Medicare HMO | Admitting: Internal Medicine

## 2020-04-05 ENCOUNTER — Ambulatory Visit: Payer: Medicare HMO

## 2020-04-05 ENCOUNTER — Encounter: Payer: Self-pay | Admitting: Internal Medicine

## 2020-04-05 ENCOUNTER — Ambulatory Visit (INDEPENDENT_AMBULATORY_CARE_PROVIDER_SITE_OTHER): Payer: Medicare HMO | Admitting: Internal Medicine

## 2020-04-05 ENCOUNTER — Other Ambulatory Visit: Payer: Self-pay

## 2020-04-05 VITALS — BP 151/93 | HR 92 | Temp 98.0°F | Resp 16 | Ht 71.0 in | Wt >= 6400 oz

## 2020-04-05 DIAGNOSIS — B20 Human immunodeficiency virus [HIV] disease: Secondary | ICD-10-CM | POA: Diagnosis not present

## 2020-04-05 DIAGNOSIS — Z113 Encounter for screening for infections with a predominantly sexual mode of transmission: Secondary | ICD-10-CM

## 2020-04-05 DIAGNOSIS — Z23 Encounter for immunization: Secondary | ICD-10-CM

## 2020-04-05 DIAGNOSIS — R69 Illness, unspecified: Secondary | ICD-10-CM | POA: Diagnosis not present

## 2020-04-06 ENCOUNTER — Encounter: Payer: Self-pay | Admitting: Internal Medicine

## 2020-04-06 DIAGNOSIS — Z23 Encounter for immunization: Secondary | ICD-10-CM | POA: Insufficient documentation

## 2020-04-06 NOTE — Assessment & Plan Note (Signed)
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   rtc in 6 months.

## 2020-04-06 NOTE — Progress Notes (Signed)
   Subjective:    Patient ID: Vincent Young, male    DOB: 1966-03-03, 54 y.o.   MRN: [id number]  HPI Here for follow up of HIV He is on Symtuza and Tivicay and endorses only about 5 missed doses in 6 months.   CD4 good at 672, viral load 73. He is interested in injectable ARVs.  He received both COVID vaccines and is interested in the booster.  Unfortunately, his brother who has been in the NH following his stroke, died (expected).     Review of Systems  Constitutional: Negative for fatigue.  Gastrointestinal: Negative for diarrhea and nausea.       Objective:   Physical Exam Constitutional:      Appearance: Normal appearance.  Eyes:     General: No scleral icterus. Pulmonary:     Effort: Pulmonary effort is normal.  Skin:    Findings: No rash.  Neurological:     Mental Status: He is alert.  Psychiatric:        Mood and Affect: Mood normal.   SH: no tobacco       Assessment & Plan:

## 2020-04-06 NOTE — Assessment & Plan Note (Signed)
Flu shot today and will get him scheduled for [place] #3.

## 2020-04-07 ENCOUNTER — Encounter: Payer: Self-pay | Admitting: Cardiovascular Disease

## 2020-04-07 ENCOUNTER — Ambulatory Visit (INDEPENDENT_AMBULATORY_CARE_PROVIDER_SITE_OTHER): Payer: Medicare HMO | Admitting: Cardiovascular Disease

## 2020-04-07 ENCOUNTER — Ambulatory Visit (INDEPENDENT_AMBULATORY_CARE_PROVIDER_SITE_OTHER): Payer: Medicare HMO

## 2020-04-07 ENCOUNTER — Other Ambulatory Visit: Payer: Self-pay

## 2020-04-07 VITALS — BP 150/86 | HR 92 | Ht 71.0 in | Wt >= 6400 oz

## 2020-04-07 DIAGNOSIS — K219 Gastro-esophageal reflux disease without esophagitis: Secondary | ICD-10-CM

## 2020-04-07 DIAGNOSIS — E782 Mixed hyperlipidemia: Secondary | ICD-10-CM | POA: Diagnosis not present

## 2020-04-07 DIAGNOSIS — G4730 Sleep apnea, unspecified: Secondary | ICD-10-CM

## 2020-04-07 DIAGNOSIS — Z23 Encounter for immunization: Secondary | ICD-10-CM | POA: Diagnosis not present

## 2020-04-07 DIAGNOSIS — I2510 Atherosclerotic heart disease of native coronary artery without angina pectoris: Secondary | ICD-10-CM | POA: Diagnosis not present

## 2020-04-07 MED ORDER — OMEPRAZOLE 20 MG PO CPDR: 20.0000 mg | DELAYED_RELEASE_CAPSULE | Freq: Every day | ORAL | 11 refills | Status: DC

## 2020-04-07 NOTE — Patient Instructions (Signed)
Medication Instructions:  Your physician has recommended you make the following change in your medication:  1. Start Prilosec one tablet by mouth ( 20 mg ) daily sent in today # 30 to requested pharmacy.    *If you need a refill on your cardiac medications before your next appointment, please call your pharmacy*   Lab Work: None If you have labs (blood work) drawn today and your tests are completely normal, you will receive your results only by: Marland Kitchen MyChart Message (if you have MyChart) OR . A paper copy in the mail If you have any lab test that is abnormal or we need to change your treatment, we will call you to review the results.   Testing/Procedures: None   Follow-Up: At [facility], you and your health needs are our priority.  As part of our continuing mission to provide you with exceptional heart care, we have created designated Provider Care Teams.  These Care Teams include your primary Cardiologist (physician) and Advanced Practice Providers (APPs -  Physician Assistants and Nurse Practitioners) who all work together to provide you with the care you need, when you need it.  We recommend signing up for the patient portal called "MyChart".  Sign up information is provided on this After Visit Summary.  MyChart is used to connect with patients for Virtual Visits (Telemedicine).  Patients are able to view lab/test results, encounter notes, upcoming appointments, etc.  Non-urgent messages can be sent to your provider as well.   To learn more about what you can do with MyChart, go to [email or web address].    Your next appointment:   1 year(s)  The format for your next appointment:   In Person  Provider:   Eleonore Chiquito, MD   Other Instructions None

## 2020-04-07 NOTE — Progress Notes (Signed)
Cardiology Office Note:   Date:  04/07/2020  NAME:  Vincent Young    MRN: [id number] DOB:  [birth year]   PCP:  Vernie Shanks, MD  Cardiologist:  No primary care provider on file.   Referring MD: Vernie Shanks, MD   Chief Complaint  Patient presents with  . Coronary Artery Disease   History of Present Illness:   Vincent Young is a 55 y.o. male with a hx of diabetes, HIV, hyperlipidemia who presents for follow-up.  Underwent left heart catheterization due to abnormal cardiac CTA.  Had minimal nonobstructive disease in the distal LAD and first diagonal branch.  Reports he is doing well since the cath.  No issues at the right radial site.  He reports he still having episodes of sharp chest pain.  Can occur at any time.  He was on Protonix in the past and he needs to get back on Prilosec.  His cath showed no significant obstructive disease.  He was also diagnosed with severe sleep apnea.  We are waiting for him to have his in lab sleep titration.  Blood pressure is slightly elevated today.  150/86.  I suspect a lot of this is untreated sleep apnea causing issues.  His most recent lipid profile shows an LDL cholesterol of 60.  Triglycerides 65.  He is diabetic but well controlled on Lipitor.  Problem List 1. DM -A1c 6.6 2. HLD -Total cholesterol 114, HDL 41, LDL 60, triglycerides 65 3. HTN 4. HIV 5. CAD -CAC score 195 (94th percentile) -50% D1 and mid LAD 6. Severe OSA  Past Medical History: Past Medical History:  Diagnosis Date  . Acute renal failure (HCC)    due to dehydration  . Arthritis   . Asthma   . Coronary artery disease   . Diabetes mellitus without complication ([place])   . DJD (degenerative joint disease)   . Exposure to TB   . Family history of adverse reaction to anesthesia    " My brother has had PONV"  . GERD (gastroesophageal reflux disease)   . Headache   . HIV infection ([place])   . Hypertension   . Obesity   . Pilonidal cyst   . Recurrent genital HSV  (herpes simplex virus) infection   . Sleep apnea    never tested-has s/s    Past Surgical History: Past Surgical History:  Procedure Laterality Date  . APPENDECTOMY    . 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 COLONOSCOPY W/ BIOPSIES AND POLYPECTOMY    . COLONOSCOPY WITH PROPOFOL N/A 08/29/2014   Procedure: COLONOSCOPY WITH PROPOFOL;  Surgeon: Clarene Essex, MD;  Location: [facility] ENDOSCOPY;  Service: Endoscopy;  Laterality: N/A;  . COLONOSCOPY WITH PROPOFOL N/A 11/16/2019   Procedure: COLONOSCOPY WITH PROPOFOL;  Surgeon: Clarene Essex, MD;  Location: [facility] ENDOSCOPY;  Service: Endoscopy;  Laterality: N/A;  . HERNIA REPAIR     inguinal as a child  . LEFT HEART CATH AND CORONARY ANGIOGRAPHY N/A 03/15/2020   Procedure: LEFT HEART CATH AND CORONARY ANGIOGRAPHY;  Surgeon: Leonie Man, MD;  Location: [facility] CV LAB;  Service: Cardiovascular;  Laterality: N/A;  . PEG TUBE PLACEMENT  2009  . POLYPECTOMY  11/16/2019   Procedure: POLYPECTOMY;  Surgeon: Clarene Essex, MD;  Location: [facility]  ENDOS[facility];  Service: Endoscopy;;  . TONSILLECTOMY    . UVULECTOMY  2009    Current Medications: Current Meds  Medication Sig  . Accu-Chek Softclix Lancets lancets SMARTSIG:Topical  . albuterol (PROVENTIL HFA;VENTOLIN HFA) 108 (90 BASE) MCG/ACT inhaler Inhale 1-2 puffs into the lungs every 6 (six) hours as needed for wheezing or shortness of breath.  Marland Kitchen amLODipine (NORVASC) 5 MG tablet Take 5 mg by mouth daily.   Marland Kitchen aspirin EC 81 MG tablet Take 1 tablet (81 mg total) by mouth daily. Swallow whole.  Marland Kitchen atorvastatin (LIPITOR) 80 MG tablet Take 1 tablet (80 mg total) by mouth daily.  . bictegravir-emtricitabine-tenofovir AF (BIKTARVY) 50-200-25 MG TABS tablet  Take 1 tablet by mouth daily.  . diphenhydrAMINE (BENADRYL) 25 MG tablet Take 2 tablets (50 mg total) by mouth every 8 (eight) hours as needed (allergic reaction).  . fluticasone (FLONASE) 50 MCG/ACT nasal spray Place 2 sprays into both nostrils daily as needed for allergies.   Marland Kitchen loratadine (CLARITIN) 10 MG tablet Take 10 mg by mouth daily as needed for allergies.   Marland Kitchen losartan (COZAAR) 50 MG tablet Take 50 mg by mouth daily.  . meloxicam (MOBIC) 15 MG tablet Take 15 mg by mouth daily.  . metFORMIN (GLUCOPHAGE) 500 MG tablet Take 500 mg by mouth daily with breakfast.  . metoprolol tartrate (LOPRESSOR) 100 MG tablet Take 1 tablet by mouth once for procedure.  . neomycin-polymyxin-hydrocortisone (CORTISPORIN) OTIC solution Apply 1 to 2 drops to toe BID after soaking  . nitroGLYCERIN (NITROSTAT) 0.4 MG SL tablet Place 1 tablet (0.4 mg total) under the tongue every 5 (five) minutes as needed.  . terazosin (HYTRIN) 2 MG capsule Take 2 mg by mouth at bedtime.   Marland Kitchen testosterone cypionate (DEPOTESTOSTERONE CYPIONATE) 200 MG/ML injection Inject 200 mg into the muscle every 14 (fourteen) days.   . valACYclovir (VALTREX) 500 MG tablet TAKE 2 TABLETS (1,000 MG TOTAL) BY MOUTH DAILY. (Patient taking differently: Take 500 mg by mouth 2 (two) times daily. )  . [DISCONTINUED] pantoprazole (PROTONIX) 40 MG tablet Take 40 mg by mouth daily before breakfast.   Current Facility-Administered Medications for the 04/07/20 encounter (Office Visit) with Geralynn Rile, MD  Medication  . sodium chloride flush (NS) 0.9 % injection 3 mL     Allergies:    Black walnut pollen allergy skin test, Shellfish allergy, Other, and Penicillins   Social History: Social History   Socioeconomic History  . Marital status: Legally Separated    Spouse name: Not on file  . Number of children: 6  . Years of education: Not on file  . Highest education level: Not on file  Occupational History  . Occupation: [employer]   Tobacco  Use  . Smoking status: Former Smoker    Years: 30.00    Quit date: 12/04/2003    Years since quitting: 16.3  . Smokeless tobacco: Never Used  . Tobacco comment: pt. no longer smokes  Vaping Use  . Vaping Use: Never used  Substance and Sexual Activity  . Alcohol use: Yes    Alcohol/week: 0.0 standard drinks    Comment: occasional  . Drug use: Not Currently    Comment: occasional  . Sexual activity: Yes    Partners: Female    Comment: pt. declined condoms  Other Topics Concern  . Not on file  Social History Narrative  . Not on file   Social Determinants of Health   Financial Resource Strain:   . Difficulty of Paying Living  Expenses: Not on file  Food Insecurity:   . Worried About Charity fundraiser in the Last Year: Not on file  . Ran Out of Food in the Last Year: Not on file  Transportation Needs:   . Lack of Transportation (Medical): Not on file  . Lack of Transportation (Non-Medical): Not on file  Physical Activity:   . Days of Exercise per Week: Not on file  . Minutes of Exercise per Session: Not on file  Stress:   . Feeling of Stress : Not on file  Social Connections:   . Frequency of Communication with Friends and Family: Not on file  . Frequency of Social Gatherings with Friends and Family: Not on file  . Attends Religious Services: Not on file  . Active Member of Clubs or Organizations: Not on file  . Attends Archivist Meetings: Not on file  . Marital Status: Not on file     Family History: The patient's family history includes Arthritis in his mother and sister; Heart disease in his father; Hypertension in his brother, father, mother, and sister.  ROS:   All other ROS reviewed and negative. Pertinent positives noted in the HPI.     EKGs/Labs/Other Studies Reviewed:   The following studies were personally reviewed by me today:  [place] 03/15/2020 SUMMARY  Angiographically minimal disease, apical LAD tapers to roughly 50%, D1 has roughly 45%  eccentric lesion.  Normal LVEDP    Recent Labs: 09/08/2019: ALT 21 03/09/2020: BUN 10; Creatinine, Ser 1.18; Hemoglobin 15.9; Platelets 262; Potassium 5.1; Sodium 140   Recent Lipid Panel    Component Value Date/Time   CHOL 133 09/08/2019 0905   TRIG 69 09/08/2019 0905   HDL 48 09/08/2019 0905   CHOLHDL 2.8 09/08/2019 0905   VLDL 13 12/28/2015 1135   LDLCALC 70 09/08/2019 0905    Physical Exam:   VS:  BP (!) 150/86   Pulse 92   Ht 5\' 11"  (1.803 m)   Wt (!) 403 lb 9.6 oz (183.1 kg)   SpO2 93%   BMI 56.29 kg/m    Wt Readings from Last 3 Encounters:  04/07/20 (!) 403 lb 9.6 oz (183.1 kg)  04/05/20 (!) 404 lb (183.3 kg)  03/15/20 (!) 399 lb (181 kg)    General: Well nourished, well developed, in no acute distress Heart: Atraumatic, normal size  Eyes: PEERLA, EOMI  Neck: Supple, no JVD Endocrine: No thryomegaly Cardiac: Normal S1, S2; RRR; no murmurs, rubs, or gallops Lungs: Clear to auscultation bilaterally, no wheezing, rhonchi or rales  Abd: Soft, nontender, no hepatomegaly  Ext: No edema, pulses 2+ Musculoskeletal: No deformities, BUE and BLE strength normal and equal Skin: Warm and dry, no rashes   Neuro: Alert and oriented to person, place, time, and situation, CNII-XII grossly intact, no focal deficits  Psych: Normal mood and affect   ASSESSMENT:   Vincent Young is a 54 y.o. male who presents for the following: 1. Coronary artery disease involving native coronary artery of native heart without angina pectoris   2. Mixed hyperlipidemia   3. Gastroesophageal reflux disease without esophagitis   4. Obesity, morbid, BMI 50 or higher ([place])   5. Sleep apnea, unspecified type     PLAN:   1. Coronary artery disease involving native coronary artery of native heart without angina pectoris -Elevated coronary calcium score.  There was concerns for severe stenosis in the first diagonal branch on cardiac CT.  He underwent cardiac catheterization which showed 40 to  50% lesion in the diagonal branch.  Also had minimal disease in the LAD.  The CTA was a false positive. -I have recommended aspirin 81 mg daily moving forward.  Of also recommended continue Lipitor 80 mg daily.  His most recent LDL cholesterol is at goal at 60.  He needs to work on diet and exercise moving forward. -His symptoms of chest pain are likely acid reflux related.  I recommended Prilosec.  2. Mixed hyperlipidemia -Continue Lipitor.  Most recent LDL at goal.  3. Gastroesophageal reflux disease without esophagitis -Prilosec 20 mg daily  4. Obesity, morbid, BMI 50 or higher ([place]) 5. Sleep apnea, unspecified type -He has severe sleep apnea.  Needs in lab titration.  We will reach out to the sleep team.  Disposition: Return in about 1 year (around 04/07/2021).  Medication Adjustments/Labs and Tests Ordered: Current medicines are reviewed at length with the patient today.  Concerns regarding medicines are outlined above.  No orders of the defined types were placed in this encounter.  Meds ordered this encounter  Medications  . omeprazole (PRILOSEC) 20 MG capsule    Sig: Take 1 capsule (20 mg total) by mouth daily.    Dispense:  30 capsule    Refill:  11    Patient Instructions  Medication Instructions:  Your physician has recommended you make the following change in your medication:  1. Start Prilosec one tablet by mouth ( 20 mg ) daily sent in today # 30 to requested pharmacy.    *If you need a refill on your cardiac medications before your next appointment, please call your pharmacy*   Lab Work: None If you have labs (blood work) drawn today and your tests are completely normal, you will receive your results only by: Marland Kitchen MyChart Message (if you have MyChart) OR . A paper copy in the mail If you have any lab test that is abnormal or we need to change your treatment, we will call you to review the results.   Testing/Procedures: None   Follow-Up: At [facility],  you and your health needs are our priority.  As part of our continuing mission to provide you with exceptional heart care, we have created designated Provider Care Teams.  These Care Teams include your primary Cardiologist (physician) and Advanced Practice Providers (APPs -  Physician Assistants and Nurse Practitioners) who all work together to provide you with the care you need, when you need it.  We recommend signing up for the patient portal called "MyChart".  Sign up information is provided on this After Visit Summary.  MyChart is used to connect with patients for Virtual Visits (Telemedicine).  Patients are able to view lab/test results, encounter notes, upcoming appointments, etc.  Non-urgent messages can be sent to your provider as well.   To learn more about what you can do with MyChart, go to [email or web address].    Your next appointment:   1 year(s)  The format for your next appointment:   In Person  Provider:   Eleonore Chiquito, MD   Other Instructions None     Time Spent with Patient: I have spent a total of 35 minutes with patient reviewing hospital notes, telemetry, EKGs, labs and examining the patient as well as establishing an assessment and plan that was discussed with the patient.  > 50% of time was spent in direct patient care.  Signed, Addison Naegeli. Audie Box, [place]  [address] [place], [place] [address] [phone]  04/07/2020 3:33 PM

## 2020-04-07 NOTE — Progress Notes (Signed)
   Covid-19 Vaccination Clinic  Name:  Vincent Young    MRN: [id number] DOB: 30-Oct-1965  04/07/2020  [name] was observed post Covid-19 immunization for 15 minutes without incident. He was provided with Vaccine Information Sheet and instruction to access the V-Safe system.   [name] was instructed to call 911 with any severe reactions post vaccine: Marland Kitchen Difficulty breathing  . Swelling of face and throat  . A fast heartbeat  . A bad rash all over body  . Dizziness and weakness   Immunizations Administered    Name Date Dose VIS Date Route   Pfizer COVID-19 Vaccine 04/07/2020 10:46 AM 0.3 mL 03/08/2020 Intramuscular   Manufacturer: [employer]   Lot: [id number]   [place]: [id number]

## 2020-04-11 ENCOUNTER — Telehealth: Payer: Self-pay | Admitting: Cardiovascular Disease

## 2020-04-11 ENCOUNTER — Other Ambulatory Visit: Payer: Self-pay

## 2020-04-11 DIAGNOSIS — B20 Human immunodeficiency virus [HIV] disease: Secondary | ICD-10-CM

## 2020-04-11 MED ORDER — BIKTARVY 50-200-25 MG PO TABS: 1.0000 | ORAL_TABLET | Freq: Every day | ORAL | 5 refills | Status: DC

## 2020-04-11 MED FILL — VALACYCLOVIR HCL 500 MG TAB: 500 | 30 days supply | Qty: 60 | Fill #1

## 2020-04-11 NOTE — Addendum Note (Signed)
Addended by: Freada Bergeron on: 04/11/2020 10:44 AM   Modules accepted: Orders

## 2020-04-11 NOTE — Telephone Encounter (Signed)
No PA required for Cpap titration, but this is out of network for the patient and insurance would not cover.  Please advise.

## 2020-04-17 MED FILL — BIKTARVY 50-200-25 MG TABS: 50-200-25 | 30 days supply | Qty: 30 | Fill #0

## 2020-04-19 ENCOUNTER — Ambulatory Visit: Payer: Medicare HMO | Admitting: Podiatry

## 2020-04-25 DIAGNOSIS — E291 Testicular hypofunction: Secondary | ICD-10-CM | POA: Diagnosis not present

## 2020-04-28 ENCOUNTER — Telehealth: Payer: Self-pay | Admitting: *Deleted

## 2020-04-28 NOTE — Telephone Encounter (Signed)
Out of network policy # ID [id number]

## 2020-04-28 NOTE — Telephone Encounter (Signed)
-----   Message from [facility] sent at 04/28/2020  1:48 PM EST ----- Regarding: RE: precert Did you let patient know he is out of network? I want to make sure before I call and let him know lol ----- Message ----- From: Freada Bergeron, CMA Sent: 04/26/2020   6:26 PM EST To: Imagene Gurney Subject: RE: [place] ----- Message ----- From: Tye Savoy Sent: 04/11/2020   1:55 PM EST To: Freada Bergeron, CMA Subject: RE: precert                                    No PA required for Cpap titration, but this is out of network for the patient and insurance would not cover.  Please advise. ----- Message ----- From: Freada Bergeron, CMA Sent: 03/30/2020  11:39 AM EST To: [facility] Sleep Studies Subject: precert                                        Cpap titration

## 2020-05-02 NOTE — Telephone Encounter (Signed)
05/02/20 4:31pm Auth submitted, case # [id number]

## 2020-05-08 NOTE — Telephone Encounter (Signed)
Uploaded sleep study to [facility] for additional clinical information.

## 2020-05-09 DIAGNOSIS — E291 Testicular hypofunction: Secondary | ICD-10-CM | POA: Diagnosis not present

## 2020-05-10 ENCOUNTER — Ambulatory Visit: Payer: Medicare HMO | Admitting: Cardiovascular Disease

## 2020-05-10 NOTE — Telephone Encounter (Signed)
Faxed appeal to [employer] with sleep study attached

## 2020-05-15 DIAGNOSIS — M170 Bilateral primary osteoarthritis of knee: Secondary | ICD-10-CM | POA: Diagnosis not present

## 2020-05-15 DIAGNOSIS — E7800 Pure hypercholesterolemia, unspecified: Secondary | ICD-10-CM | POA: Diagnosis not present

## 2020-05-15 DIAGNOSIS — G8929 Other chronic pain: Secondary | ICD-10-CM | POA: Diagnosis not present

## 2020-05-15 DIAGNOSIS — E782 Mixed hyperlipidemia: Secondary | ICD-10-CM | POA: Diagnosis not present

## 2020-05-15 DIAGNOSIS — K219 Gastro-esophageal reflux disease without esophagitis: Secondary | ICD-10-CM | POA: Diagnosis not present

## 2020-05-15 DIAGNOSIS — I10 Essential (primary) hypertension: Secondary | ICD-10-CM | POA: Diagnosis not present

## 2020-05-15 DIAGNOSIS — E1169 Type 2 diabetes mellitus with other specified complication: Secondary | ICD-10-CM | POA: Diagnosis not present

## 2020-05-15 DIAGNOSIS — I2510 Atherosclerotic heart disease of native coronary artery without angina pectoris: Secondary | ICD-10-CM | POA: Diagnosis not present

## 2020-05-15 DIAGNOSIS — J4541 Moderate persistent asthma with (acute) exacerbation: Secondary | ICD-10-CM | POA: Diagnosis not present

## 2020-05-19 DIAGNOSIS — E1169 Type 2 diabetes mellitus with other specified complication: Secondary | ICD-10-CM | POA: Diagnosis not present

## 2020-05-24 DIAGNOSIS — E291 Testicular hypofunction: Secondary | ICD-10-CM | POA: Diagnosis not present

## 2020-06-01 NOTE — Telephone Encounter (Signed)
Approved, auth # [id number]

## 2020-06-08 DIAGNOSIS — M1990 Unspecified osteoarthritis, unspecified site: Secondary | ICD-10-CM | POA: Diagnosis not present

## 2020-06-08 DIAGNOSIS — E785 Hyperlipidemia, unspecified: Secondary | ICD-10-CM | POA: Diagnosis not present

## 2020-06-08 DIAGNOSIS — I10 Essential (primary) hypertension: Secondary | ICD-10-CM | POA: Diagnosis not present

## 2020-06-08 DIAGNOSIS — J449 Chronic obstructive pulmonary disease, unspecified: Secondary | ICD-10-CM | POA: Diagnosis not present

## 2020-06-08 DIAGNOSIS — G8929 Other chronic pain: Secondary | ICD-10-CM | POA: Diagnosis not present

## 2020-06-08 DIAGNOSIS — B009 Herpesviral infection, unspecified: Secondary | ICD-10-CM | POA: Diagnosis not present

## 2020-06-08 DIAGNOSIS — R69 Illness, unspecified: Secondary | ICD-10-CM | POA: Diagnosis not present

## 2020-06-08 DIAGNOSIS — J309 Allergic rhinitis, unspecified: Secondary | ICD-10-CM | POA: Diagnosis not present

## 2020-06-08 DIAGNOSIS — E119 Type 2 diabetes mellitus without complications: Secondary | ICD-10-CM | POA: Diagnosis not present

## 2020-06-08 MED FILL — BIKTARVY 50-200-25 MG TABS: 50-200-25 | 30 days supply | Qty: 30 | Fill #1

## 2020-06-08 NOTE — Telephone Encounter (Signed)
start date 05/29/20 - End date 11/26/20.

## 2020-06-09 DIAGNOSIS — E782 Mixed hyperlipidemia: Secondary | ICD-10-CM | POA: Diagnosis not present

## 2020-06-09 DIAGNOSIS — I10 Essential (primary) hypertension: Secondary | ICD-10-CM | POA: Diagnosis not present

## 2020-06-09 DIAGNOSIS — G8929 Other chronic pain: Secondary | ICD-10-CM | POA: Diagnosis not present

## 2020-06-09 DIAGNOSIS — I2510 Atherosclerotic heart disease of native coronary artery without angina pectoris: Secondary | ICD-10-CM | POA: Diagnosis not present

## 2020-06-09 DIAGNOSIS — E1169 Type 2 diabetes mellitus with other specified complication: Secondary | ICD-10-CM | POA: Diagnosis not present

## 2020-06-09 DIAGNOSIS — E7800 Pure hypercholesterolemia, unspecified: Secondary | ICD-10-CM | POA: Diagnosis not present

## 2020-06-09 DIAGNOSIS — J4541 Moderate persistent asthma with (acute) exacerbation: Secondary | ICD-10-CM | POA: Diagnosis not present

## 2020-06-09 DIAGNOSIS — M170 Bilateral primary osteoarthritis of knee: Secondary | ICD-10-CM | POA: Diagnosis not present

## 2020-06-09 DIAGNOSIS — K219 Gastro-esophageal reflux disease without esophagitis: Secondary | ICD-10-CM | POA: Diagnosis not present

## 2020-06-09 DIAGNOSIS — E119 Type 2 diabetes mellitus without complications: Secondary | ICD-10-CM | POA: Diagnosis not present

## 2020-06-27 DIAGNOSIS — T161XXA Foreign body in right ear, initial encounter: Secondary | ICD-10-CM | POA: Diagnosis not present

## 2020-06-28 MED FILL — VALACYCLOVIR HCL 500 MG TAB: 500 | 30 days supply | Qty: 60 | Fill #2

## 2020-06-30 DIAGNOSIS — M1711 Unilateral primary osteoarthritis, right knee: Secondary | ICD-10-CM | POA: Diagnosis not present

## 2020-06-30 DIAGNOSIS — M1712 Unilateral primary osteoarthritis, left knee: Secondary | ICD-10-CM | POA: Diagnosis not present

## 2020-07-03 DIAGNOSIS — R69 Illness, unspecified: Secondary | ICD-10-CM | POA: Diagnosis not present

## 2020-07-03 DIAGNOSIS — E118 Type 2 diabetes mellitus with unspecified complications: Secondary | ICD-10-CM | POA: Diagnosis not present

## 2020-07-03 DIAGNOSIS — I10 Essential (primary) hypertension: Secondary | ICD-10-CM | POA: Diagnosis not present

## 2020-07-07 DIAGNOSIS — M1712 Unilateral primary osteoarthritis, left knee: Secondary | ICD-10-CM | POA: Diagnosis not present

## 2020-07-07 DIAGNOSIS — M1711 Unilateral primary osteoarthritis, right knee: Secondary | ICD-10-CM | POA: Diagnosis not present

## 2020-07-12 ENCOUNTER — Ambulatory Visit (HOSPITAL_BASED_OUTPATIENT_CLINIC_OR_DEPARTMENT_OTHER): Payer: Medicare HMO | Attending: Cardiovascular Disease | Admitting: Cardiovascular Disease

## 2020-07-12 ENCOUNTER — Other Ambulatory Visit: Payer: Self-pay

## 2020-07-12 ENCOUNTER — Encounter (HOSPITAL_BASED_OUTPATIENT_CLINIC_OR_DEPARTMENT_OTHER): Payer: Self-pay | Admitting: Cardiovascular Disease

## 2020-07-12 DIAGNOSIS — G4730 Sleep apnea, unspecified: Secondary | ICD-10-CM | POA: Diagnosis not present

## 2020-07-12 DIAGNOSIS — Z7984 Long term (current) use of oral hypoglycemic drugs: Secondary | ICD-10-CM | POA: Insufficient documentation

## 2020-07-12 DIAGNOSIS — G4733 Obstructive sleep apnea (adult) (pediatric): Secondary | ICD-10-CM | POA: Diagnosis not present

## 2020-07-12 DIAGNOSIS — Z791 Long term (current) use of non-steroidal anti-inflammatories (NSAID): Secondary | ICD-10-CM | POA: Diagnosis not present

## 2020-07-12 DIAGNOSIS — Z7982 Long term (current) use of aspirin: Secondary | ICD-10-CM | POA: Diagnosis not present

## 2020-07-12 DIAGNOSIS — Z79899 Other long term (current) drug therapy: Secondary | ICD-10-CM | POA: Diagnosis not present

## 2020-07-12 HISTORY — DX: Obstructive sleep apnea (adult) (pediatric): G47.33

## 2020-07-17 ENCOUNTER — Telehealth: Payer: Self-pay

## 2020-07-17 MED FILL — BIKTARVY 50-200-25 MG TABS: 50-200-25 | 30 days supply | Qty: 30 | Fill #2

## 2020-07-17 NOTE — Telephone Encounter (Addendum)
[facility] Patient Advocate Encounter   I was successful in securing patient a $7500 grant from [employer] to provide copayment coverage for Biktarvy.  The patient's out of pocket cost will be $0.00 monthly.     I have spoken with the patient.    The billing information is as follows and has been shared with [facility].      Patient ID # was changed to [id number]   Dates of Eligibility: 07/17/20 through 05/19/21  Patient knows to call the office with questions or concerns.  Ileene Patrick, [facility] Patient [facility] Phone: [phone] Fax:  [phone]

## 2020-07-25 DIAGNOSIS — I10 Essential (primary) hypertension: Secondary | ICD-10-CM | POA: Diagnosis not present

## 2020-07-25 DIAGNOSIS — K219 Gastro-esophageal reflux disease without esophagitis: Secondary | ICD-10-CM | POA: Diagnosis not present

## 2020-07-25 DIAGNOSIS — G8929 Other chronic pain: Secondary | ICD-10-CM | POA: Diagnosis not present

## 2020-07-25 DIAGNOSIS — I2510 Atherosclerotic heart disease of native coronary artery without angina pectoris: Secondary | ICD-10-CM | POA: Diagnosis not present

## 2020-07-25 DIAGNOSIS — E1169 Type 2 diabetes mellitus with other specified complication: Secondary | ICD-10-CM | POA: Diagnosis not present

## 2020-07-25 DIAGNOSIS — E782 Mixed hyperlipidemia: Secondary | ICD-10-CM | POA: Diagnosis not present

## 2020-07-25 DIAGNOSIS — J4541 Moderate persistent asthma with (acute) exacerbation: Secondary | ICD-10-CM | POA: Diagnosis not present

## 2020-07-25 DIAGNOSIS — E119 Type 2 diabetes mellitus without complications: Secondary | ICD-10-CM | POA: Diagnosis not present

## 2020-07-27 ENCOUNTER — Encounter (HOSPITAL_BASED_OUTPATIENT_CLINIC_OR_DEPARTMENT_OTHER): Payer: Self-pay | Admitting: Cardiovascular Disease

## 2020-07-27 NOTE — Procedures (Signed)
Patient Name: Vincent Young, Vincent Young Date: 07/12/2020 Gender: Male D.O.B: [birth year] Age (years): 54 Referring Provider: Cassie Freer ONeal Height (inches): 71 Interpreting Physician: Shelva Majestic MD, ABSM Weight (lbs): 400 RPSGT: Carolin Coy BMI: 85 MRN: [id number] Neck Size: 18.00  CLINICAL INFORMATION The patient is referred for a BiPAP titration to treat sleep apnea.  Date of HST: 03/20/2020:  AHI 77.7/h; O2 nadir 54%; time spent <89% was 263.3 minutes  SLEEP STUDY TECHNIQUE As per the AASM Manual for the Scoring of Sleep and Associated Events v2.3 (April 2016) with a hypopnea requiring 4% desaturations.  The channels recorded and monitored were frontal, central and occipital EEG, electrooculogram (EOG), submentalis EMG (chin), nasal and oral airflow, thoracic and abdominal wall motion, anterior tibialis EMG, snore microphone, electrocardiogram, and pulse oximetry. Bilevel positive airway pressure (BPAP) was initiated at the beginning of the study and titrated to treat sleep-disordered breathing.  MEDICATIONS albuterol (PROVENTIL HFA;VENTOLIN HFA) 108 (90 BASE) MCG/ACT inhaler amLODipine (NORVASC) 5 MG tablet aspirin EC 81 MG tablet atorvastatin (LIPITOR) 80 MG tablet bictegravir-emtricitabine-tenofovir AF (BIKTARVY) 50-200-25 MG TABS tablet diphenhydrAMINE (BENADRYL) 25 MG tablet fluticasone (FLONASE) 50 MCG/ACT nasal spray loratadine (CLARITIN) 10 MG tablet losartan (COZAAR) 50 MG tablet meloxicam (MOBIC) 15 MG tablet metFORMIN (GLUCOPHAGE) 500 MG tablet metoprolol tartrate (LOPRESSOR) 100 MG tablet neomycin-polymyxin-hydrocortisone (CORTISPORIN) OTIC solution nitroGLYCERIN (NITROSTAT) 0.4 MG SL tablet(Expired) omeprazole (PRILOSEC) 20 MG capsule terazosin (HYTRIN) 2 MG capsule testosterone cypionate (DEPOTESTOSTERONE CYPIONATE) 200 MG/ML injection valACYclovir (VALTREX) 500 MG tablet Medications self-administered by patient taken the night of the study  : N/A  RESPIRATORY PARAMETERS Optimal IPAP Pressure (cm): 24 AHI at Optimal Pressure (/hr) 0.9 Optimal EPAP Pressure (cm): 20   Overall Minimal O2 (%): 81.0 Minimal O2 at Optimal Pressure (%): 91.0  SLEEP ARCHITECTURE Start Time: 10:54:23 PM Stop Time: 5:43:39 AM Total Time (min): 409.3 Total Sleep Time (min): 363.4 Sleep Latency (min): 11.9 Sleep Efficiency (%): 88.8% REM Latency (min): 78.0 WASO (min): 34.0 Stage N1 (%): 7.7% Stage N2 (%): 73.1% Stage N3 (%): 0.0% Stage R (%): 19.3 Supine (%): 73.75 Arousal Index (/hr): 12.9   CARDIAC DATA The 2 lead EKG demonstrated sinus rhythm. The mean heart rate was 80.5 beats per minute. Other EKG findings include: rare PVCs.  LEG MOVEMENT DATA The total Periodic Limb Movements of Sleep (PLMS) were 0. The PLMS index was 0.0. A PLMS index of <15 is considered normal in adults.  IMPRESSIONS - CPAP was started at 7 cm of water, was titrated to 20 cm and transited to BiPAP at 24/20 (AHI 0.9/h; O2 nadir 91%) - Central sleep apnea was not noted during this titration (CAI = 0.5/h). - Moderete oxygen desaturations to a nadir of 81.0% at 11 cm. - The patient snored with moderate snoring volume. - 2-lead EKG demonstrated: PVCs - Clinically significant periodic limb movements were not noted during this study. Arousals associated with PLMs were rare.  DIAGNOSIS - Obstructive Sleep Apnea (G47.33)  RECOMMENDATIONS - Recommend an initial trial of BiPAP Auto therapy at EPAP min18, PS of 4 and IPAP max at 25 cm H2O with heated humidification. A Large size Resmed Nasal Pillow Mask AirFit P10 mask was used for the titration. - Effort should be made to optimize nasal and oropharyngeal patency. - Avoid alcohol, sedatives and other CNS depressants that may worsen sleep apnea and disrupt normal sleep architecture. - Sleep hygiene should be reviewed to assess factors that may improve sleep quality. - Weight management (BMI 56) and regular exercise should  be  initiated or continued. - Recommend a download in 30 days and sleep clinic evaluation after 4 weeks of therapy.  [Electronically signed] 07/27/2020 07:20 PM  Shelva Majestic MD, [facility], [place], American Board of Sleep Medicine   NPI: [id number] [place] PH: [phone]   FX: [phone] [place]

## 2020-07-31 ENCOUNTER — Telehealth: Payer: Self-pay | Admitting: *Deleted

## 2020-07-31 NOTE — Telephone Encounter (Signed)
Patient notified that his titration study has been completed and orders for BIPAP has been sent to [facility].

## 2020-08-08 ENCOUNTER — Telehealth: Payer: Self-pay

## 2020-08-08 NOTE — Telephone Encounter (Signed)
I have no sense of the patient. We can rx him a week supply of ensure 3 cans a day and let dr Linus Salmons review further Thanks

## 2020-08-08 NOTE — Telephone Encounter (Signed)
Patient called, requesting Ensure prescription be faxed to [facility].   Beryle Flock, RN

## 2020-08-08 NOTE — Telephone Encounter (Signed)
RN spoke with Dr. Gale Journey, he has agreed to hold off on Ensure prescription. Will have Dr. Linus Salmons review if appropriate for patient.   Beryle Flock, RN

## 2020-08-09 NOTE — Telephone Encounter (Signed)
Patient returned call. He states PCP suggested ensure to help control his diabetes. RN relayed Dr Henreitta Leber message, explained that there are diabetic supplements, but it is not the Ensure that we prescribe for malnourishment. Patietn will follow up with PCP, diabetic nutrition. Landis Gandy, RN

## 2020-08-09 NOTE — Telephone Encounter (Signed)
He does not qualify for ensure, he is not underweight.  thanks

## 2020-08-09 NOTE — Telephone Encounter (Signed)
Attempted to call patient to relay that he does not qualify for an Ensure prescription, no answer. Left HIPAA compliant voicemail requesting callback.   Beryle Flock, RN

## 2020-08-15 ENCOUNTER — Other Ambulatory Visit (HOSPITAL_COMMUNITY): Payer: Self-pay

## 2020-08-17 DIAGNOSIS — Z0000 Encounter for general adult medical examination without abnormal findings: Secondary | ICD-10-CM | POA: Diagnosis not present

## 2020-08-17 DIAGNOSIS — Z1389 Encounter for screening for other disorder: Secondary | ICD-10-CM | POA: Diagnosis not present

## 2020-08-21 ENCOUNTER — Other Ambulatory Visit: Payer: Self-pay

## 2020-08-21 ENCOUNTER — Ambulatory Visit: Payer: Medicare HMO | Admitting: Podiatry

## 2020-08-21 ENCOUNTER — Other Ambulatory Visit (HOSPITAL_COMMUNITY): Payer: Self-pay

## 2020-08-21 DIAGNOSIS — L600 Ingrowing nail: Secondary | ICD-10-CM

## 2020-08-21 MED ORDER — KETOCONAZOLE 2 % EX CREA: 1.0000 "application " | TOPICAL_CREAM | Freq: Every day | CUTANEOUS | 2 refills | Status: DC

## 2020-08-21 MED FILL — Bictegravir-Emtricitabine-Tenofovir AF Tab 50-200-25 MG: ORAL | 30 days supply | Qty: 30 | Fill #0 | Status: AC

## 2020-08-22 ENCOUNTER — Other Ambulatory Visit (HOSPITAL_COMMUNITY): Payer: Self-pay

## 2020-08-23 ENCOUNTER — Other Ambulatory Visit (HOSPITAL_COMMUNITY): Payer: Self-pay

## 2020-08-23 NOTE — Progress Notes (Signed)
  Subjective:  Patient ID: Vincent Young, male    DOB: 1965-09-27,  MRN: [id number]  Chief Complaint  Patient presents with  . Nail Problem     diabetic with painful nailbed    55 y.o. male presents with the above complaint. History confirmed with patient.   Objective:  Physical Exam: warm, good capillary refill, no trophic changes or ulcerative lesions, normal DP and PT pulses and normal sensory exam. Both hallux nails with partially incurvated borders medially, no deep ingrown or paronychia  Assessment:  No diagnosis found.   Plan:  Patient was evaluated and treated and all questions answered.  Patient educated on diabetes. Discussed proper diabetic foot care and discussed risks and complications of disease. Educated patient in depth on reasons to return to the office immediately should he/she discover anything concerning or new on the feet. All questions answered. Discussed proper shoes as well.   Incurvated borders debrided, did not require partial plate avulsion  Return if symptoms worsen or fail to improve.

## 2020-08-24 ENCOUNTER — Other Ambulatory Visit (HOSPITAL_COMMUNITY): Payer: Self-pay

## 2020-08-26 ENCOUNTER — Emergency Department (HOSPITAL_BASED_OUTPATIENT_CLINIC_OR_DEPARTMENT_OTHER): Payer: Medicare HMO

## 2020-08-26 ENCOUNTER — Emergency Department (HOSPITAL_BASED_OUTPATIENT_CLINIC_OR_DEPARTMENT_OTHER)
Admission: EM | Admit: 2020-08-26 | Discharge: 2020-08-26 | Disposition: A | Discharge status: Home / Self Care | Payer: Medicare HMO | Attending: Emergency Medicine | Admitting: Emergency Medicine

## 2020-08-26 ENCOUNTER — Encounter (HOSPITAL_BASED_OUTPATIENT_CLINIC_OR_DEPARTMENT_OTHER): Payer: Self-pay

## 2020-08-26 DIAGNOSIS — Z7982 Long term (current) use of aspirin: Secondary | ICD-10-CM | POA: Diagnosis not present

## 2020-08-26 DIAGNOSIS — R1013 Epigastric pain: Secondary | ICD-10-CM | POA: Insufficient documentation

## 2020-08-26 DIAGNOSIS — Z7984 Long term (current) use of oral hypoglycemic drugs: Secondary | ICD-10-CM | POA: Diagnosis not present

## 2020-08-26 DIAGNOSIS — Z79899 Other long term (current) drug therapy: Secondary | ICD-10-CM | POA: Diagnosis not present

## 2020-08-26 DIAGNOSIS — I2510 Atherosclerotic heart disease of native coronary artery without angina pectoris: Secondary | ICD-10-CM | POA: Insufficient documentation

## 2020-08-26 DIAGNOSIS — R112 Nausea with vomiting, unspecified: Secondary | ICD-10-CM | POA: Diagnosis not present

## 2020-08-26 DIAGNOSIS — E860 Dehydration: Secondary | ICD-10-CM | POA: Insufficient documentation

## 2020-08-26 DIAGNOSIS — R69 Illness, unspecified: Secondary | ICD-10-CM | POA: Diagnosis not present

## 2020-08-26 DIAGNOSIS — J45909 Unspecified asthma, uncomplicated: Secondary | ICD-10-CM | POA: Insufficient documentation

## 2020-08-26 DIAGNOSIS — Z87891 Personal history of nicotine dependence: Secondary | ICD-10-CM | POA: Insufficient documentation

## 2020-08-26 DIAGNOSIS — E119 Type 2 diabetes mellitus without complications: Secondary | ICD-10-CM | POA: Insufficient documentation

## 2020-08-26 DIAGNOSIS — Z21 Asymptomatic human immunodeficiency virus [HIV] infection status: Secondary | ICD-10-CM | POA: Diagnosis not present

## 2020-08-26 DIAGNOSIS — R000 Tachycardia, unspecified: Secondary | ICD-10-CM | POA: Diagnosis not present

## 2020-08-26 DIAGNOSIS — R1111 Vomiting without nausea: Secondary | ICD-10-CM | POA: Diagnosis not present

## 2020-08-26 DIAGNOSIS — I10 Essential (primary) hypertension: Secondary | ICD-10-CM | POA: Insufficient documentation

## 2020-08-26 DIAGNOSIS — Z743 Need for continuous supervision: Secondary | ICD-10-CM | POA: Diagnosis not present

## 2020-08-26 DIAGNOSIS — R197 Diarrhea, unspecified: Secondary | ICD-10-CM

## 2020-08-26 DIAGNOSIS — R1110 Vomiting, unspecified: Secondary | ICD-10-CM | POA: Diagnosis not present

## 2020-08-26 DIAGNOSIS — R109 Unspecified abdominal pain: Secondary | ICD-10-CM | POA: Diagnosis not present

## 2020-08-26 LAB — COMPREHENSIVE METABOLIC PANEL
ALT: 47 U/L — ABNORMAL HIGH (ref 0–44)
AST: 30 U/L (ref 15–41)
Albumin: 4.4 g/dL (ref 3.5–5.0)
Alkaline Phosphatase: 101 U/L (ref 38–126)
Anion gap: 10 (ref 5–15)
BUN: 20 mg/dL (ref 6–20)
CO2: 25 mmol/L (ref 22–32)
Calcium: 9.5 mg/dL (ref 8.9–10.3)
Chloride: 104 mmol/L (ref 98–111)
Creatinine, Ser: 1.46 mg/dL — ABNORMAL HIGH (ref 0.61–1.24)
GFR, Estimated: 56 mL/min — ABNORMAL LOW (ref >60)
Glucose, Bld: 129 mg/dL — ABNORMAL HIGH (ref 70–99)
Potassium: 3.6 mmol/L (ref 3.5–5.1)
Sodium: 139 mmol/L (ref 135–145)
Total Bilirubin: 0.6 mg/dL (ref 0.3–1.2)
Total Protein: 8.9 g/dL — ABNORMAL HIGH (ref 6.5–8.1)

## 2020-08-26 LAB — CBC WITH DIFFERENTIAL/PLATELET
Abs Immature Granulocytes: 0.02 10*3/uL (ref 0.00–0.07)
Basophils Absolute: 0 10*3/uL (ref 0.0–0.1)
Basophils Relative: 0 %
Eosinophils Absolute: 0 10*3/uL (ref 0.0–0.5)
Eosinophils Relative: 0 %
HCT: 45.4 % (ref 39.0–52.0)
Hemoglobin: 15.1 g/dL (ref 13.0–17.0)
Immature Granulocytes: 0 %
Lymphocytes Relative: 19 %
Lymphs Abs: 1.5 10*3/uL (ref 0.7–4.0)
MCH: 31.3 pg (ref 26.0–34.0)
MCHC: 33.3 g/dL (ref 30.0–36.0)
MCV: 94.2 fL (ref 80.0–100.0)
Monocytes Absolute: 0.8 10*3/uL (ref 0.1–1.0)
Monocytes Relative: 10 %
Neutro Abs: 5.7 10*3/uL (ref 1.7–7.7)
Neutrophils Relative %: 71 %
Platelets: 288 10*3/uL (ref 150–400)
RBC: 4.82 MIL/uL (ref 4.22–5.81)
RDW: 14.1 % (ref 11.5–15.5)
WBC: 8 10*3/uL (ref 4.0–10.5)
nRBC: 0 % (ref 0.0–0.2)

## 2020-08-26 LAB — URINALYSIS, ROUTINE W REFLEX MICROSCOPIC
Bilirubin Urine: NEGATIVE
Glucose, UA: NEGATIVE mg/dL
Ketones, ur: NEGATIVE mg/dL
Leukocytes,Ua: NEGATIVE
Nitrite: NEGATIVE
Protein, ur: 100 mg/dL — AB
Specific Gravity, Urine: >1.046 — ABNORMAL HIGH (ref 1.005–1.030)
pH: 6 (ref 5.0–8.0)

## 2020-08-26 LAB — LIPASE, BLOOD: Lipase: 17 U/L (ref 11–51)

## 2020-08-26 LAB — CBG MONITORING, ED: Glucose-Capillary: 124 mg/dL — ABNORMAL HIGH (ref 70–99)

## 2020-08-26 MED ORDER — SODIUM CHLORIDE 0.9 % IV BOLUS
1000.0000 mL | Freq: Once | INTRAVENOUS | Status: AC
  Administered 2020-08-26: 1000 mL via INTRAVENOUS

## 2020-08-26 MED ORDER — CIPROFLOXACIN HCL 500 MG PO TABS
500.0000 mg | ORAL_TABLET | Freq: Two times a day (BID) | ORAL | 0 refills | Status: DC
Start: 2020-08-26 — End: 2021-04-04

## 2020-08-26 MED ORDER — METRONIDAZOLE 500 MG PO TABS
500.0000 mg | ORAL_TABLET | Freq: Once | ORAL | Status: AC
  Administered 2020-08-26: 500 mg via ORAL
  Filled 2020-08-26: qty 1

## 2020-08-26 MED ORDER — IOHEXOL 300 MG/ML  SOLN
100.0000 mL | Freq: Once | INTRAMUSCULAR | Status: AC | PRN
  Administered 2020-08-26: 100 mL via INTRAVENOUS

## 2020-08-26 MED ORDER — MORPHINE SULFATE (PF) 4 MG/ML IV SOLN
4.0000 mg | Freq: Once | INTRAVENOUS | Status: AC
  Administered 2020-08-26: 4 mg via INTRAVENOUS
  Filled 2020-08-26 (×2): qty 1

## 2020-08-26 MED ORDER — SODIUM CHLORIDE 0.9 % IV BOLUS
1000.0000 mL | Freq: Once | INTRAVENOUS | Status: AC
Start: 2020-08-26 — End: 2020-08-26
  Administered 2020-08-26: 1000 mL via INTRAVENOUS

## 2020-08-26 MED ORDER — CIPROFLOXACIN HCL 500 MG PO TABS
500.0000 mg | ORAL_TABLET | Freq: Once | ORAL | Status: AC
  Administered 2020-08-26: 500 mg via ORAL
  Filled 2020-08-26: qty 1

## 2020-08-26 MED ORDER — ALIGN 4 MG PO CAPS: 1.0000 | ORAL_CAPSULE | Freq: Every day | ORAL | 0 refills | Status: DC

## 2020-08-26 MED ORDER — METRONIDAZOLE 500 MG PO TABS: 500.0000 mg | ORAL_TABLET | Freq: Two times a day (BID) | ORAL | 0 refills | Status: DC

## 2020-08-26 MED ORDER — ONDANSETRON HCL 4 MG PO TABS: 4.0000 mg | ORAL_TABLET | Freq: Four times a day (QID) | ORAL | 0 refills | Status: DC

## 2020-08-26 MED ORDER — ONDANSETRON HCL 4 MG/2ML IJ SOLN
4.0000 mg | Freq: Once | INTRAMUSCULAR | Status: AC
  Administered 2020-08-26: 4 mg via INTRAVENOUS
  Filled 2020-08-26: qty 2

## 2020-08-26 NOTE — ED Triage Notes (Signed)
He c/a few diarrhea stools per day x 2 days. He c/o chronic epigastric discomfort "when I lay down--it got a little worse this morning and I threw up". He is in no distress. CBG 158 for EMS.

## 2020-08-26 NOTE — ED Notes (Signed)
Pt stated that he was feeling better after pain medication was administered.

## 2020-08-26 NOTE — ED Provider Notes (Signed)
[facility] EMERGENCY DEPT Provider Note   CSN: [id number] Arrival date & time: 08/26/20  1017     History Chief Complaint  Patient presents with  . Abdominal Pain    Vincent Young is a 55 y.o. male.  Pt presents to the ED with abdominal pain and diarrhea.  Pt said he's had diarrhea for 2 days.  He said if he eats, it goes right through him.  He had an episode of vomiting this am.  He denies any recent travel or abx.  Pt said he does have HIV, but his viral loads have been nearly undetectable and his CD4 counts have been good.  He has been compliant with his meds.        Past Medical History:  Diagnosis Date  . Acute renal failure (HCC)    due to dehydration  . Arthritis   . Asthma   . Coronary artery disease   . Diabetes mellitus without complication ([place])   . DJD (degenerative joint disease)   . Exposure to TB   . Family history of adverse reaction to anesthesia    " My brother has had PONV"  . GERD (gastroesophageal reflux disease)   . Headache   . HIV infection ([place])   . Hypertension   . Obesity   . OSA (obstructive sleep apnea) 07/12/2020  . Pilonidal cyst   . Recurrent genital HSV (herpes simplex virus) infection   . Sleep apnea    never tested-has s/s    Patient Active Problem List   Diagnosis Date Noted  . OSA (obstructive sleep apnea) 07/12/2020  . Need for prophylactic vaccination and inoculation against influenza 04/06/2020  . Abnormal cardiac CT angiography 03/15/2020  . Angina, class III ([place]) 03/15/2020  . Medication monitoring encounter 09/22/2019  . Bursitis of left shoulder 03/24/2019  . Depressed mood 08/06/2018  . Obesity (BMI 35.0-39.9 without comorbidity) 11/21/2016  . 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 Screening examination for venereal disease 05/31/2014  . Encounter for long-term (current) use of medications 05/31/2014  . Bronchitis, allergic 10/19/2013  . Healthcare maintenance 02/02/2013  . OBESITY, MORBID 11/02/2009   . PERIPHERAL NEUROPATHY 11/02/2009  . Herpes simplex virus (HSV) infection 11/01/2009  . TONSILLECTOMY, HX OF 11/01/2009  . Human immunodeficiency virus (HIV) disease ([place]) 10/12/2009    Past Surgical History:  Procedure Laterality Date  . APPENDECTOMY    . 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 COLONOSCOPY W/ BIOPSIES AND POLYPECTOMY    . COLONOSCOPY WITH PROPOFOL N/A 08/29/2014   Procedure: COLONOSCOPY WITH PROPOFOL;  Surgeon: Clarene Essex, MD;  Location: [facility];  Service: Endoscopy;  Laterality: N/A;  . COLONOSCOPY WITH PROPOFOL N/A 11/16/2019   Procedure: COLONOSCOPY WITH PROPOFOL;  Surgeon: Clarene Essex, MD;  Location: [facility];  Service: Endoscopy;  Laterality: N/A;  . HERNIA REPAIR     inguinal as a child  . LEFT HEART CATH AND CORONARY ANGIOGRAPHY N/A 03/15/2020   Procedure: LEFT HEART CATH AND CORONARY ANGIOGRAPHY;  Surgeon: Leonie Man, MD;  Location: [facility];  Service: Cardiovascular;  Laterality: N/A;  . PEG TUBE PLACEMENT  2009  . POLYPECTOMY  11/16/2019   Procedure: POLYPECTOMY;  Surgeon: Clarene Essex, MD;  Location: [facility];  Service: Endoscopy;;  . TONSILLECTOMY    .  UVULECTOMY  2009       Family History  Problem Relation Age of Onset  . Heart disease Father   . Hypertension Father   . Hypertension Mother   . Arthritis Mother   . Hypertension Sister   . Arthritis Sister   . Hypertension Brother     Social History   Tobacco Use  . Smoking status: Former Smoker    Years: 30.00    Quit date: 12/04/2003    Years since quitting: 16.7  . Smokeless tobacco: Never Used  . Tobacco comment: pt. no longer smokes  Vaping Use  . Vaping Use: Never used  Substance Use Topics   . Alcohol use: Yes    Alcohol/week: 0.0 standard drinks    Comment: occasional  . Drug use: Not Currently    Comment: occasional    Home Medications Prior to Admission medications   Medication Sig Start Date End Date Taking? Authorizing Provider  ciprofloxacin (CIPRO) 500 MG tablet Take 1 tablet (500 mg total) by mouth every 12 (twelve) hours. 08/26/20  Yes Isla Pence, MD  metroNIDAZOLE (FLAGYL) 500 MG tablet Take 1 tablet (500 mg total) by mouth 2 (two) times daily. 08/26/20  Yes Isla Pence, MD  ondansetron (ZOFRAN) 4 MG tablet Take 1 tablet (4 mg total) by mouth every 6 (six) hours. 08/26/20  Yes Isla Pence, MD  Probiotic Product (ALIGN) 4 MG CAPS Take 1 capsule (4 mg total) by mouth daily. 08/26/20  Yes Isla Pence, MD  Accu-Chek Softclix Lancets lancets SMARTSIG:Topical 03/10/20   [provider]  albuterol (PROVENTIL HFA;VENTOLIN HFA) 108 (90 BASE) MCG/ACT inhaler Inhale 1-2 puffs into the lungs every 6 (six) hours as needed for wheezing or shortness of breath. 05/15/14   Rancour, Annie Main, MD  amLODipine (NORVASC) 5 MG tablet Take 5 mg by mouth daily.  06/25/18   [provider]  aspirin EC 81 MG tablet Take 1 tablet (81 mg total) by mouth daily. Swallow whole. 03/01/20   O'NealCassie Freer, MD  atorvastatin (LIPITOR) 80 MG tablet Take 1 tablet (80 mg total) by mouth daily. 03/14/20   Geralynn Rile, MD  bictegravir-emtricitabine-tenofovir AF (BIKTARVY) 50-200-25 MG TABS tablet TAKE 1 TABLET BY MOUTH DAILY. 04/11/20 04/11/21  Thayer Headings, MD  diphenhydrAMINE (BENADRYL) 25 MG tablet Take 2 tablets (50 mg total) by mouth every 8 (eight) hours as needed (allergic reaction). 04/03/14   Jola Schmidt, MD  fluticasone (FLONASE) 50 MCG/ACT nasal spray Place 2 sprays into both nostrils daily as needed for allergies.  06/01/18   [provider]  ketoconazole (NIZORAL) 2 % cream Apply 1 application topically daily. 08/21/20   McDonald, Stephan Minister, DPM   loratadine (CLARITIN) 10 MG tablet Take 10 mg by mouth daily as needed for allergies.     [provider]  losartan (COZAAR) 50 MG tablet Take 50 mg by mouth daily. 11/25/19   [provider]  meloxicam (MOBIC) 15 MG tablet Take 15 mg by mouth daily. 10/26/19   [provider]  metFORMIN (GLUCOPHAGE) 500 MG tablet Take 500 mg by mouth daily with breakfast.    [provider]  metoprolol tartrate (LOPRESSOR) 100 MG tablet Take 1 tablet by mouth once for procedure. 01/31/20   O'Neal, Cassie Freer, MD  neomycin-polymyxin-hydrocortisone (CORTISPORIN) OTIC solution Apply 1 to 2 drops to toe BID after soaking 03/22/20   Regal, Tamala Fothergill, DPM  nitroGLYCERIN (NITROSTAT) 0.4 MG SL tablet Place 1 tablet (0.4 mg total) under the tongue every  5 (five) minutes as needed. 03/01/20 05/30/20  O'NealCassie Freer, MD  omeprazole (PRILOSEC) 20 MG capsule Take 1 capsule (20 mg total) by mouth daily. 04/07/20   O'NealCassie Freer, MD  terazosin (HYTRIN) 2 MG capsule Take 2 mg by mouth at bedtime.  08/27/18   [provider]  testosterone cypionate (DEPOTESTOSTERONE CYPIONATE) 200 MG/ML injection Inject 200 mg into the muscle every 14 (fourteen) days.  01/07/18   [provider]  valACYclovir (VALTREX) 500 MG tablet TAKE 2 TABLETS BY MOUTH DAILY Patient taking differently: Take 500 mg by mouth 2 (two) times daily.  02/01/20 01/31/21  Thayer Headings, MD    Allergies    Black walnut pollen allergy skin test, Shellfish allergy, Other, and Penicillins  Review of Systems   Review of Systems  Gastrointestinal: Positive for abdominal pain, diarrhea, nausea and vomiting.  All other systems reviewed and are negative.   Physical Exam Updated Vital Signs BP 131/87 (BP Location: Left Arm)   Pulse 100   Temp 98.7 F (37.1 C) (Oral)   Resp 16   SpO2 97%   Physical Exam Vitals and nursing note reviewed.  Constitutional:      Appearance: He is well-developed. He is  obese.  HENT:     Head: Normocephalic and atraumatic.     Mouth/Throat:     Mouth: Mucous membranes are dry.  Eyes:     Extraocular Movements: Extraocular movements intact.     Pupils: Pupils are equal, round, and reactive to light.  Cardiovascular:     Rate and Rhythm: Regular rhythm. Tachycardia present.  Pulmonary:     Effort: Pulmonary effort is normal.     Breath sounds: Normal breath sounds.  Abdominal:     General: Abdomen is flat. Bowel sounds are normal.     Palpations: Abdomen is soft.     Tenderness: There is abdominal tenderness in the epigastric area and left lower quadrant.  Skin:    General: Skin is warm.     Capillary Refill: Capillary refill takes less than 2 seconds.  Neurological:     General: No focal deficit present.     Mental Status: He is alert and oriented to person, place, and time.  Psychiatric:        Mood and Affect: Mood normal.        Behavior: Behavior normal.     ED Results / Procedures / Treatments   Labs (all labs ordered are listed, but only abnormal results are displayed) Labs Reviewed  COMPREHENSIVE METABOLIC PANEL - Abnormal; Notable for the following components:      Result Value   Glucose, Bld 129 (*)    Creatinine, Ser 1.46 (*)    Total Protein 8.9 (*)    ALT 47 (*)    GFR, Estimated 56 (*)    All other components within normal limits  URINALYSIS, ROUTINE W REFLEX MICROSCOPIC - Abnormal; Notable for the following components:   Specific Gravity, Urine >1.046 (*)    Hgb urine dipstick TRACE (*)    Protein, ur 100 (*)    All other components within normal limits  CBG MONITORING, ED - Abnormal; Notable for the following components:   Glucose-Capillary 124 (*)    All other components within normal limits  GASTROINTESTINAL PANEL BY PCR, STOOL (REPLACES STOOL CULTURE)  C DIFFICILE QUICK SCREEN W PCR REFLEX  CBC WITH DIFFERENTIAL/PLATELET  LIPASE, BLOOD    EKG EKG Interpretation  Date/Time:  Saturday August 26 2020 10:22:01  EDT Ventricular Rate:  113 PR Interval:  148 QRS Duration: 85 QT Interval:  319 QTC Calculation: 438 R Axis:   76 Text Interpretation: Sinus tachycardia Low voltage, precordial leads SINCE LAST TRACING HEART RATE HAS INCREASED Confirmed by Isla Pence [phone]) on 08/26/2020 11:28:32 AM   Radiology CT Abdomen Pelvis W Contrast  Result Date: 08/26/2020 CLINICAL DATA:  Abdominal pain with nausea and vomiting EXAM: CT ABDOMEN AND PELVIS WITH CONTRAST TECHNIQUE: Multidetector CT imaging of the abdomen and pelvis was performed using the standard protocol following bolus administration of intravenous contrast. CONTRAST:  121mL OMNIPAQUE IOHEXOL 300 MG/ML  SOLN COMPARISON:  March 20, 2015 FINDINGS: Lower chest: Lung bases are clear. Hepatobiliary: No focal liver lesions are appreciable. There is no appreciable bowel wall thickening. No biliary duct dilatation. Pancreas: No pancreatic mass or inflammatory focus. Spleen: No splenic lesions are evident. Adrenals/Urinary Tract: Adrenals bilaterally appear normal. There is no renal mass or hydronephrosis on either side. There is no evident renal or ureteral calculus on either side. Urinary bladder is midline with wall thickness within normal limits. Stomach/Bowel: There are loops of dilated small bowel with relative transition zone in the mid to distal jejunal level consistent with a degree of bowel obstruction. No appreciable bowel wall thickening noted. There is moderate fluid and air in the stomach without gastric wall thickening. There are sigmoid diverticula without demonstrable diverticulitis. There is moderate fluid within colon without frank colonic distension. No colonic wall thickening evident. Appendix is absent. No periappendiceal region inflammation. The terminal ileum appears normal. No free air or portal venous air is evident. Vascular/Lymphatic: No abdominal aortic aneurysm. There are foci of aortic and iliac artery atherosclerosis. Major venous  structures appear patent. No evident adenopathy in the abdomen or pelvis. Reproductive: Prostate and seminal vesicles are normal in size and contour. Other: No evident abscess or ascites in the abdomen or pelvis. Musculoskeletal: There is degenerative change throughout the lumbar spine. No blastic or lytic bone lesions are evident. No appreciable abdominal wall or intramuscular lesions. IMPRESSION: 1. Proximal small bowel dilatation with a relative transition zone in the mid to distal jejunal level, consistent with a degree of bowel obstruction. No free air. Fluid throughout much of the colon and small bowel may indicate a degree of underlying enterocolitis. 2.  Sigmoid diverticula without diverticulitis. 3.  Appendix absent.  No periappendiceal region inflammation. 4.  No abscess appreciable in the abdomen or pelvis. 5. No renal or ureteral calculi. No hydronephrosis. Urinary bladder wall thickness within normal limits. 6.  Aortic Atherosclerosis (ICD10-I70.0). Electronically Signed   By: Lowella Grip III M.D.   On: 08/26/2020 12:28    Procedures Procedures   Medications Ordered in ED Medications  ciprofloxacin (CIPRO) tablet 500 mg (has no administration in time range)  metroNIDAZOLE (FLAGYL) tablet 500 mg (has no administration in time range)  ondansetron (ZOFRAN) injection 4 mg (4 mg Intravenous Given 08/26/20 1046)  sodium chloride 0.9 % bolus 1,000 mL (0 mLs Intravenous Stopped 08/26/20 1352)  morphine 4 MG/ML injection 4 mg (4 mg Intravenous Given 08/26/20 1113)  iohexol (OMNIPAQUE) 300 MG/ML solution 100 mL (100 mLs Intravenous Contrast Given 08/26/20 1146)  sodium chloride 0.9 % bolus 1,000 mL (1,000 mLs Intravenous New Bag/Given 08/26/20 1352)    ED Course  I have reviewed the triage vital signs and the nursing notes.  Pertinent labs & imaging results that were available during my care of the patient were reviewed by me and considered in my medical decision making (  see chart for  details).    MDM Rules/Calculators/A&P                          Pt is feeling much better.  He is able to tolerate po fluids.  His CT scan questions sbo.  However, pt clinically does not fit that picture.  Due to the severe diarrhea + HIV hx, I am going to start him on cipro/flagyl and probiotics.  He knows to return if his sx worsen.  Final Clinical Impression(s) / ED Diagnoses Final diagnoses:  Dehydration  Diarrhea, unspecified type  Non-intractable vomiting with nausea, unspecified vomiting type    Rx / DC Orders ED Discharge Orders         Ordered    ondansetron (ZOFRAN) 4 MG tablet  Every 6 hours        08/26/20 1505    ciprofloxacin (CIPRO) 500 MG tablet  Every 12 hours        08/26/20 1505    metroNIDAZOLE (FLAGYL) 500 MG tablet  2 times daily        08/26/20 1505    Probiotic Product (ALIGN) 4 MG CAPS  Daily        08/26/20 1505           Isla Pence, MD 08/26/20 [phone]

## 2020-08-26 NOTE — Discharge Instructions (Signed)
If your symptoms worsen, you need to return to the emergency department.

## 2020-09-12 ENCOUNTER — Other Ambulatory Visit (HOSPITAL_COMMUNITY): Payer: Self-pay

## 2020-09-12 MED FILL — Valacyclovir HCl Tab 500 MG: ORAL | 30 days supply | Qty: 60 | Fill #0 | Status: AC

## 2020-09-12 MED FILL — Valacyclovir HCl Tab 500 MG: ORAL | 30 days supply | Qty: 60 | Fill #0 | Status: CN

## 2020-09-14 DIAGNOSIS — E7800 Pure hypercholesterolemia, unspecified: Secondary | ICD-10-CM | POA: Diagnosis not present

## 2020-09-14 DIAGNOSIS — E785 Hyperlipidemia, unspecified: Secondary | ICD-10-CM | POA: Diagnosis not present

## 2020-09-14 DIAGNOSIS — I10 Essential (primary) hypertension: Secondary | ICD-10-CM | POA: Diagnosis not present

## 2020-09-14 DIAGNOSIS — M170 Bilateral primary osteoarthritis of knee: Secondary | ICD-10-CM | POA: Diagnosis not present

## 2020-09-14 DIAGNOSIS — I2510 Atherosclerotic heart disease of native coronary artery without angina pectoris: Secondary | ICD-10-CM | POA: Diagnosis not present

## 2020-09-14 DIAGNOSIS — E1169 Type 2 diabetes mellitus with other specified complication: Secondary | ICD-10-CM | POA: Diagnosis not present

## 2020-09-14 DIAGNOSIS — E291 Testicular hypofunction: Secondary | ICD-10-CM | POA: Diagnosis not present

## 2020-09-14 DIAGNOSIS — G8929 Other chronic pain: Secondary | ICD-10-CM | POA: Diagnosis not present

## 2020-09-14 DIAGNOSIS — K219 Gastro-esophageal reflux disease without esophagitis: Secondary | ICD-10-CM | POA: Diagnosis not present

## 2020-09-14 DIAGNOSIS — J4541 Moderate persistent asthma with (acute) exacerbation: Secondary | ICD-10-CM | POA: Diagnosis not present

## 2020-09-15 ENCOUNTER — Other Ambulatory Visit (HOSPITAL_COMMUNITY): Payer: Self-pay

## 2020-09-15 MED FILL — Bictegravir-Emtricitabine-Tenofovir AF Tab 50-200-25 MG: ORAL | 30 days supply | Qty: 30 | Fill #1 | Status: AC

## 2020-09-18 ENCOUNTER — Other Ambulatory Visit: Payer: Medicare HMO

## 2020-09-19 DIAGNOSIS — J4541 Moderate persistent asthma with (acute) exacerbation: Secondary | ICD-10-CM | POA: Diagnosis not present

## 2020-09-19 DIAGNOSIS — I2510 Atherosclerotic heart disease of native coronary artery without angina pectoris: Secondary | ICD-10-CM | POA: Diagnosis not present

## 2020-09-19 DIAGNOSIS — Z125 Encounter for screening for malignant neoplasm of prostate: Secondary | ICD-10-CM | POA: Diagnosis not present

## 2020-09-19 DIAGNOSIS — I10 Essential (primary) hypertension: Secondary | ICD-10-CM | POA: Diagnosis not present

## 2020-09-19 DIAGNOSIS — E785 Hyperlipidemia, unspecified: Secondary | ICD-10-CM | POA: Diagnosis not present

## 2020-09-19 DIAGNOSIS — E7800 Pure hypercholesterolemia, unspecified: Secondary | ICD-10-CM | POA: Diagnosis not present

## 2020-09-19 DIAGNOSIS — G8929 Other chronic pain: Secondary | ICD-10-CM | POA: Diagnosis not present

## 2020-09-19 DIAGNOSIS — E1169 Type 2 diabetes mellitus with other specified complication: Secondary | ICD-10-CM | POA: Diagnosis not present

## 2020-09-19 DIAGNOSIS — K219 Gastro-esophageal reflux disease without esophagitis: Secondary | ICD-10-CM | POA: Diagnosis not present

## 2020-09-19 DIAGNOSIS — M170 Bilateral primary osteoarthritis of knee: Secondary | ICD-10-CM | POA: Diagnosis not present

## 2020-09-19 DIAGNOSIS — E291 Testicular hypofunction: Secondary | ICD-10-CM | POA: Diagnosis not present

## 2020-09-19 DIAGNOSIS — R809 Proteinuria, unspecified: Secondary | ICD-10-CM | POA: Diagnosis not present

## 2020-09-20 ENCOUNTER — Other Ambulatory Visit: Payer: Self-pay

## 2020-09-20 ENCOUNTER — Other Ambulatory Visit: Payer: Medicare HMO

## 2020-09-20 DIAGNOSIS — B20 Human immunodeficiency virus [HIV] disease: Secondary | ICD-10-CM

## 2020-09-20 DIAGNOSIS — M109 Gout, unspecified: Secondary | ICD-10-CM | POA: Diagnosis not present

## 2020-09-20 DIAGNOSIS — Z113 Encounter for screening for infections with a predominantly sexual mode of transmission: Secondary | ICD-10-CM | POA: Diagnosis not present

## 2020-09-20 DIAGNOSIS — R69 Illness, unspecified: Secondary | ICD-10-CM | POA: Diagnosis not present

## 2020-09-20 DIAGNOSIS — I10 Essential (primary) hypertension: Secondary | ICD-10-CM | POA: Diagnosis not present

## 2020-09-21 LAB — T-HELPER CELL (CD4) - (RCID CLINIC ONLY)
CD4 % Helper T Cell: 24 % — ABNORMAL LOW (ref 33–65)
CD4 T Cell Abs: 733 /uL (ref 400–1790)

## 2020-09-24 LAB — COMPLETE METABOLIC PANEL WITH GFR
AG Ratio: 1.2 (calc) (ref 1.0–2.5)
ALT: 18 U/L (ref 9–46)
AST: 16 U/L (ref 10–35)
Albumin: 3.9 g/dL (ref 3.6–5.1)
Alkaline phosphatase (APISO): 107 U/L (ref 35–144)
BUN: 10 mg/dL (ref 7–25)
CO2: 29 mmol/L (ref 20–32)
Calcium: 9.5 mg/dL (ref 8.6–10.3)
Chloride: 104 mmol/L (ref 98–110)
Creat: 0.98 mg/dL (ref 0.70–1.33)
GFR, Est African American: 99 mL/min/{1.73_m2} (ref >=60)
GFR, Est Non African American: 85 mL/min/{1.73_m2} (ref >=60)
Globulin: 3.2 g/dL (calc) (ref 1.9–3.7)
Glucose, Bld: 105 mg/dL — ABNORMAL HIGH (ref 65–99)
Potassium: 4 mmol/L (ref 3.5–5.3)
Sodium: 139 mmol/L (ref 135–146)
Total Bilirubin: 0.5 mg/dL (ref 0.2–1.2)
Total Protein: 7.1 g/dL (ref 6.1–8.1)

## 2020-09-24 LAB — CBC WITH DIFFERENTIAL/PLATELET
Absolute Monocytes: 721 cells/uL (ref 200–950)
Basophils Absolute: 9 cells/uL (ref 0–200)
Basophils Relative: 0.1 %
Eosinophils Absolute: 231 cells/uL (ref 15–500)
Eosinophils Relative: 2.6 %
HCT: 38.2 % — ABNORMAL LOW (ref 38.5–50.0)
Hemoglobin: 12.9 g/dL — ABNORMAL LOW (ref 13.2–17.1)
Lymphs Abs: 3373 cells/uL (ref 850–3900)
MCH: 31.5 pg (ref 27.0–33.0)
MCHC: 33.8 g/dL (ref 32.0–36.0)
MCV: 93.2 fL (ref 80.0–100.0)
MPV: 9.2 fL (ref 7.5–12.5)
Monocytes Relative: 8.1 %
Neutro Abs: 4566 cells/uL (ref 1500–7800)
Neutrophils Relative %: 51.3 %
Platelets: 302 10*3/uL (ref 140–400)
RBC: 4.1 10*6/uL — ABNORMAL LOW (ref 4.20–5.80)
RDW: 13.6 % (ref 11.0–15.0)
Total Lymphocyte: 37.9 %
WBC: 8.9 10*3/uL (ref 3.8–10.8)

## 2020-09-24 LAB — HIV-1 RNA QUANT-NO REFLEX-BLD
HIV 1 RNA Quant: 64 Copies/mL — ABNORMAL HIGH
HIV-1 RNA Quant, Log: 1.81 Log cps/mL — ABNORMAL HIGH

## 2020-09-24 LAB — RPR: RPR Ser Ql: NONREACTIVE

## 2020-09-25 ENCOUNTER — Other Ambulatory Visit (HOSPITAL_COMMUNITY): Payer: Self-pay

## 2020-09-28 DIAGNOSIS — M170 Bilateral primary osteoarthritis of knee: Secondary | ICD-10-CM | POA: Diagnosis not present

## 2020-10-02 DIAGNOSIS — E291 Testicular hypofunction: Secondary | ICD-10-CM | POA: Diagnosis not present

## 2020-10-03 ENCOUNTER — Other Ambulatory Visit (HOSPITAL_COMMUNITY): Payer: Self-pay

## 2020-10-03 ENCOUNTER — Other Ambulatory Visit: Payer: Self-pay

## 2020-10-03 ENCOUNTER — Ambulatory Visit (INDEPENDENT_AMBULATORY_CARE_PROVIDER_SITE_OTHER): Payer: Medicare HMO | Admitting: Internal Medicine

## 2020-10-03 ENCOUNTER — Encounter: Payer: Self-pay | Admitting: Internal Medicine

## 2020-10-03 VITALS — BP 131/84 | HR 98 | Wt 389.0 lb

## 2020-10-03 DIAGNOSIS — Z113 Encounter for screening for infections with a predominantly sexual mode of transmission: Secondary | ICD-10-CM | POA: Diagnosis not present

## 2020-10-03 DIAGNOSIS — B20 Human immunodeficiency virus [HIV] disease: Secondary | ICD-10-CM

## 2020-10-03 DIAGNOSIS — Z79899 Other long term (current) drug therapy: Secondary | ICD-10-CM

## 2020-10-03 DIAGNOSIS — R69 Illness, unspecified: Secondary | ICD-10-CM | POA: Diagnosis not present

## 2020-10-03 DIAGNOSIS — E669 Obesity, unspecified: Secondary | ICD-10-CM

## 2020-10-03 NOTE — Assessment & Plan Note (Signed)
Discussed weight loss and he is motivated to improve his health

## 2020-10-03 NOTE — Assessment & Plan Note (Signed)
Will check his lipid panel 

## 2020-10-03 NOTE — Assessment & Plan Note (Signed)
He continues to do well on his Biktarvy and unfortunately due to the mutation history is not a candidate for Cabenuva at this time.  rtc in 6 months.

## 2020-10-03 NOTE — Progress Notes (Signed)
   Subjective:    Patient ID: Vincent Young, male    DOB: [birth year], 55 y.o.   MRN: [id number]  HPI Here for follow up of HIV He continues on biktarvy and no missed doses.  Feels well and no issues with no issues getting, taking or tolerating the medication.  Has a known 184V mutation. Recently lost his mother unexpectedly.  He is interested in [place].  He is interested in trying to lose weight and will start water aerobics this summer.    Review of Systems  Constitutional: Negative for fatigue.  Gastrointestinal: Negative for diarrhea and nausea.  Skin: Negative for rash.       Objective:   Physical Exam Eyes:     General: No scleral icterus. Pulmonary:     Effort: Pulmonary effort is normal.  Neurological:     General: No focal deficit present.     Mental Status: He is alert.  Psychiatric:        Mood and Affect: Mood normal.   SH: former smoker        Assessment & Plan:

## 2020-10-03 NOTE — Assessment & Plan Note (Signed)
Discussed weight loss and he is motivated to improve his health 

## 2020-10-03 NOTE — Assessment & Plan Note (Signed)
Will screen Condoms provided

## 2020-10-04 ENCOUNTER — Telehealth: Payer: Self-pay | Admitting: Pharmacist

## 2020-10-04 NOTE — Telephone Encounter (Signed)
Cumulative HIV Genotype Data  RT Mutations M184V, K219Q, , P225H  K70R D67N, L74I, T69N,  K103N, K101QL100I    PI Mutations  N/A  Integrase Mutations  N/A   Interpretation of Genotype Data per [employer] HIV Drug Resistance Database:  Nucleoside RTIs  Abacavir - high level resistance Zidovudine - intermediate resistance Emtricitabine - high level resistance Lamivudine - high level resistance Tenofovir - low level resistance   Non-Nucleoside RTIs  Doravirine - high level resistance Efavirenz - high level resistance Etravirine - intermediate resistance Nevirapine - high level resistance Rilpivirine - high level resistance   Protease Inhibitors  Atazanavir - N/A Darunavir - N/A Lopinavir - N/A   Integrase Inhibitors  Bictegravir - N/A Dolutegravir - N/A Elvitegravir - N/A Raltegravir - N/A   Unfortunately, patient is not a candidate for Cabenuva since he has high level resistance to rilpivirine.   Julann Mcgilvray L. Kylo Gavin, PharmD, BCIDP, AAHIVP, CPP Clinical Pharmacist Practitioner Infectious Diseases [facility] 10/04/2020, 11:45 AM

## 2020-10-06 DIAGNOSIS — E785 Hyperlipidemia, unspecified: Secondary | ICD-10-CM | POA: Diagnosis not present

## 2020-10-06 DIAGNOSIS — J4541 Moderate persistent asthma with (acute) exacerbation: Secondary | ICD-10-CM | POA: Diagnosis not present

## 2020-10-06 DIAGNOSIS — K219 Gastro-esophageal reflux disease without esophagitis: Secondary | ICD-10-CM | POA: Diagnosis not present

## 2020-10-06 DIAGNOSIS — I2510 Atherosclerotic heart disease of native coronary artery without angina pectoris: Secondary | ICD-10-CM | POA: Diagnosis not present

## 2020-10-06 DIAGNOSIS — I10 Essential (primary) hypertension: Secondary | ICD-10-CM | POA: Diagnosis not present

## 2020-10-06 DIAGNOSIS — E1169 Type 2 diabetes mellitus with other specified complication: Secondary | ICD-10-CM | POA: Diagnosis not present

## 2020-10-06 DIAGNOSIS — E119 Type 2 diabetes mellitus without complications: Secondary | ICD-10-CM | POA: Diagnosis not present

## 2020-10-06 DIAGNOSIS — E782 Mixed hyperlipidemia: Secondary | ICD-10-CM | POA: Diagnosis not present

## 2020-10-06 DIAGNOSIS — G8929 Other chronic pain: Secondary | ICD-10-CM | POA: Diagnosis not present

## 2020-10-13 ENCOUNTER — Other Ambulatory Visit (HOSPITAL_COMMUNITY): Payer: Self-pay

## 2020-10-17 ENCOUNTER — Other Ambulatory Visit (HOSPITAL_COMMUNITY): Payer: Self-pay

## 2020-10-17 DIAGNOSIS — E291 Testicular hypofunction: Secondary | ICD-10-CM | POA: Diagnosis not present

## 2020-10-17 MED FILL — Bictegravir-Emtricitabine-Tenofovir AF Tab 50-200-25 MG: ORAL | 30 days supply | Qty: 30 | Fill #2 | Status: AC

## 2020-10-19 ENCOUNTER — Other Ambulatory Visit (HOSPITAL_COMMUNITY): Payer: Self-pay

## 2020-10-30 DIAGNOSIS — E291 Testicular hypofunction: Secondary | ICD-10-CM | POA: Diagnosis not present

## 2020-11-03 ENCOUNTER — Ambulatory Visit (INDEPENDENT_AMBULATORY_CARE_PROVIDER_SITE_OTHER): Payer: Medicare HMO

## 2020-11-03 ENCOUNTER — Other Ambulatory Visit: Payer: Self-pay

## 2020-11-03 DIAGNOSIS — Z23 Encounter for immunization: Secondary | ICD-10-CM | POA: Diagnosis not present

## 2020-11-03 NOTE — Progress Notes (Signed)
   Covid-19 Vaccination Clinic  Name:  Vincent Young    MRN: [id number] DOB: [birth year]  11/03/2020  Mr. [name] was observed post Covid-19 immunization for 15 minutes without incident. He was provided with Vaccine Information Sheet and instruction to access the V-Safe system.   Mr. [name] was instructed to call 911 with any severe reactions post vaccine: Difficulty breathing  Swelling of face and throat  A fast heartbeat  A bad rash all over body  Dizziness and weakness     Carlean Purl, RN

## 2020-11-13 ENCOUNTER — Other Ambulatory Visit (HOSPITAL_COMMUNITY): Payer: Self-pay

## 2020-11-13 ENCOUNTER — Other Ambulatory Visit: Payer: Self-pay | Admitting: Internal Medicine

## 2020-11-13 DIAGNOSIS — B20 Human immunodeficiency virus [HIV] disease: Secondary | ICD-10-CM

## 2020-11-13 DIAGNOSIS — E291 Testicular hypofunction: Secondary | ICD-10-CM | POA: Diagnosis not present

## 2020-11-13 MED ORDER — BIKTARVY 50-200-25 MG PO TABS
1.0000 | ORAL_TABLET | Freq: Every day | ORAL | 5 refills | Status: DC
  Filled 2020-11-24 – 2020-11-29 (×2): qty 30, 30d supply, fill #0
  Filled 2020-12-21: qty 30, 30d supply, fill #1
  Filled 2021-02-01: qty 30, 30d supply, fill #2
  Filled 2021-02-23: qty 30, 30d supply, fill #3
  Filled 2021-04-20: qty 30, 30d supply, fill #4
  Filled 2021-05-31 – 2021-06-05 (×2): qty 30, 30d supply, fill #5

## 2020-11-15 ENCOUNTER — Other Ambulatory Visit: Payer: Self-pay | Admitting: Family

## 2020-11-15 DIAGNOSIS — B20 Human immunodeficiency virus [HIV] disease: Secondary | ICD-10-CM

## 2020-11-24 ENCOUNTER — Other Ambulatory Visit (HOSPITAL_COMMUNITY): Payer: Self-pay

## 2020-11-24 MED FILL — Valacyclovir HCl Tab 500 MG: ORAL | 30 days supply | Qty: 60 | Fill #1 | Status: AC

## 2020-11-27 ENCOUNTER — Other Ambulatory Visit (HOSPITAL_COMMUNITY): Payer: Self-pay

## 2020-11-27 DIAGNOSIS — E291 Testicular hypofunction: Secondary | ICD-10-CM | POA: Diagnosis not present

## 2020-11-28 ENCOUNTER — Other Ambulatory Visit (HOSPITAL_COMMUNITY): Payer: Self-pay

## 2020-11-29 ENCOUNTER — Other Ambulatory Visit (HOSPITAL_COMMUNITY): Payer: Self-pay

## 2020-11-30 ENCOUNTER — Other Ambulatory Visit (HOSPITAL_COMMUNITY): Payer: Self-pay

## 2020-12-01 ENCOUNTER — Other Ambulatory Visit (HOSPITAL_COMMUNITY): Payer: Self-pay

## 2020-12-11 DIAGNOSIS — E291 Testicular hypofunction: Secondary | ICD-10-CM | POA: Diagnosis not present

## 2020-12-12 ENCOUNTER — Telehealth: Payer: Self-pay | Admitting: Podiatry

## 2020-12-12 NOTE — Telephone Encounter (Signed)
Pt called and stated he is in severe pain and he stands about 5 hours a day for 6 days a week. He is severe pain- he has been taking all these pain meds and nothing seems to work. He wanted to know what he can do. He needs some type of pain relief.

## 2020-12-17 DIAGNOSIS — J4541 Moderate persistent asthma with (acute) exacerbation: Secondary | ICD-10-CM | POA: Diagnosis not present

## 2020-12-17 DIAGNOSIS — E1169 Type 2 diabetes mellitus with other specified complication: Secondary | ICD-10-CM | POA: Diagnosis not present

## 2020-12-17 DIAGNOSIS — G8929 Other chronic pain: Secondary | ICD-10-CM | POA: Diagnosis not present

## 2020-12-17 DIAGNOSIS — E119 Type 2 diabetes mellitus without complications: Secondary | ICD-10-CM | POA: Diagnosis not present

## 2020-12-17 DIAGNOSIS — E782 Mixed hyperlipidemia: Secondary | ICD-10-CM | POA: Diagnosis not present

## 2020-12-17 DIAGNOSIS — I10 Essential (primary) hypertension: Secondary | ICD-10-CM | POA: Diagnosis not present

## 2020-12-17 DIAGNOSIS — M170 Bilateral primary osteoarthritis of knee: Secondary | ICD-10-CM | POA: Diagnosis not present

## 2020-12-17 DIAGNOSIS — K219 Gastro-esophageal reflux disease without esophagitis: Secondary | ICD-10-CM | POA: Diagnosis not present

## 2020-12-17 DIAGNOSIS — E7800 Pure hypercholesterolemia, unspecified: Secondary | ICD-10-CM | POA: Diagnosis not present

## 2020-12-17 DIAGNOSIS — I2510 Atherosclerotic heart disease of native coronary artery without angina pectoris: Secondary | ICD-10-CM | POA: Diagnosis not present

## 2020-12-18 ENCOUNTER — Ambulatory Visit: Payer: Medicare HMO | Admitting: Podiatry

## 2020-12-18 ENCOUNTER — Other Ambulatory Visit: Payer: Self-pay

## 2020-12-18 ENCOUNTER — Encounter: Payer: Self-pay | Admitting: Podiatry

## 2020-12-18 DIAGNOSIS — M2142 Flat foot [pes planus] (acquired), left foot: Secondary | ICD-10-CM | POA: Diagnosis not present

## 2020-12-18 DIAGNOSIS — G609 Hereditary and idiopathic neuropathy, unspecified: Secondary | ICD-10-CM

## 2020-12-18 DIAGNOSIS — E1142 Type 2 diabetes mellitus with diabetic polyneuropathy: Secondary | ICD-10-CM

## 2020-12-18 DIAGNOSIS — M2141 Flat foot [pes planus] (acquired), right foot: Secondary | ICD-10-CM | POA: Diagnosis not present

## 2020-12-18 MED ORDER — GABAPENTIN 300 MG PO CAPS: ORAL_CAPSULE | ORAL | 3 refills | Status: DC

## 2020-12-18 NOTE — Progress Notes (Signed)
  Subjective:  Patient ID: Vincent Young, male    DOB: [birth year],  MRN: [id number]  Chief Complaint  Patient presents with   Nail Problem    4 month follow up   Foot Pain    Chronic bilateral foot pain    55 y.o. male presents with the above complaint. History confirmed with patient.  He has been having more more foot pain on the bottom of his feet  Objective:  Physical Exam: warm, good capillary refill, no trophic changes or ulcerative lesions, normal DP and PT pulses, and abnormal monofilament exam with loss of protective sensation.  Pes planus deformity Assessment:   1. Hereditary and idiopathic peripheral neuropathy   2. Pes planus of both feet   3. Type 2 diabetes mellitus with diabetic polyneuropathy, without long-term current use of insulin ([place])      Plan:  Patient was evaluated and treated and all questions answered.  Discussed with him this is likely diabetic peripheral neuropathy.  I recommend support with diabetic accommodative insoles and shoes.  He currently does take meloxicam and Tylenol for this as well.  Prescription for gabapentin ramp dosing was sent to his pharmacy over the 6 weeks.  See if this can get him to a comfortable pain level.  Discussed with him may need to have his primary care doctor or pain management manage this for the future  Return if symptoms worsen or fail to improve.

## 2020-12-21 ENCOUNTER — Other Ambulatory Visit (HOSPITAL_COMMUNITY): Payer: Self-pay

## 2020-12-25 DIAGNOSIS — E291 Testicular hypofunction: Secondary | ICD-10-CM | POA: Diagnosis not present

## 2020-12-27 ENCOUNTER — Telehealth: Payer: Self-pay | Admitting: Podiatry

## 2020-12-27 NOTE — Telephone Encounter (Signed)
Patient called and stated he had some questions about medication that was just prescribed recently. Please call

## 2020-12-28 ENCOUNTER — Other Ambulatory Visit (HOSPITAL_COMMUNITY): Payer: Self-pay

## 2021-01-15 ENCOUNTER — Other Ambulatory Visit: Payer: Self-pay

## 2021-01-15 ENCOUNTER — Ambulatory Visit (INDEPENDENT_AMBULATORY_CARE_PROVIDER_SITE_OTHER): Payer: Medicare HMO | Admitting: Podiatry

## 2021-01-15 DIAGNOSIS — M2141 Flat foot [pes planus] (acquired), right foot: Secondary | ICD-10-CM

## 2021-01-15 DIAGNOSIS — E1142 Type 2 diabetes mellitus with diabetic polyneuropathy: Secondary | ICD-10-CM

## 2021-01-15 DIAGNOSIS — M2142 Flat foot [pes planus] (acquired), left foot: Secondary | ICD-10-CM

## 2021-01-15 NOTE — Progress Notes (Signed)
Patient presented for foam casting for 3 pair custom diabetic shoe inserts. Patient is to be a size 12 wide  Diabetic shoes are chosen from the safe step catalog  The shoes chosen are LT 200  The patient will be contacted when the shoes and inserts are ready to be picked up

## 2021-01-17 DIAGNOSIS — K219 Gastro-esophageal reflux disease without esophagitis: Secondary | ICD-10-CM | POA: Diagnosis not present

## 2021-01-17 DIAGNOSIS — I2510 Atherosclerotic heart disease of native coronary artery without angina pectoris: Secondary | ICD-10-CM | POA: Diagnosis not present

## 2021-01-17 DIAGNOSIS — G8929 Other chronic pain: Secondary | ICD-10-CM | POA: Diagnosis not present

## 2021-01-17 DIAGNOSIS — M170 Bilateral primary osteoarthritis of knee: Secondary | ICD-10-CM | POA: Diagnosis not present

## 2021-01-17 DIAGNOSIS — J4541 Moderate persistent asthma with (acute) exacerbation: Secondary | ICD-10-CM | POA: Diagnosis not present

## 2021-01-17 DIAGNOSIS — E119 Type 2 diabetes mellitus without complications: Secondary | ICD-10-CM | POA: Diagnosis not present

## 2021-01-17 DIAGNOSIS — I10 Essential (primary) hypertension: Secondary | ICD-10-CM | POA: Diagnosis not present

## 2021-01-17 DIAGNOSIS — Z7984 Long term (current) use of oral hypoglycemic drugs: Secondary | ICD-10-CM | POA: Diagnosis not present

## 2021-01-17 DIAGNOSIS — E1169 Type 2 diabetes mellitus with other specified complication: Secondary | ICD-10-CM | POA: Diagnosis not present

## 2021-01-17 DIAGNOSIS — E782 Mixed hyperlipidemia: Secondary | ICD-10-CM | POA: Diagnosis not present

## 2021-01-17 DIAGNOSIS — I83813 Varicose veins of bilateral lower extremities with pain: Secondary | ICD-10-CM | POA: Diagnosis not present

## 2021-01-17 DIAGNOSIS — E7800 Pure hypercholesterolemia, unspecified: Secondary | ICD-10-CM | POA: Diagnosis not present

## 2021-01-19 ENCOUNTER — Other Ambulatory Visit (HOSPITAL_COMMUNITY): Payer: Self-pay

## 2021-01-23 ENCOUNTER — Other Ambulatory Visit (HOSPITAL_COMMUNITY): Payer: Self-pay

## 2021-01-23 DIAGNOSIS — I8312 Varicose veins of left lower extremity with inflammation: Secondary | ICD-10-CM | POA: Diagnosis not present

## 2021-01-23 DIAGNOSIS — R600 Localized edema: Secondary | ICD-10-CM | POA: Diagnosis not present

## 2021-01-23 DIAGNOSIS — I8311 Varicose veins of right lower extremity with inflammation: Secondary | ICD-10-CM | POA: Diagnosis not present

## 2021-01-25 ENCOUNTER — Other Ambulatory Visit (HOSPITAL_COMMUNITY): Payer: Self-pay

## 2021-01-25 DIAGNOSIS — M25562 Pain in left knee: Secondary | ICD-10-CM | POA: Diagnosis not present

## 2021-01-25 DIAGNOSIS — M170 Bilateral primary osteoarthritis of knee: Secondary | ICD-10-CM | POA: Diagnosis not present

## 2021-01-25 DIAGNOSIS — M25561 Pain in right knee: Secondary | ICD-10-CM | POA: Diagnosis not present

## 2021-02-01 ENCOUNTER — Other Ambulatory Visit (HOSPITAL_COMMUNITY): Payer: Self-pay

## 2021-02-12 ENCOUNTER — Other Ambulatory Visit: Payer: Self-pay | Admitting: Internal Medicine

## 2021-02-12 ENCOUNTER — Other Ambulatory Visit (HOSPITAL_COMMUNITY): Payer: Self-pay

## 2021-02-12 DIAGNOSIS — B20 Human immunodeficiency virus [HIV] disease: Secondary | ICD-10-CM

## 2021-02-12 MED ORDER — VALACYCLOVIR HCL 500 MG PO TABS
ORAL_TABLET | Freq: Every day | ORAL | 5 refills | Status: DC
  Filled 2021-02-12: qty 60, 30d supply, fill #0
  Filled 2021-04-20: qty 60, 30d supply, fill #1
  Filled 2021-06-28: qty 60, 30d supply, fill #2
  Filled 2021-07-25: qty 60, 30d supply, fill #3
  Filled 2021-11-12: qty 60, 30d supply, fill #4
  Filled 2022-01-16: qty 60, 30d supply, fill #5

## 2021-02-12 NOTE — Telephone Encounter (Signed)
Ok to refill 

## 2021-02-14 ENCOUNTER — Ambulatory Visit: Payer: Medicare HMO | Admitting: Podiatry

## 2021-02-21 ENCOUNTER — Other Ambulatory Visit (HOSPITAL_COMMUNITY): Payer: Self-pay

## 2021-02-23 ENCOUNTER — Other Ambulatory Visit (HOSPITAL_COMMUNITY): Payer: Self-pay

## 2021-03-01 ENCOUNTER — Telehealth: Payer: Self-pay | Admitting: Podiatry

## 2021-03-01 NOTE — Telephone Encounter (Signed)
Diabetic shoes/inserts in..lvm for pt to call to schedule an appt to pick them up. 

## 2021-03-05 ENCOUNTER — Other Ambulatory Visit (HOSPITAL_COMMUNITY): Payer: Self-pay

## 2021-03-12 ENCOUNTER — Ambulatory Visit (INDEPENDENT_AMBULATORY_CARE_PROVIDER_SITE_OTHER): Payer: Medicare HMO | Admitting: Podiatry

## 2021-03-12 ENCOUNTER — Other Ambulatory Visit: Payer: Self-pay

## 2021-03-12 DIAGNOSIS — M2142 Flat foot [pes planus] (acquired), left foot: Secondary | ICD-10-CM | POA: Diagnosis not present

## 2021-03-12 DIAGNOSIS — E1142 Type 2 diabetes mellitus with diabetic polyneuropathy: Secondary | ICD-10-CM

## 2021-03-12 DIAGNOSIS — M2141 Flat foot [pes planus] (acquired), right foot: Secondary | ICD-10-CM | POA: Diagnosis not present

## 2021-03-12 NOTE — Progress Notes (Signed)
Patient is here today to pick up his diabetic shoes. The first set of inserts fit into the shoes well. Patient was able to slip the shoes on easily. The shoes fit well and he was very comfortable walking around in them. Advised to break them in slowly by first wearing them around the house for about an hour the first day, and to slowly increase the amount of time he wears them each day until he is able to wear them all day. Also advised to change the inserts after 4 months. Patient voiced clear understanding.

## 2021-03-13 ENCOUNTER — Other Ambulatory Visit (HOSPITAL_COMMUNITY): Payer: Self-pay

## 2021-03-15 ENCOUNTER — Other Ambulatory Visit: Payer: Self-pay | Admitting: Cardiovascular Disease

## 2021-03-21 DIAGNOSIS — R600 Localized edema: Secondary | ICD-10-CM | POA: Diagnosis not present

## 2021-03-21 DIAGNOSIS — I8311 Varicose veins of right lower extremity with inflammation: Secondary | ICD-10-CM | POA: Diagnosis not present

## 2021-03-22 ENCOUNTER — Other Ambulatory Visit: Payer: Self-pay

## 2021-03-22 ENCOUNTER — Other Ambulatory Visit: Payer: Medicare HMO

## 2021-03-22 ENCOUNTER — Other Ambulatory Visit (HOSPITAL_COMMUNITY)
Admission: RE | Admit: 2021-03-22 | Discharge: 2021-03-22 | Disposition: A | Discharge status: Home / Self Care | Payer: Medicare HMO | Source: Ambulatory Visit | Attending: Internal Medicine | Admitting: Internal Medicine

## 2021-03-22 DIAGNOSIS — B20 Human immunodeficiency virus [HIV] disease: Secondary | ICD-10-CM | POA: Diagnosis not present

## 2021-03-22 DIAGNOSIS — Z113 Encounter for screening for infections with a predominantly sexual mode of transmission: Secondary | ICD-10-CM | POA: Insufficient documentation

## 2021-03-22 DIAGNOSIS — R69 Illness, unspecified: Secondary | ICD-10-CM | POA: Diagnosis not present

## 2021-03-23 LAB — URINE CYTOLOGY ANCILLARY ONLY
Chlamydia: NEGATIVE
Comment: NEGATIVE
Comment: NORMAL
Neisseria Gonorrhea: NEGATIVE

## 2021-03-23 LAB — T-HELPER CELL (CD4) - (RCID CLINIC ONLY)
CD4 % Helper T Cell: 23 % — ABNORMAL LOW (ref 33–65)
CD4 T Cell Abs: 736 /uL (ref 400–1790)

## 2021-03-24 LAB — HIV-1 RNA QUANT-NO REFLEX-BLD
HIV 1 RNA Quant: 82 Copies/mL — ABNORMAL HIGH
HIV-1 RNA Quant, Log: 1.91 Log cps/mL — ABNORMAL HIGH

## 2021-03-27 ENCOUNTER — Other Ambulatory Visit (HOSPITAL_COMMUNITY): Payer: Self-pay

## 2021-04-04 ENCOUNTER — Ambulatory Visit (INDEPENDENT_AMBULATORY_CARE_PROVIDER_SITE_OTHER): Payer: Medicare HMO

## 2021-04-04 ENCOUNTER — Ambulatory Visit: Payer: Medicare HMO | Admitting: Internal Medicine

## 2021-04-04 ENCOUNTER — Other Ambulatory Visit: Payer: Self-pay

## 2021-04-04 ENCOUNTER — Encounter: Payer: Self-pay | Admitting: Internal Medicine

## 2021-04-04 VITALS — BP 158/89 | HR 98 | Temp 98.4°F | Resp 16 | Ht 71.0 in | Wt 380.8 lb

## 2021-04-04 DIAGNOSIS — B20 Human immunodeficiency virus [HIV] disease: Secondary | ICD-10-CM

## 2021-04-04 DIAGNOSIS — Z23 Encounter for immunization: Secondary | ICD-10-CM | POA: Diagnosis not present

## 2021-04-04 DIAGNOSIS — E669 Obesity, unspecified: Secondary | ICD-10-CM | POA: Diagnosis not present

## 2021-04-04 DIAGNOSIS — R69 Illness, unspecified: Secondary | ICD-10-CM | POA: Diagnosis not present

## 2021-04-04 NOTE — Assessment & Plan Note (Signed)
Discussed the flu shot and given today.  Also the COVID - 19 booster.

## 2021-04-04 NOTE — Assessment & Plan Note (Signed)
He has lost some weight overall and increasing his exercise.  I encouraged continued efforts.

## 2021-04-04 NOTE — Progress Notes (Signed)
   Subjective:    Patient ID: Vincent Young, male    DOB: 10-09-1965, 55 y.o.   MRN: [id number]  HPI Here for follow up of HIV He has a history of a 184v mutation and has been on Biktarvy alone and doing well.  He though does continue to have some low level viremia and is 82 copies this visit.  His CD4 has remained good at 736.  He has been interested in [place] but with the 184v mutation is unable to.  He does endorse some missed doses after forgetting the medication after work.  He was not aware he should take it even if late.  He has not complaints today.  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  He is asking about flu and COVID vaccines.     Review of Systems  Constitutional:  Negative for fatigue.  Gastrointestinal:  Negative for diarrhea and nausea.  Skin:  Negative for rash.      Objective:   Physical Exam Eyes:     General: No scleral icterus. Pulmonary:     Effort: Pulmonary effort is normal.  Skin:    Findings: No rash.  Neurological:     General: No focal deficit present.     Mental Status: He is alert.  Psychiatric:        Mood and Affect: Mood normal.   SH no tobacco       Assessment & Plan:

## 2021-04-04 NOTE — Progress Notes (Signed)
   Covid-19 Vaccination Clinic  Name:  Orton Capell    MRN: [id number] DOB: Jan 21, 1966  04/04/2021  Mr. Weaver was observed post Covid-19 immunization for 15 minutes without incident. He was provided with Vaccine Information Sheet and instruction to access the V-Safe system.   Mr. Stein was instructed to call 911 with any severe reactions post vaccine: Difficulty breathing  Swelling of face and throat  A fast heartbeat  A bad rash all over body  Dizziness and weakness   Immunizations Administered     Name Date Dose VIS Date Route   Pfizer Covid-19 Vaccine Bivalent Booster 04/04/2021  4:34 PM 0.3 mL 01/17/2021 Intramuscular   Manufacturer: [place]   Lot: [id number]   [place]: [place], [place]

## 2021-04-04 NOTE — Assessment & Plan Note (Signed)
He is doing well on the [place] alone but I did discuss intensification for him/3 active drugs with the persistent viral level.  He though wants to see how he does taking it more faithfully to see if that helps.  He will return in 3 months for a follow up level to see if it is improved or to consider adding Prezcobix.

## 2021-04-05 ENCOUNTER — Encounter: Payer: Medicare HMO | Admitting: Internal Medicine

## 2021-04-11 DIAGNOSIS — R69 Illness, unspecified: Secondary | ICD-10-CM | POA: Diagnosis not present

## 2021-04-11 DIAGNOSIS — E1169 Type 2 diabetes mellitus with other specified complication: Secondary | ICD-10-CM | POA: Diagnosis not present

## 2021-04-11 DIAGNOSIS — Z8739 Personal history of other diseases of the musculoskeletal system and connective tissue: Secondary | ICD-10-CM | POA: Diagnosis not present

## 2021-04-11 DIAGNOSIS — R0789 Other chest pain: Secondary | ICD-10-CM | POA: Diagnosis not present

## 2021-04-11 DIAGNOSIS — Z7984 Long term (current) use of oral hypoglycemic drugs: Secondary | ICD-10-CM | POA: Diagnosis not present

## 2021-04-11 DIAGNOSIS — B009 Herpesviral infection, unspecified: Secondary | ICD-10-CM | POA: Diagnosis not present

## 2021-04-11 DIAGNOSIS — E7800 Pure hypercholesterolemia, unspecified: Secondary | ICD-10-CM | POA: Diagnosis not present

## 2021-04-11 DIAGNOSIS — Z79899 Other long term (current) drug therapy: Secondary | ICD-10-CM | POA: Diagnosis not present

## 2021-04-11 DIAGNOSIS — I10 Essential (primary) hypertension: Secondary | ICD-10-CM | POA: Diagnosis not present

## 2021-04-16 ENCOUNTER — Other Ambulatory Visit (HOSPITAL_COMMUNITY): Payer: Self-pay

## 2021-04-20 ENCOUNTER — Other Ambulatory Visit (HOSPITAL_COMMUNITY): Payer: Self-pay

## 2021-04-21 ENCOUNTER — Other Ambulatory Visit: Payer: Self-pay | Admitting: Cardiovascular Disease

## 2021-04-26 ENCOUNTER — Other Ambulatory Visit (HOSPITAL_COMMUNITY): Payer: Self-pay

## 2021-05-02 DIAGNOSIS — M170 Bilateral primary osteoarthritis of knee: Secondary | ICD-10-CM | POA: Diagnosis not present

## 2021-05-18 DIAGNOSIS — Z20822 Contact with and (suspected) exposure to covid-19: Secondary | ICD-10-CM | POA: Diagnosis not present

## 2021-05-18 DIAGNOSIS — R0789 Other chest pain: Secondary | ICD-10-CM | POA: Diagnosis not present

## 2021-05-21 ENCOUNTER — Other Ambulatory Visit (HOSPITAL_COMMUNITY): Payer: Self-pay

## 2021-05-31 ENCOUNTER — Other Ambulatory Visit (HOSPITAL_COMMUNITY): Payer: Self-pay

## 2021-06-02 ENCOUNTER — Other Ambulatory Visit (HOSPITAL_COMMUNITY): Payer: Self-pay

## 2021-06-05 ENCOUNTER — Other Ambulatory Visit (HOSPITAL_COMMUNITY): Payer: Self-pay

## 2021-06-05 ENCOUNTER — Telehealth: Payer: Self-pay

## 2021-06-05 NOTE — Telephone Encounter (Signed)
[facility] Patient Advocate Encounter   I was successful in securing patient a $ 7500.00 grant from [employer] to provide copayment coverage for Biktarvy.  The patient's out of pocket cost will be 0.00 monthly.     I have spoken with the patient.    The billing information is as follows and has been shared with [facility].       Dates of Eligibility: 06/05/21 through 05/19/22  Patient knows to call the office with questions or concerns.  Ileene Patrick, [facility] Patient [facility] Phone: [phone] Fax:  [phone]

## 2021-06-07 ENCOUNTER — Other Ambulatory Visit (HOSPITAL_COMMUNITY): Payer: Self-pay

## 2021-06-26 ENCOUNTER — Other Ambulatory Visit: Payer: Medicare HMO

## 2021-06-26 ENCOUNTER — Other Ambulatory Visit: Payer: Self-pay

## 2021-06-26 DIAGNOSIS — B20 Human immunodeficiency virus [HIV] disease: Secondary | ICD-10-CM

## 2021-06-27 ENCOUNTER — Other Ambulatory Visit (HOSPITAL_COMMUNITY): Payer: Self-pay

## 2021-06-28 ENCOUNTER — Other Ambulatory Visit: Payer: Self-pay | Admitting: Infectious Diseases

## 2021-06-28 ENCOUNTER — Other Ambulatory Visit (HOSPITAL_COMMUNITY): Payer: Self-pay

## 2021-06-28 DIAGNOSIS — B20 Human immunodeficiency virus [HIV] disease: Secondary | ICD-10-CM

## 2021-06-29 ENCOUNTER — Other Ambulatory Visit: Payer: Self-pay | Admitting: Cardiovascular Disease

## 2021-06-29 ENCOUNTER — Other Ambulatory Visit (HOSPITAL_COMMUNITY): Payer: Self-pay

## 2021-06-29 ENCOUNTER — Other Ambulatory Visit: Payer: Self-pay | Admitting: Infectious Diseases

## 2021-06-29 DIAGNOSIS — B20 Human immunodeficiency virus [HIV] disease: Secondary | ICD-10-CM

## 2021-06-29 MED ORDER — BIKTARVY 50-200-25 MG PO TABS
1.0000 | ORAL_TABLET | Freq: Every day | ORAL | 0 refills | Status: DC
  Filled 2021-06-29: qty 30, 30d supply, fill #0

## 2021-06-29 NOTE — Telephone Encounter (Signed)
Patient has appointment on 2/15

## 2021-07-03 ENCOUNTER — Other Ambulatory Visit (HOSPITAL_COMMUNITY): Payer: Self-pay

## 2021-07-04 ENCOUNTER — Ambulatory Visit: Payer: Medicare HMO | Admitting: Internal Medicine

## 2021-07-13 ENCOUNTER — Other Ambulatory Visit: Payer: Self-pay

## 2021-07-13 ENCOUNTER — Other Ambulatory Visit: Payer: Medicare HMO

## 2021-07-13 DIAGNOSIS — B20 Human immunodeficiency virus [HIV] disease: Secondary | ICD-10-CM | POA: Diagnosis not present

## 2021-07-13 DIAGNOSIS — R69 Illness, unspecified: Secondary | ICD-10-CM | POA: Diagnosis not present

## 2021-07-16 LAB — T-HELPER CELLS (CD4) COUNT (NOT AT ARMC)
Absolute CD4: 1049 cells/uL (ref 490–1740)
CD4 T Helper %: 28 % — ABNORMAL LOW (ref 30–61)
Total lymphocyte count: 3770 cells/uL (ref 850–3900)

## 2021-07-16 LAB — HIV-1 RNA QUANT-NO REFLEX-BLD
HIV 1 RNA Quant: 20 Copies/mL — ABNORMAL HIGH
HIV-1 RNA Quant, Log: 1.31 Log cps/mL — ABNORMAL HIGH

## 2021-07-18 DIAGNOSIS — M25562 Pain in left knee: Secondary | ICD-10-CM | POA: Diagnosis not present

## 2021-07-18 DIAGNOSIS — M170 Bilateral primary osteoarthritis of knee: Secondary | ICD-10-CM | POA: Diagnosis not present

## 2021-07-18 DIAGNOSIS — M25561 Pain in right knee: Secondary | ICD-10-CM | POA: Diagnosis not present

## 2021-07-25 ENCOUNTER — Other Ambulatory Visit (HOSPITAL_COMMUNITY): Payer: Self-pay

## 2021-07-25 ENCOUNTER — Other Ambulatory Visit: Payer: Self-pay | Admitting: Infectious Diseases

## 2021-07-25 DIAGNOSIS — B20 Human immunodeficiency virus [HIV] disease: Secondary | ICD-10-CM

## 2021-07-25 NOTE — Telephone Encounter (Signed)
Appointment on 3/10 ?

## 2021-07-27 ENCOUNTER — Other Ambulatory Visit (HOSPITAL_COMMUNITY): Payer: Self-pay

## 2021-07-27 ENCOUNTER — Other Ambulatory Visit: Payer: Self-pay | Admitting: Cardiovascular Disease

## 2021-07-27 ENCOUNTER — Ambulatory Visit: Payer: Medicare HMO | Admitting: Internal Medicine

## 2021-07-27 ENCOUNTER — Other Ambulatory Visit: Payer: Self-pay | Admitting: Infectious Diseases

## 2021-07-27 ENCOUNTER — Other Ambulatory Visit: Payer: Self-pay

## 2021-07-27 ENCOUNTER — Encounter: Payer: Self-pay | Admitting: Internal Medicine

## 2021-07-27 VITALS — BP 152/88 | HR 97 | Temp 98.7°F | Ht 71.0 in | Wt 378.0 lb

## 2021-07-27 DIAGNOSIS — B20 Human immunodeficiency virus [HIV] disease: Secondary | ICD-10-CM

## 2021-07-27 DIAGNOSIS — I10 Essential (primary) hypertension: Secondary | ICD-10-CM

## 2021-07-27 DIAGNOSIS — R69 Illness, unspecified: Secondary | ICD-10-CM | POA: Diagnosis not present

## 2021-07-27 DIAGNOSIS — E669 Obesity, unspecified: Secondary | ICD-10-CM | POA: Diagnosis not present

## 2021-07-27 DIAGNOSIS — Z79899 Other long term (current) drug therapy: Secondary | ICD-10-CM

## 2021-07-27 DIAGNOSIS — Z113 Encounter for screening for infections with a predominantly sexual mode of transmission: Secondary | ICD-10-CM

## 2021-07-27 MED ORDER — BIKTARVY 50-200-25 MG PO TABS
1.0000 | ORAL_TABLET | Freq: Every day | ORAL | 6 refills | Status: DC
  Filled 2021-07-27: qty 30, 30d supply, fill #0
  Filled 2021-09-07: qty 30, 30d supply, fill #1
  Filled 2021-10-08: qty 30, 30d supply, fill #2
  Filled 2021-11-05: qty 30, 30d supply, fill #3
  Filled 2021-12-17 – 2021-12-24 (×2): qty 30, 30d supply, fill #4
  Filled 2022-01-16: qty 30, 30d supply, fill #5
  Filled 2022-02-12: qty 30, 30d supply, fill #6

## 2021-07-27 NOTE — Assessment & Plan Note (Signed)
Discussed his weight loss journey and encouraged his efforts for better health and a better lifestyle.   ?Will continue to monitor.  ?

## 2021-07-27 NOTE — Assessment & Plan Note (Signed)
His viral load is more suppressed today down to just 20 copies.  In discussion, it mainly seems to be a compliance issue and he understands the need to take it daily.  I discussed with him that with good compliance, he should be good on Biktarvy   ?He is going to change the time he takes it to the am when he gets up for work since he feels that will greatly help him get it in daily.   ?He otherwise will folllow up in 6 months ?[id number] next visit ?

## 2021-07-27 NOTE — Progress Notes (Signed)
? ?  Subjective:  ? ? Patient ID: Vincent Young, male    DOB: [birth year], 56 y.o.   MRN: [id number] ? ?HPI ?Here for follow up of HIV ?He has a history of a 184v mutation and has had persistently low level viremia.  In discussion last visit, he endorsed some missed doses occasionally.  He is motivated to get completely suppressed so we discussed intensification vs better compliance and wanted to see if he could get better suppressed with better complaince and here today to follow up labs.  He has lost some weight intentionally.  His goal is to get down to 300 lbs so he can get knee replacements.  He did not take his blood pressure medicine today.  ? ? ?Review of Systems  ?Constitutional:  Negative for fatigue.  ?Gastrointestinal:  Negative for diarrhea and nausea.  ?Skin:  Negative for rash.  ? ?   ?Objective:  ? Physical Exam ?Eyes:  ?   General: No scleral icterus. ?Pulmonary:  ?   Effort: Pulmonary effort is normal.  ?Neurological:  ?   Mental Status: He is alert.  ? ?SH: no current tobacco ? ? ? ?   ?Assessment & Plan:  ? ? ?

## 2021-07-27 NOTE — Assessment & Plan Note (Signed)
Elevated today and he has been off his BP medication.  I enouraged monitoring and continued efforts at weight reduction.  ?

## 2021-07-30 ENCOUNTER — Other Ambulatory Visit: Payer: Self-pay

## 2021-07-30 ENCOUNTER — Ambulatory Visit: Payer: Medicare HMO | Admitting: Podiatry

## 2021-07-30 DIAGNOSIS — L600 Ingrowing nail: Secondary | ICD-10-CM

## 2021-07-30 NOTE — Patient Instructions (Addendum)
Soak Instructions ? ? ? ?THE DAY AFTER THE PROCEDURE ? ?Place 1/4 cup of epsom salts in a quart of warm tap water.  Submerge your foot or feet with outer bandage intact for the initial soak; this will allow the bandage to become moist and wet for easy lift off.  Once you remove your bandage, continue to soak in the solution for 20 minutes.  This soak should be done twice a day.  Next, remove your foot or feet from solution, blot dry the affected area and cover.  You may use a band aid large enough to cover the area or use gauze and tape.  Apply other medications to the area as directed by the doctor such as polysporin neosporin. ? ?IF YOUR SKIN BECOMES IRRITATED WHILE USING THESE INSTRUCTIONS, IT IS OKAY TO SWITCH TO  WHITE VINEGAR AND WATER. Or you may use antibacterial soap and water to keep the toe clean ? ?Monitor for any signs/symptoms of infection. Call the office immediately if any occur or go directly to the emergency room. Call with any questions/concerns. ?Soak Instructions ? ? ? ? ? ? ? ?[facility] Instructions-Post Nail Surgery ? ?You have had your ingrown toenail and root treated with a chemical.  This chemical causes a burn that will drain and ooze like a blister.  This can drain for 6-8 weeks or longer.  It is important to keep this area clean, covered, and follow the soaking instructions dispensed at the time of your surgery.  This area will eventually dry and form a scab.  Once the scab forms you no longer need to soak or apply a dressing.  If at any time you experience an increase in pain, redness, swelling, or drainage, you should contact the office as soon as possible.  ? ?

## 2021-07-31 ENCOUNTER — Other Ambulatory Visit (HOSPITAL_COMMUNITY): Payer: Self-pay

## 2021-08-01 DIAGNOSIS — M170 Bilateral primary osteoarthritis of knee: Secondary | ICD-10-CM | POA: Diagnosis not present

## 2021-08-01 DIAGNOSIS — M25561 Pain in right knee: Secondary | ICD-10-CM | POA: Diagnosis not present

## 2021-08-01 DIAGNOSIS — M25562 Pain in left knee: Secondary | ICD-10-CM | POA: Diagnosis not present

## 2021-08-05 NOTE — Progress Notes (Signed)
?  Subjective:  ?Patient ID: Vincent Young, male    DOB: 06-13-1965,  MRN: [id number] ? ?Chief Complaint  ?Patient presents with  ? Nail Problem  ?  Patient complain of left hallux and 2nd toenail being discolored, thick, difficult to trim and painful. Patient would like toenails removed if possible.   ? ? ?56 y.o. male presents with the above complaint. History confirmed with patient.  Returns today with ingrowing nail on his great toe and second toenail would like the nail moved ? ?Objective:  ?Physical Exam: ?warm, good capillary refill, no trophic changes or ulcerative lesions, normal DP and PT pulses, and abnormal monofilament exam with loss of protective sensation.  Ingrown dystrophic toenails second and left great toe left foot no paronychia noted ?Assessment:  ? ?1. Ingrown nail   ? ? ? ?Plan:  ?Patient was evaluated and treated and all questions answered. ? ? ? ?Ingrown Nail, left ?-Patient elects to proceed with minor surgery to remove ingrown toenail today. Consent reviewed and signed by patient. ?-Ingrown nail excised. See procedure note. ?-Educated on post-procedure care including soaking. Written instructions provided and reviewed. ? ? ? ?Procedure: Excision of Ingrown Toenail ?Location: Left 1st toe and 2nd toe  nail . ?Anesthesia: Lidocaine 1% plain; 1.5 mL and Marcaine 0.5% plain; 1.5 mL, digital block. ?Skin Prep: Betadine. ?Dressing: Silvadene; telfa; dry, sterile, compression dressing. ?Technique: Following skin prep, the toe was exsanguinated and a tourniquet was secured at the base of the toe. The affected nail was freed,  and excised. Chemical matrixectomy was then performed with phenol and irrigated out with alcohol. The tourniquet was then removed and sterile dressing applied. ?Disposition: Patient tolerated procedure well.  ? ? ?Return if symptoms worsen or fail to improve.  ? ? ?

## 2021-08-16 ENCOUNTER — Other Ambulatory Visit: Payer: Self-pay | Admitting: Cardiovascular Disease

## 2021-08-20 DIAGNOSIS — Z1389 Encounter for screening for other disorder: Secondary | ICD-10-CM | POA: Diagnosis not present

## 2021-08-20 DIAGNOSIS — Z0000 Encounter for general adult medical examination without abnormal findings: Secondary | ICD-10-CM | POA: Diagnosis not present

## 2021-08-21 ENCOUNTER — Other Ambulatory Visit (HOSPITAL_COMMUNITY): Payer: Self-pay

## 2021-08-23 ENCOUNTER — Other Ambulatory Visit (HOSPITAL_COMMUNITY): Payer: Self-pay

## 2021-08-27 ENCOUNTER — Other Ambulatory Visit (HOSPITAL_COMMUNITY): Payer: Self-pay

## 2021-08-27 DIAGNOSIS — J4541 Moderate persistent asthma with (acute) exacerbation: Secondary | ICD-10-CM | POA: Diagnosis not present

## 2021-08-27 DIAGNOSIS — Z5181 Encounter for therapeutic drug level monitoring: Secondary | ICD-10-CM | POA: Diagnosis not present

## 2021-08-27 DIAGNOSIS — E1169 Type 2 diabetes mellitus with other specified complication: Secondary | ICD-10-CM | POA: Diagnosis not present

## 2021-08-27 DIAGNOSIS — M170 Bilateral primary osteoarthritis of knee: Secondary | ICD-10-CM | POA: Diagnosis not present

## 2021-08-27 DIAGNOSIS — G8929 Other chronic pain: Secondary | ICD-10-CM | POA: Diagnosis not present

## 2021-08-27 DIAGNOSIS — I2510 Atherosclerotic heart disease of native coronary artery without angina pectoris: Secondary | ICD-10-CM | POA: Diagnosis not present

## 2021-08-27 DIAGNOSIS — K219 Gastro-esophageal reflux disease without esophagitis: Secondary | ICD-10-CM | POA: Diagnosis not present

## 2021-08-27 DIAGNOSIS — I10 Essential (primary) hypertension: Secondary | ICD-10-CM | POA: Diagnosis not present

## 2021-08-27 DIAGNOSIS — E291 Testicular hypofunction: Secondary | ICD-10-CM | POA: Diagnosis not present

## 2021-08-27 DIAGNOSIS — E782 Mixed hyperlipidemia: Secondary | ICD-10-CM | POA: Diagnosis not present

## 2021-08-28 DIAGNOSIS — E782 Mixed hyperlipidemia: Secondary | ICD-10-CM | POA: Diagnosis not present

## 2021-08-28 DIAGNOSIS — E291 Testicular hypofunction: Secondary | ICD-10-CM | POA: Diagnosis not present

## 2021-08-28 DIAGNOSIS — Z5181 Encounter for therapeutic drug level monitoring: Secondary | ICD-10-CM | POA: Diagnosis not present

## 2021-08-28 DIAGNOSIS — E1169 Type 2 diabetes mellitus with other specified complication: Secondary | ICD-10-CM | POA: Diagnosis not present

## 2021-09-07 ENCOUNTER — Other Ambulatory Visit (HOSPITAL_COMMUNITY): Payer: Self-pay

## 2021-09-10 DIAGNOSIS — E291 Testicular hypofunction: Secondary | ICD-10-CM | POA: Diagnosis not present

## 2021-09-25 DIAGNOSIS — M2392 Unspecified internal derangement of left knee: Secondary | ICD-10-CM | POA: Diagnosis not present

## 2021-09-25 DIAGNOSIS — M25562 Pain in left knee: Secondary | ICD-10-CM | POA: Diagnosis not present

## 2021-09-25 DIAGNOSIS — M25561 Pain in right knee: Secondary | ICD-10-CM | POA: Diagnosis not present

## 2021-09-25 DIAGNOSIS — M2391 Unspecified internal derangement of right knee: Secondary | ICD-10-CM | POA: Diagnosis not present

## 2021-10-04 ENCOUNTER — Other Ambulatory Visit (HOSPITAL_COMMUNITY): Payer: Self-pay

## 2021-10-08 ENCOUNTER — Other Ambulatory Visit (HOSPITAL_COMMUNITY): Payer: Self-pay

## 2021-10-10 DIAGNOSIS — E1169 Type 2 diabetes mellitus with other specified complication: Secondary | ICD-10-CM | POA: Diagnosis not present

## 2021-10-10 DIAGNOSIS — I10 Essential (primary) hypertension: Secondary | ICD-10-CM | POA: Diagnosis not present

## 2021-10-10 DIAGNOSIS — J4541 Moderate persistent asthma with (acute) exacerbation: Secondary | ICD-10-CM | POA: Diagnosis not present

## 2021-10-10 DIAGNOSIS — E782 Mixed hyperlipidemia: Secondary | ICD-10-CM | POA: Diagnosis not present

## 2021-10-10 DIAGNOSIS — G8929 Other chronic pain: Secondary | ICD-10-CM | POA: Diagnosis not present

## 2021-10-18 ENCOUNTER — Other Ambulatory Visit (HOSPITAL_COMMUNITY): Payer: Self-pay

## 2021-10-31 NOTE — Progress Notes (Signed)
Cardiology Office Note:   Date:  11/02/2021  NAME:  Vincent Young    MRN: [id number] DOB:  1965/08/05   PCP:  Vincent Shanks, MD  Cardiologist:  None  Electrophysiologist:  None   Referring MD: Vincent Shanks, MD   Chief Complaint  Patient presents with   Follow-up   History of Present Illness:   Vincent Young is a 56 y.o. male with a hx of nonobstructive CAD, hypertension, obesity, diabetes, OSA who presents for follow-up.  He presents for routine follow-up.  Denies any chest pain or trouble breathing.  Reports some trace edema in his legs but nothing worrisome.  Most recent LDL cholesterol 70 which is at goal.  This is not interfering with his HIV medication.  Diagnosed with severe sleep apnea but has had issues getting the CPAP machine.  We have reached out to sleep medicine.  He overall is doing well without any complaints today.  Problem List 1. DM -A1c 6.6 2. HLD -Total cholesterol 114, HDL 41, LDL 60, triglycerides 65 3. HTN 4. HIV 5. CAD -CAC score 195 (94th percentile) -50% D1 and mid LAD 6. Severe OSA  Past Medical History: Past Medical History:  Diagnosis Date   Acute renal failure (HCC)    due to dehydration   Arthritis    Asthma    Coronary artery disease    Diabetes mellitus without complication (HCC)    DJD (degenerative joint disease)    Exposure to TB    Family history of adverse reaction to anesthesia    " My brother has had PONV"   GERD (gastroesophageal reflux disease)    Headache    HIV infection ([place])    Hypertension    Obesity    OSA (obstructive sleep apnea) 07/12/2020   Pilonidal cyst    Recurrent genital HSV (herpes simplex virus) infection    Sleep apnea    never tested-has s/s    Past Surgical History: Past Surgical History:  Procedure Laterality Date   APPENDECTOMY     CHONDROPLASTY  12/16/2011   Procedure: CHONDROPLASTY;  Surgeon: Vincent Salen, MD;  Location: [facility];  Service: Orthopedics;  Laterality:  Right;  Right knee medial and lateral menisectomy and Debridement 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,Trochlea    COLONOSCOPY W/ BIOPSIES AND POLYPECTOMY     COLONOSCOPY WITH PROPOFOL N/A 08/29/2014   Procedure: COLONOSCOPY WITH PROPOFOL;  Surgeon: Vincent Essex, MD;  [facility] END[facility]COPY;  Service: Endoscopy;  Laterality: N/A;   COLONOSCOPY WITH PROPOFOL N/A 11/16/2019   Procedure: COLONOSCOPY WITH PROPOFOL;  Surgeon: Vincent Essex, MD;  Location: [facility] ENDOSCOPY;  Service: Endoscopy;  Laterality: N/A;   HERNIA REPAIR     inguinal as a child   LEFT HEART CATH AND CORONARY ANGIOGRAPHY N/A 03/15/2020   Procedure: LEFT HEART CATH AND CORONARY ANGIOGRAPHY;  Surgeon: Leonie Man, MD;  Location: [facility];  Service: Cardiovascular;  Laterality: N/A;   PEG TUBE PLACEMENT  2009   POLYPECTOMY  11/16/2019   Procedure: POLYPECTOMY;  Surgeon: Vincent Essex, MD;  Location: [facility] ENDOSCOPY;  Service: Endoscopy;;   TONSILLECTOMY     UVULECTOMY  2009    Current Medications: No outpatient medications have been marked as taking for the 11/02/21 encounter (Office Visit) with Vincent Rile, MD.   Current Facility-Administered Medications for the 11/02/21 encounter (Office Visit) with Vincent Rile, MD  Medication   sodium chloride flush (NS) 0.9 % injection 3  mL     Allergies:    Black walnut pollen allergy skin test, Shellfish allergy, Other, and Penicillins   Social History: Social History   Socioeconomic History   Marital status: Legally Separated    Spouse name: Not on file   Number of children: 6   Years of education: Not on file   Highest education level: Not on file  Occupational History   Occupation: [employer]   Tobacco Use   Smoking status: Former    Years: 30.00    Types: Cigarettes    Quit date: 12/04/2003    Years since quitting: 17.9   Smokeless tobacco: Never   Tobacco comments:    pt. no longer smokes  Vaping Use   Vaping  Use: Never used  Substance and Sexual Activity   Alcohol use: Not Currently    Comment: occasional   Drug use: Not Currently   Sexual activity: Yes    Partners: Female    Comment: pt. declined condoms  Other Topics Concern   Not on file  Social History Narrative   Not on file   Social Determinants of Health   Financial Resource Strain: Not on file  Food Insecurity: Not on file  Transportation Needs: Not on file  Physical Activity: Not on file  Stress: Not on file  Social Connections: Not on file     Family History: The patient's family history includes Arthritis in his mother and sister; Heart disease in his father; Hypertension in his brother, father, mother, and sister.  ROS:   All other ROS reviewed and negative. Pertinent positives noted in the HPI.     EKGs/Labs/Other Studies Reviewed:   The following studies were personally reviewed by me today:   LHC 46/56/8127 LV end diastolic pressure is mildly elevated. --------------------- 1st Diag lesion is 45% stenosed. Dist-Apical LAD lesion is 50% stenosed. Ramus lesion is 55% stenosed.   SUMMARY Angiographically minimal disease, apical LAD tapers to roughly 50%, D1 has roughly 45% eccentric lesion. Normal LVEDP  Recent Labs: No results found for requested labs within last 365 days.   Recent Lipid Panel    Component Value Date/Time   CHOL 133 09/08/2019 0905   TRIG 69 09/08/2019 0905   HDL 48 09/08/2019 0905   CHOLHDL 2.8 09/08/2019 0905   VLDL 13 12/28/2015 1135   LDLCALC 70 09/08/2019 0905    Physical Exam:   VS:  BP (!) 152/74   Pulse 91   Ht '5\' 11"'$  (1.803 m)   Wt (!) 393 lb (178.3 kg)   SpO2 97%   BMI 54.81 kg/m    Wt Readings from Last 3 Encounters:  11/02/21 (!) 393 lb (178.3 kg)  07/27/21 (!) 378 lb (171.5 kg)  04/04/21 (!) 380 lb 12.8 oz (172.7 kg)    General: Well nourished, well developed, in no acute distress Head: Atraumatic, normal size  Eyes: PEERLA, EOMI  Neck: Supple, no  JVD Endocrine: No thryomegaly Cardiac: Normal S1, S2; RRR; no murmurs, rubs, or gallops Lungs: Clear to auscultation bilaterally, no wheezing, rhonchi or rales  Abd: Soft, nontender, no hepatomegaly  Ext: No edema, pulses 2+ Musculoskeletal: No deformities, BUE and BLE strength normal and equal Skin: Warm and dry, no rashes   Neuro: Alert and oriented to person, place, time, and situation, CNII-XII grossly intact, no focal deficits  Psych: Normal mood and affect   ASSESSMENT:   Rainen Vanrossum is a 56 y.o. male who presents for the following: 1. Coronary artery disease involving native  coronary artery of native heart without angina pectoris   2. Mixed hyperlipidemia   3. OSA (obstructive sleep apnea)     PLAN:   1. Coronary artery disease involving native coronary artery of native heart without angina pectoris 2. Mixed hyperlipidemia -Elevated coronary calcium score.  Concern for severe disease on coronary CTA.  Coronary calcium score 195 which is 94th percentile.  Left heart catheterization with 50% mid LAD lesion.  Not flow-limiting.  We have continue with aggressive secondary prevention.  Including aspirin 81 mg daily as well as Lipitor 80 mg daily.  Most recent LDL cholesterol 70.  We will continue this.  I recommended regular exercise as well as proper diet.  Also need to get his sleep apnea treated.  Apparently he did not receive his CPAP machine.  We will work with his supplier to get this to him.  All of his other risk factors appear to be well controlled.  No change in other medications.  3. OSA -Due to the COVID-19 pandemic the patient never received his BiPAP machine.  We have reach back out to sleep medicine to see if they can facilitate expediting this.  He has severe sleep apnea.  Likely contributing to high blood pressure.  Disposition: Return in about 1 year (around 11/03/2022).  Medication Adjustments/Labs and Tests Ordered: Current medicines are reviewed at length with  the patient today.  Concerns regarding medicines are outlined above.  No orders of the defined types were placed in this encounter.  No orders of the defined types were placed in this encounter.   Patient Instructions  Medication Instructions:  The current medical regimen is effective;  continue present plan and medications.  *If you need a refill on your cardiac medications before your next appointment, please call your pharmacy*   Follow-Up: At [facility], you and your health needs are our priority.  As part of our continuing mission to provide you with exceptional heart care, we have created designated Provider Care Teams.  These Care Teams include your primary Cardiologist (physician) and Advanced Practice Providers (APPs -  Physician Assistants and Nurse Practitioners) who all work together to provide you with the care you need, when you need it.  We recommend signing up for the patient portal called "MyChart".  Sign up information is provided on this After Visit Summary.  MyChart is used to connect with patients for Virtual Visits (Telemedicine).  Patients are able to view lab/test results, encounter notes, upcoming appointments, etc.  Non-urgent messages can be sent to your provider as well.   To learn more about what you can do with MyChart, go to [email or web address].    Your next appointment:   12 month(s)  The format for your next appointment:   In Person  Provider:   Eleonore Chiquito, MD              Time Spent with Patient: I have spent a total of 25 minutes with patient reviewing hospital notes, telemetry, EKGs, labs and examining the patient as well as establishing an assessment and plan that was discussed with the patient.  > 50% of time was spent in direct patient care.  Signed, Addison Naegeli. Audie Box, MD, [place]  [address] [place], [place] [address] [phone]  11/02/2021 2:23 PM

## 2021-11-01 ENCOUNTER — Other Ambulatory Visit (HOSPITAL_COMMUNITY): Payer: Self-pay

## 2021-11-02 ENCOUNTER — Encounter: Payer: Self-pay | Admitting: Cardiovascular Disease

## 2021-11-02 ENCOUNTER — Ambulatory Visit: Payer: Medicare HMO | Admitting: Cardiovascular Disease

## 2021-11-02 VITALS — BP 152/74 | HR 91 | Ht 71.0 in | Wt 393.0 lb

## 2021-11-02 DIAGNOSIS — G4733 Obstructive sleep apnea (adult) (pediatric): Secondary | ICD-10-CM

## 2021-11-02 DIAGNOSIS — I2510 Atherosclerotic heart disease of native coronary artery without angina pectoris: Secondary | ICD-10-CM | POA: Diagnosis not present

## 2021-11-02 DIAGNOSIS — E782 Mixed hyperlipidemia: Secondary | ICD-10-CM

## 2021-11-02 NOTE — Patient Instructions (Signed)
Medication Instructions:  The current medical regimen is effective;  continue present plan and medications.  *If you need a refill on your cardiac medications before your next appointment, please call your pharmacy*   Follow-Up: At [facility], you and your health needs are our priority.  As part of our continuing mission to provide you with exceptional heart care, we have created designated Provider Care Teams.  These Care Teams include your primary Cardiologist (physician) and Advanced Practice Providers (APPs -  Physician Assistants and Nurse Practitioners) who all work together to provide you with the care you need, when you need it.  We recommend signing up for the patient portal called "MyChart".  Sign up information is provided on this After Visit Summary.  MyChart is used to connect with patients for Virtual Visits (Telemedicine).  Patients are able to view lab/test results, encounter notes, upcoming appointments, etc.  Non-urgent messages can be sent to your provider as well.   To learn more about what you can do with MyChart, go to [email or web address].    Your next appointment:   12 month(s)  The format for your next appointment:   In Person  Provider:   [place] O'Neal, MD     

## 2021-11-05 ENCOUNTER — Other Ambulatory Visit (HOSPITAL_COMMUNITY): Payer: Self-pay

## 2021-11-12 ENCOUNTER — Other Ambulatory Visit (HOSPITAL_COMMUNITY): Payer: Self-pay

## 2021-11-13 ENCOUNTER — Encounter (HOSPITAL_COMMUNITY): Payer: Self-pay | Admitting: Pharmacist

## 2021-11-13 ENCOUNTER — Other Ambulatory Visit (HOSPITAL_COMMUNITY): Payer: Self-pay

## 2021-11-15 ENCOUNTER — Other Ambulatory Visit (HOSPITAL_COMMUNITY): Payer: Self-pay

## 2021-11-21 ENCOUNTER — Other Ambulatory Visit (HOSPITAL_COMMUNITY): Payer: Self-pay

## 2021-12-11 ENCOUNTER — Other Ambulatory Visit (HOSPITAL_COMMUNITY): Payer: Self-pay

## 2021-12-13 ENCOUNTER — Other Ambulatory Visit (HOSPITAL_COMMUNITY): Payer: Self-pay

## 2021-12-17 ENCOUNTER — Other Ambulatory Visit (HOSPITAL_COMMUNITY): Payer: Self-pay

## 2021-12-22 ENCOUNTER — Other Ambulatory Visit (HOSPITAL_COMMUNITY): Payer: Self-pay

## 2021-12-24 ENCOUNTER — Other Ambulatory Visit (HOSPITAL_COMMUNITY): Payer: Self-pay

## 2021-12-24 ENCOUNTER — Telehealth: Payer: Self-pay

## 2021-12-24 DIAGNOSIS — M9903 Segmental and somatic dysfunction of lumbar region: Secondary | ICD-10-CM | POA: Diagnosis not present

## 2021-12-24 DIAGNOSIS — M62838 Other muscle spasm: Secondary | ICD-10-CM | POA: Diagnosis not present

## 2021-12-24 DIAGNOSIS — M9905 Segmental and somatic dysfunction of pelvic region: Secondary | ICD-10-CM | POA: Diagnosis not present

## 2021-12-24 DIAGNOSIS — M5136 Other intervertebral disc degeneration, lumbar region: Secondary | ICD-10-CM | POA: Diagnosis not present

## 2021-12-24 DIAGNOSIS — M532X7 Spinal instabilities, lumbosacral region: Secondary | ICD-10-CM | POA: Diagnosis not present

## 2021-12-24 NOTE — Telephone Encounter (Addendum)
[facility] Patient Advocate Encounter   I was successful in securing patient a $ 10,000.00 grant from [employer] to provide copayment coverage for Biktarvy.  The patient's out of pocket cost will be $0.00 monthly.     I have spoken with the patient.    The billing information is as follows and has been shared with [facility].    I spoke with [employer] on 01/22/2022 they gave me a new ID # [id number]    Dates of Eligibility: 12/24/21 through 05/19/22  Patient knows to call the office with questions or concerns.  Ileene Patrick, [facility] Patient [facility] Phone: [phone] Fax:  [phone]

## 2021-12-26 DIAGNOSIS — M9903 Segmental and somatic dysfunction of lumbar region: Secondary | ICD-10-CM | POA: Diagnosis not present

## 2021-12-26 DIAGNOSIS — M9905 Segmental and somatic dysfunction of pelvic region: Secondary | ICD-10-CM | POA: Diagnosis not present

## 2021-12-26 DIAGNOSIS — M532X7 Spinal instabilities, lumbosacral region: Secondary | ICD-10-CM | POA: Diagnosis not present

## 2021-12-26 DIAGNOSIS — M5136 Other intervertebral disc degeneration, lumbar region: Secondary | ICD-10-CM | POA: Diagnosis not present

## 2021-12-26 DIAGNOSIS — M62838 Other muscle spasm: Secondary | ICD-10-CM | POA: Diagnosis not present

## 2021-12-31 DIAGNOSIS — M532X7 Spinal instabilities, lumbosacral region: Secondary | ICD-10-CM | POA: Diagnosis not present

## 2021-12-31 DIAGNOSIS — M62838 Other muscle spasm: Secondary | ICD-10-CM | POA: Diagnosis not present

## 2021-12-31 DIAGNOSIS — M5136 Other intervertebral disc degeneration, lumbar region: Secondary | ICD-10-CM | POA: Diagnosis not present

## 2021-12-31 DIAGNOSIS — M9903 Segmental and somatic dysfunction of lumbar region: Secondary | ICD-10-CM | POA: Diagnosis not present

## 2021-12-31 DIAGNOSIS — M9905 Segmental and somatic dysfunction of pelvic region: Secondary | ICD-10-CM | POA: Diagnosis not present

## 2022-01-07 DIAGNOSIS — M532X7 Spinal instabilities, lumbosacral region: Secondary | ICD-10-CM | POA: Diagnosis not present

## 2022-01-07 DIAGNOSIS — M9903 Segmental and somatic dysfunction of lumbar region: Secondary | ICD-10-CM | POA: Diagnosis not present

## 2022-01-07 DIAGNOSIS — M5136 Other intervertebral disc degeneration, lumbar region: Secondary | ICD-10-CM | POA: Diagnosis not present

## 2022-01-07 DIAGNOSIS — M9905 Segmental and somatic dysfunction of pelvic region: Secondary | ICD-10-CM | POA: Diagnosis not present

## 2022-01-07 DIAGNOSIS — M62838 Other muscle spasm: Secondary | ICD-10-CM | POA: Diagnosis not present

## 2022-01-14 DIAGNOSIS — M9903 Segmental and somatic dysfunction of lumbar region: Secondary | ICD-10-CM | POA: Diagnosis not present

## 2022-01-14 DIAGNOSIS — M532X7 Spinal instabilities, lumbosacral region: Secondary | ICD-10-CM | POA: Diagnosis not present

## 2022-01-14 DIAGNOSIS — M9905 Segmental and somatic dysfunction of pelvic region: Secondary | ICD-10-CM | POA: Diagnosis not present

## 2022-01-14 DIAGNOSIS — M5136 Other intervertebral disc degeneration, lumbar region: Secondary | ICD-10-CM | POA: Diagnosis not present

## 2022-01-16 ENCOUNTER — Other Ambulatory Visit: Payer: Medicare HMO

## 2022-01-16 ENCOUNTER — Other Ambulatory Visit (HOSPITAL_COMMUNITY)
Admission: RE | Admit: 2022-01-16 | Discharge: 2022-01-16 | Disposition: A | Discharge status: Home / Self Care | Payer: Medicare HMO | Source: Ambulatory Visit | Attending: Internal Medicine | Admitting: Internal Medicine

## 2022-01-16 ENCOUNTER — Other Ambulatory Visit (HOSPITAL_COMMUNITY): Payer: Self-pay

## 2022-01-16 ENCOUNTER — Other Ambulatory Visit: Payer: Self-pay

## 2022-01-16 DIAGNOSIS — B20 Human immunodeficiency virus [HIV] disease: Secondary | ICD-10-CM

## 2022-01-16 DIAGNOSIS — M9905 Segmental and somatic dysfunction of pelvic region: Secondary | ICD-10-CM | POA: Diagnosis not present

## 2022-01-16 DIAGNOSIS — M5136 Other intervertebral disc degeneration, lumbar region: Secondary | ICD-10-CM | POA: Diagnosis not present

## 2022-01-16 DIAGNOSIS — Z113 Encounter for screening for infections with a predominantly sexual mode of transmission: Secondary | ICD-10-CM | POA: Diagnosis not present

## 2022-01-16 DIAGNOSIS — Z79899 Other long term (current) drug therapy: Secondary | ICD-10-CM | POA: Diagnosis not present

## 2022-01-16 DIAGNOSIS — M9903 Segmental and somatic dysfunction of lumbar region: Secondary | ICD-10-CM | POA: Diagnosis not present

## 2022-01-16 DIAGNOSIS — M532X7 Spinal instabilities, lumbosacral region: Secondary | ICD-10-CM | POA: Diagnosis not present

## 2022-01-16 DIAGNOSIS — R69 Illness, unspecified: Secondary | ICD-10-CM | POA: Diagnosis not present

## 2022-01-16 NOTE — Addendum Note (Signed)
Addended by: Caffie Pinto on: 01/16/2022 11:40 AM   Modules accepted: Orders

## 2022-01-17 LAB — T-HELPER CELLS (CD4) COUNT (NOT AT ARMC)
CD4 % Helper T Cell: 24 % — ABNORMAL LOW (ref 33–65)
CD4 T Cell Abs: 738 /uL (ref 400–1790)

## 2022-01-17 LAB — URINE CYTOLOGY ANCILLARY ONLY
Chlamydia: NEGATIVE
Comment: NEGATIVE
Comment: NORMAL
Neisseria Gonorrhea: NEGATIVE

## 2022-01-19 LAB — CBC WITH DIFFERENTIAL/PLATELET
Absolute Monocytes: 680 cells/uL (ref 200–950)
Basophils Absolute: 8 cells/uL (ref 0–200)
Basophils Relative: 0.1 %
Eosinophils Absolute: 232 cells/uL (ref 15–500)
Eosinophils Relative: 2.9 %
HCT: 35.8 % — ABNORMAL LOW (ref 38.5–50.0)
Hemoglobin: 12.3 g/dL — ABNORMAL LOW (ref 13.2–17.1)
Lymphs Abs: 3160 cells/uL (ref 850–3900)
MCH: 31.9 pg (ref 27.0–33.0)
MCHC: 34.4 g/dL (ref 32.0–36.0)
MCV: 93 fL (ref 80.0–100.0)
MPV: 9.2 fL (ref 7.5–12.5)
Monocytes Relative: 8.5 %
Neutro Abs: 3920 cells/uL (ref 1500–7800)
Neutrophils Relative %: 49 %
Platelets: 272 10*3/uL (ref 140–400)
RBC: 3.85 10*6/uL — ABNORMAL LOW (ref 4.20–5.80)
RDW: 13 % (ref 11.0–15.0)
Total Lymphocyte: 39.5 %
WBC: 8 10*3/uL (ref 3.8–10.8)

## 2022-01-19 LAB — HIV-1 RNA QUANT-NO REFLEX-BLD
HIV 1 RNA Quant: NOT DETECTED Copies/mL
HIV-1 RNA Quant, Log: NOT DETECTED Log cps/mL

## 2022-01-19 LAB — LIPID PANEL
Cholesterol: 112 mg/dL (ref <200)
HDL: 45 mg/dL (ref >=40)
LDL Cholesterol (Calc): 53 mg/dL (calc)
Non-HDL Cholesterol (Calc): 67 mg/dL (calc) (ref <130)
Total CHOL/HDL Ratio: 2.5 (calc) (ref <5.0)
Triglycerides: 67 mg/dL (ref <150)

## 2022-01-19 LAB — COMPLETE METABOLIC PANEL WITH GFR
AG Ratio: 1.2 (calc) (ref 1.0–2.5)
ALT: 17 U/L (ref 9–46)
AST: 16 U/L (ref 10–35)
Albumin: 3.7 g/dL (ref 3.6–5.1)
Alkaline phosphatase (APISO): 114 U/L (ref 35–144)
BUN: 15 mg/dL (ref 7–25)
CO2: 26 mmol/L (ref 20–32)
Calcium: 9 mg/dL (ref 8.6–10.3)
Chloride: 108 mmol/L (ref 98–110)
Creat: 1.24 mg/dL (ref 0.70–1.30)
Globulin: 3 g/dL (calc) (ref 1.9–3.7)
Glucose, Bld: 121 mg/dL — ABNORMAL HIGH (ref 65–99)
Potassium: 3.9 mmol/L (ref 3.5–5.3)
Sodium: 142 mmol/L (ref 135–146)
Total Bilirubin: 0.3 mg/dL (ref 0.2–1.2)
Total Protein: 6.7 g/dL (ref 6.1–8.1)
eGFR: 67 mL/min/{1.73_m2} (ref >=60)

## 2022-01-19 LAB — RPR: RPR Ser Ql: NONREACTIVE

## 2022-01-22 ENCOUNTER — Telehealth: Payer: Self-pay

## 2022-01-22 ENCOUNTER — Other Ambulatory Visit (HOSPITAL_COMMUNITY): Payer: Self-pay

## 2022-01-22 NOTE — Telephone Encounter (Signed)
Patient called office today for copy of lab done on 8/30. Would like copy left in triage. Will leave copy in accordion folder. Will come to office later this week. Leatrice Jewels, RMA

## 2022-01-23 ENCOUNTER — Other Ambulatory Visit (HOSPITAL_COMMUNITY): Payer: Self-pay

## 2022-01-23 DIAGNOSIS — M9905 Segmental and somatic dysfunction of pelvic region: Secondary | ICD-10-CM | POA: Diagnosis not present

## 2022-01-23 DIAGNOSIS — M532X7 Spinal instabilities, lumbosacral region: Secondary | ICD-10-CM | POA: Diagnosis not present

## 2022-01-23 DIAGNOSIS — M5136 Other intervertebral disc degeneration, lumbar region: Secondary | ICD-10-CM | POA: Diagnosis not present

## 2022-01-23 DIAGNOSIS — M9903 Segmental and somatic dysfunction of lumbar region: Secondary | ICD-10-CM | POA: Diagnosis not present

## 2022-01-24 DIAGNOSIS — M532X7 Spinal instabilities, lumbosacral region: Secondary | ICD-10-CM | POA: Diagnosis not present

## 2022-01-24 DIAGNOSIS — M9903 Segmental and somatic dysfunction of lumbar region: Secondary | ICD-10-CM | POA: Diagnosis not present

## 2022-01-24 DIAGNOSIS — M5136 Other intervertebral disc degeneration, lumbar region: Secondary | ICD-10-CM | POA: Diagnosis not present

## 2022-01-24 DIAGNOSIS — M9905 Segmental and somatic dysfunction of pelvic region: Secondary | ICD-10-CM | POA: Diagnosis not present

## 2022-01-28 DIAGNOSIS — M532X7 Spinal instabilities, lumbosacral region: Secondary | ICD-10-CM | POA: Diagnosis not present

## 2022-01-28 DIAGNOSIS — M5136 Other intervertebral disc degeneration, lumbar region: Secondary | ICD-10-CM | POA: Diagnosis not present

## 2022-01-28 DIAGNOSIS — M9903 Segmental and somatic dysfunction of lumbar region: Secondary | ICD-10-CM | POA: Diagnosis not present

## 2022-01-28 DIAGNOSIS — M9905 Segmental and somatic dysfunction of pelvic region: Secondary | ICD-10-CM | POA: Diagnosis not present

## 2022-01-30 ENCOUNTER — Ambulatory Visit: Payer: Medicare HMO | Admitting: Internal Medicine

## 2022-01-30 DIAGNOSIS — M9903 Segmental and somatic dysfunction of lumbar region: Secondary | ICD-10-CM | POA: Diagnosis not present

## 2022-01-30 DIAGNOSIS — M532X7 Spinal instabilities, lumbosacral region: Secondary | ICD-10-CM | POA: Diagnosis not present

## 2022-01-30 DIAGNOSIS — M9905 Segmental and somatic dysfunction of pelvic region: Secondary | ICD-10-CM | POA: Diagnosis not present

## 2022-01-30 DIAGNOSIS — M5136 Other intervertebral disc degeneration, lumbar region: Secondary | ICD-10-CM | POA: Diagnosis not present

## 2022-02-07 DIAGNOSIS — J209 Acute bronchitis, unspecified: Secondary | ICD-10-CM | POA: Diagnosis not present

## 2022-02-11 DIAGNOSIS — M9903 Segmental and somatic dysfunction of lumbar region: Secondary | ICD-10-CM | POA: Diagnosis not present

## 2022-02-11 DIAGNOSIS — M9905 Segmental and somatic dysfunction of pelvic region: Secondary | ICD-10-CM | POA: Diagnosis not present

## 2022-02-11 DIAGNOSIS — M5136 Other intervertebral disc degeneration, lumbar region: Secondary | ICD-10-CM | POA: Diagnosis not present

## 2022-02-11 DIAGNOSIS — M532X7 Spinal instabilities, lumbosacral region: Secondary | ICD-10-CM | POA: Diagnosis not present

## 2022-02-12 ENCOUNTER — Other Ambulatory Visit: Payer: Self-pay | Admitting: Cardiovascular Disease

## 2022-02-12 ENCOUNTER — Other Ambulatory Visit (HOSPITAL_COMMUNITY): Payer: Self-pay

## 2022-02-12 ENCOUNTER — Encounter: Payer: Self-pay | Admitting: Internal Medicine

## 2022-02-12 ENCOUNTER — Other Ambulatory Visit: Payer: Self-pay

## 2022-02-12 ENCOUNTER — Ambulatory Visit (INDEPENDENT_AMBULATORY_CARE_PROVIDER_SITE_OTHER): Payer: Medicare HMO | Admitting: Internal Medicine

## 2022-02-12 VITALS — BP 181/107 | HR 97 | Temp 97.9°F | Wt 385.0 lb

## 2022-02-12 DIAGNOSIS — Z5181 Encounter for therapeutic drug level monitoring: Secondary | ICD-10-CM | POA: Diagnosis not present

## 2022-02-12 DIAGNOSIS — R69 Illness, unspecified: Secondary | ICD-10-CM | POA: Diagnosis not present

## 2022-02-12 DIAGNOSIS — Z23 Encounter for immunization: Secondary | ICD-10-CM

## 2022-02-12 DIAGNOSIS — E669 Obesity, unspecified: Secondary | ICD-10-CM

## 2022-02-12 DIAGNOSIS — B20 Human immunodeficiency virus [HIV] disease: Secondary | ICD-10-CM

## 2022-02-12 NOTE — Assessment & Plan Note (Signed)
Flu shot given today

## 2022-02-12 NOTE — Assessment & Plan Note (Signed)
He is getting physical therapy for his knees and feeling better.  He is hopeful to exercise more and continue his weight loss efforts.  Also getting help with the PT clinic.

## 2022-02-12 NOTE — Progress Notes (Signed)
   Subjective:    Patient ID: Vincent Young, male    DOB: 16-May-1966, 56 y.o.   MRN: [id number]  HPI Here for follow up of HIV He continues on Biktarvy and now has been having very good compliance.  In our previous conversations about his detectable virus, he endorsed some missed doses.  Now with good compliance, his viral load is not detected.  He is pleased with the result.  His 6 year old son is still adjusting to the now official divorce.  He continues to work at [employer] and enjoys his work with the people coming for help.     Review of Systems  Constitutional:  Negative for fatigue.  Gastrointestinal:  Negative for diarrhea.  Skin:  Negative for rash.       Objective:   Physical Exam Eyes:     General: No scleral icterus. Pulmonary:     Effort: Pulmonary effort is normal.  Neurological:     General: No focal deficit present.     Mental Status: He is alert.   SH; no tobacco        Assessment & Plan:

## 2022-02-12 NOTE — Assessment & Plan Note (Signed)
Creat, LFTs wnl.  No concerns.

## 2022-02-12 NOTE — Assessment & Plan Note (Signed)
He is doing much better now with the improved compliance.  His viral load is now completely suppressed with good comliance.  No changes indicated and he can rtc in 6 months.

## 2022-02-13 DIAGNOSIS — M9903 Segmental and somatic dysfunction of lumbar region: Secondary | ICD-10-CM | POA: Diagnosis not present

## 2022-02-13 DIAGNOSIS — M532X7 Spinal instabilities, lumbosacral region: Secondary | ICD-10-CM | POA: Diagnosis not present

## 2022-02-13 DIAGNOSIS — M9905 Segmental and somatic dysfunction of pelvic region: Secondary | ICD-10-CM | POA: Diagnosis not present

## 2022-02-13 DIAGNOSIS — M5136 Other intervertebral disc degeneration, lumbar region: Secondary | ICD-10-CM | POA: Diagnosis not present

## 2022-02-13 MED ORDER — NITROGLYCERIN 0.4 MG SL SUBL: 0.4000 mg | SUBLINGUAL_TABLET | SUBLINGUAL | 5 refills | Status: DC | PRN

## 2022-02-13 NOTE — Telephone Encounter (Signed)
Refilled  nitroglycerin 0.4 mg  sublingual  as needed.

## 2022-02-18 ENCOUNTER — Telehealth: Payer: Self-pay

## 2022-02-18 DIAGNOSIS — M9903 Segmental and somatic dysfunction of lumbar region: Secondary | ICD-10-CM | POA: Diagnosis not present

## 2022-02-18 DIAGNOSIS — M5136 Other intervertebral disc degeneration, lumbar region: Secondary | ICD-10-CM | POA: Diagnosis not present

## 2022-02-18 DIAGNOSIS — M532X7 Spinal instabilities, lumbosacral region: Secondary | ICD-10-CM | POA: Diagnosis not present

## 2022-02-18 DIAGNOSIS — M9905 Segmental and somatic dysfunction of pelvic region: Secondary | ICD-10-CM | POA: Diagnosis not present

## 2022-02-18 NOTE — Patient Outreach (Signed)
  Care Coordination   Initial Visit Note   02/18/2022 Name: Vincent Young MRN: [id number] DOB: [birth year]  Vincent Young is a 56 y.o. year old male who sees Lurline Del, DO for primary care. I spoke with  Vincent Young by phone today.  What matters to the patients health and wellness today?  No Concerns Expressed    Goals Addressed             This Visit's Progress    COMPLETED: Care Coordination Activities       Care Coordination Interventions: Reviewed plan for disease management. Reports adhering to treatment plans. Reports attending medical appointments as scheduled. Reviewed medications. Reports taking as prescribed. Denies concerns r/t medication management or prescription cost. He will request order for Epi-pen from PCP. Assessed social determinant of health barriers.        SDOH assessments and interventions completed:  Yes  SDOH Interventions Today    Flowsheet Row Most Recent Value  SDOH Interventions   Food Insecurity Interventions Intervention Not Indicated  Transportation Interventions Intervention Not Indicated        Care Coordination Interventions Activated:  Yes  Care Coordination Interventions:  Yes, provided   Follow up plan:  Mr. Bonn will call for care coordination assistance if needed.    Encounter Outcome:  Pt. Visit Completed    [facility] [phone]

## 2022-02-18 NOTE — Patient Instructions (Signed)
Visit Information  Thank you for taking time to speak with me today! Please don't hesitate to call if you require assistance.  Following are the goals we discussed today:   Goals Addressed             This Visit's Progress    COMPLETED: Care Coordination Activities       Care Coordination Interventions: Reviewed plan for disease management. Reports adhering to treatment plans. Reports attending medical appointments as scheduled. Reviewed medications. Reports taking as prescribed. Denies concerns r/t medication management or prescription cost. He will request order for Epi-pen from PCP. Assessed social determinant of health barriers.        [name] verbalized understanding of the information discussed during the telephonic outreach. Declined need for mailed instructions or resources.  [name] will call for care coordination assistance if needed.   [facility] [phone]

## 2022-02-20 DIAGNOSIS — M532X7 Spinal instabilities, lumbosacral region: Secondary | ICD-10-CM | POA: Diagnosis not present

## 2022-02-20 DIAGNOSIS — M5136 Other intervertebral disc degeneration, lumbar region: Secondary | ICD-10-CM | POA: Diagnosis not present

## 2022-02-20 DIAGNOSIS — M9903 Segmental and somatic dysfunction of lumbar region: Secondary | ICD-10-CM | POA: Diagnosis not present

## 2022-02-20 DIAGNOSIS — M9905 Segmental and somatic dysfunction of pelvic region: Secondary | ICD-10-CM | POA: Diagnosis not present

## 2022-02-22 ENCOUNTER — Other Ambulatory Visit (HOSPITAL_COMMUNITY): Payer: Self-pay

## 2022-02-23 ENCOUNTER — Other Ambulatory Visit: Payer: Self-pay | Admitting: Cardiovascular Disease

## 2022-02-26 ENCOUNTER — Other Ambulatory Visit: Payer: Self-pay

## 2022-02-26 ENCOUNTER — Emergency Department (HOSPITAL_BASED_OUTPATIENT_CLINIC_OR_DEPARTMENT_OTHER)
Admission: EM | Admit: 2022-02-26 | Discharge: 2022-02-26 | Disposition: A | Discharge status: Home / Self Care | Payer: Medicare HMO | Attending: Emergency Medicine | Admitting: Emergency Medicine

## 2022-02-26 ENCOUNTER — Encounter (HOSPITAL_BASED_OUTPATIENT_CLINIC_OR_DEPARTMENT_OTHER): Payer: Self-pay | Admitting: Emergency Medicine

## 2022-02-26 ENCOUNTER — Emergency Department (HOSPITAL_BASED_OUTPATIENT_CLINIC_OR_DEPARTMENT_OTHER): Payer: Medicare HMO

## 2022-02-26 DIAGNOSIS — Z7952 Long term (current) use of systemic steroids: Secondary | ICD-10-CM | POA: Insufficient documentation

## 2022-02-26 DIAGNOSIS — Z7982 Long term (current) use of aspirin: Secondary | ICD-10-CM | POA: Diagnosis not present

## 2022-02-26 DIAGNOSIS — R109 Unspecified abdominal pain: Secondary | ICD-10-CM | POA: Diagnosis not present

## 2022-02-26 DIAGNOSIS — R300 Dysuria: Secondary | ICD-10-CM | POA: Diagnosis not present

## 2022-02-26 DIAGNOSIS — K760 Fatty (change of) liver, not elsewhere classified: Secondary | ICD-10-CM | POA: Diagnosis not present

## 2022-02-26 DIAGNOSIS — I10 Essential (primary) hypertension: Secondary | ICD-10-CM | POA: Diagnosis not present

## 2022-02-26 DIAGNOSIS — N200 Calculus of kidney: Secondary | ICD-10-CM | POA: Diagnosis not present

## 2022-02-26 DIAGNOSIS — R69 Illness, unspecified: Secondary | ICD-10-CM | POA: Diagnosis not present

## 2022-02-26 DIAGNOSIS — Z21 Asymptomatic human immunodeficiency virus [HIV] infection status: Secondary | ICD-10-CM | POA: Insufficient documentation

## 2022-02-26 LAB — CBC WITH DIFFERENTIAL/PLATELET
Abs Immature Granulocytes: 0.02 10*3/uL (ref 0.00–0.07)
Basophils Absolute: 0 10*3/uL (ref 0.0–0.1)
Basophils Relative: 0 %
Eosinophils Absolute: 0.5 10*3/uL (ref 0.0–0.5)
Eosinophils Relative: 5 %
HCT: 36.4 % — ABNORMAL LOW (ref 39.0–52.0)
Hemoglobin: 12.2 g/dL — ABNORMAL LOW (ref 13.0–17.0)
Immature Granulocytes: 0 %
Lymphocytes Relative: 35 %
Lymphs Abs: 3.3 10*3/uL (ref 0.7–4.0)
MCH: 31.9 pg (ref 26.0–34.0)
MCHC: 33.5 g/dL (ref 30.0–36.0)
MCV: 95.3 fL (ref 80.0–100.0)
Monocytes Absolute: 0.7 10*3/uL (ref 0.1–1.0)
Monocytes Relative: 7 %
Neutro Abs: 5 10*3/uL (ref 1.7–7.7)
Neutrophils Relative %: 53 %
Platelets: 270 10*3/uL (ref 150–400)
RBC: 3.82 MIL/uL — ABNORMAL LOW (ref 4.22–5.81)
RDW: 14.3 % (ref 11.5–15.5)
WBC: 9.5 10*3/uL (ref 4.0–10.5)
nRBC: 0 % (ref 0.0–0.2)

## 2022-02-26 LAB — BASIC METABOLIC PANEL
Anion gap: 9 (ref 5–15)
BUN: 15 mg/dL (ref 6–20)
CO2: 24 mmol/L (ref 22–32)
Calcium: 9.3 mg/dL (ref 8.9–10.3)
Chloride: 107 mmol/L (ref 98–111)
Creatinine, Ser: 1.27 mg/dL — ABNORMAL HIGH (ref 0.61–1.24)
GFR, Estimated: >60 mL/min (ref >60)
Glucose, Bld: 127 mg/dL — ABNORMAL HIGH (ref 70–99)
Potassium: 4 mmol/L (ref 3.5–5.1)
Sodium: 140 mmol/L (ref 135–145)

## 2022-02-26 LAB — URINALYSIS, ROUTINE W REFLEX MICROSCOPIC
Bilirubin Urine: NEGATIVE
Glucose, UA: NEGATIVE mg/dL
Hgb urine dipstick: NEGATIVE
Ketones, ur: NEGATIVE mg/dL
Leukocytes,Ua: NEGATIVE
Nitrite: NEGATIVE
Protein, ur: NEGATIVE mg/dL
Specific Gravity, Urine: 1.013 (ref 1.005–1.030)
pH: 5.5 (ref 5.0–8.0)

## 2022-02-26 MED ORDER — ONDANSETRON 8 MG PO TBDP: 8.0000 mg | ORAL_TABLET | Freq: Three times a day (TID) | ORAL | 0 refills | Status: DC | PRN

## 2022-02-26 MED ORDER — MORPHINE SULFATE (PF) 4 MG/ML IV SOLN
4.0000 mg | Freq: Once | INTRAVENOUS | Status: AC
  Administered 2022-02-26: 4 mg via INTRAVENOUS
  Filled 2022-02-26: qty 1

## 2022-02-26 MED ORDER — ONDANSETRON HCL 4 MG/2ML IJ SOLN
4.0000 mg | Freq: Once | INTRAMUSCULAR | Status: AC
  Administered 2022-02-26: 4 mg via INTRAVENOUS
  Filled 2022-02-26: qty 2

## 2022-02-26 MED ORDER — OXYCODONE-ACETAMINOPHEN 5-325 MG PO TABS: 1.0000 | ORAL_TABLET | Freq: Four times a day (QID) | ORAL | 0 refills | Status: DC | PRN

## 2022-02-26 NOTE — ED Notes (Signed)
Pt verbalizes understanding of discharge instructions. Opportunity for questioning and answers were provided. Pt discharged from ED to home with family.    

## 2022-02-26 NOTE — Discharge Instructions (Addendum)
You are seen today in the emergency department for flank pain.  The scan does not show any obstructing kidney stones with 80 of kidney stones on both sides.  I copied the CT results below for your reference.  Take the narcotic medicine as needed for pain, take the Zofran 5 to 10 minutes before you take the medicine out prevent nausea and vomiting.  Follow-up with your primary in the next 2 days for reevaluation.  Return to the ED if the pain becomes constant, vomiting, fevers, new symptoms or uncontrollable pain at home.   CT ABDOMEN AND PELVIS WITHOUT CONTRAST    TECHNIQUE:  Multidetector CT imaging of the abdomen and pelvis was performed  following the standard protocol without IV contrast.    RADIATION DOSE REDUCTION: This exam was performed according to the  departmental dose-optimization program which includes automated  exposure control, adjustment of the mA and/or kV according to  patient size and/or use of iterative reconstruction technique.    COMPARISON:  08/26/2020    FINDINGS:  Lower chest: No acute airspace disease or pleural effusion.    Hepatobiliary: Diffuse hepatic steatosis with focal fatty sparing  adjacent to the gallbladder fossa. The liver is mildly enlarged  spanning 18.2 cm cranial caudal. No focal liver lesion on this  unenhanced exam Gallbladder physiologically distended, no calcified  stone. No biliary dilatation.    Pancreas: No ductal dilatation or inflammation.    Spleen: Normal in size without focal abnormality. Small splenule at  the hilum.    Adrenals/Urinary Tract: Normal adrenal glands. Three punctate  nonobstructing intrarenal calculi in the right kidney. Single  punctate nonobstructing stone in the lower pole of the left kidney.  No hydronephrosis. No ureteral calculi. Minimally distended urinary  bladder, no wall thickening or bladder stones.    Stomach/Bowel: Unremarkable appearance of the stomach. Occasional  fluid-filled small bowel in the  left abdomen but no wall thickening,  obstruction or inflammation. The appendix is not visualized,  appendectomy per history. Moderate stool in the ascending and  transverse colon. Small volume of stool in the distal colon.  Descending and sigmoid colonic diverticulosis. No diverticulitis or  acute colonic inflammation.    Vascular/Lymphatic: Aortic atherosclerosis. No aneurysm. No enlarged  lymph nodes in the abdomen or pelvis.    Reproductive: Prostate is unremarkable.    Other: No ascites. No free air. No focal fluid collection. Tiny fat  containing umbilical hernia.    Musculoskeletal: Diffuse degenerative change throughout the lumbar  spine with facet hypertrophy and degenerative disc disease. There  are no acute or suspicious osseous abnormalities.    IMPRESSION:  1. Bilateral nonobstructing nephrolithiasis. No hydronephrosis or  obstructive uropathy.  2. Hepatic steatosis and mild hepatomegaly.  3. Colonic diverticulosis without diverticulitis.    Aortic Atherosclerosis (ICD10-I70.0).      Electronically Signed    By: Keith Rake M.D.    On: 02/26/2022 21:52

## 2022-02-26 NOTE — ED Provider Notes (Signed)
[facility] DEPT Provider Note   CSN: [id number] Arrival date & time: 02/26/22  2037     History  Chief Complaint  Patient presents with   Flank Pain    Vincent Young is a 56 y.o. male.   Flank Pain     Patient with medical history of hypertension, morbid obesity, DJD, HIV on Biktarvy presents today due to left flank pain.  Started 4 days ago, pain is constant. its worse with movement.  Associated with nausea but no vomiting.  Denies any dysuria, hematuria, abdominal pain, shortness of breath, chest pain, hemoptysis.  No history of kidney stones, denies any history of UTIs or pyelonephritis.  Home Medications Prior to Admission medications   Medication Sig Start Date End Date Taking? Authorizing Provider  acetaminophen (TYLENOL) 650 MG CR tablet 3 tablets    [provider]  albuterol (PROVENTIL HFA;VENTOLIN HFA) 108 (90 BASE) MCG/ACT inhaler Inhale 1-2 puffs into the lungs every 6 (six) hours as needed for wheezing or shortness of breath. 05/15/14   Ezequiel Essex, MD  aspirin EC 81 MG tablet Take 1 tablet (81 mg total) by mouth daily. Swallow whole. 03/01/20   O'NealCassie Freer, MD  atorvastatin (LIPITOR) 80 MG tablet Take 1 tablet (80 mg total) by mouth daily. 02/25/22   Geralynn Rile, MD  bictegravir-emtricitabine-tenofovir AF (BIKTARVY) 50-200-25 MG TABS tablet TAKE 1 TABLET BY MOUTH DAILY. 07/27/21 07/27/22  ComerOkey Regal, MD  doxycycline (VIBRAMYCIN) 100 MG capsule Take 100 mg by mouth 2 (two) times daily. 02/07/22   [provider]  fluticasone (FLONASE) 50 MCG/ACT nasal spray Place 2 sprays into both nostrils daily as needed for allergies.  06/01/18   [provider]  loratadine (CLARITIN) 10 MG tablet Take 10 mg by mouth daily as needed for allergies.     [provider]  losartan (COZAAR) 50 MG tablet Take 50 mg by mouth daily. 11/25/19   [provider]  meloxicam (MOBIC) 15 MG tablet Take  15 mg by mouth daily. 10/26/19   [provider]  nitroGLYCERIN (NITROSTAT) 0.4 MG SL tablet Place 1 tablet (0.4 mg total) under the tongue every 5 (five) minutes as needed. 02/13/22 05/14/22  O'NealCassie Freer, MD  predniSONE (STERAPRED UNI-PAK 21 TAB) 10 MG (21) TBPK tablet Take by mouth. 02/07/22   [provider]      Allergies    Black walnut pollen allergy skin test, Shellfish allergy, Other, and Penicillins    Review of Systems   Review of Systems  Genitourinary:  Positive for flank pain.    Physical Exam Updated Vital Signs BP (!) 140/100   Pulse 99   Temp 98.7 F (37.1 C) (Oral)   Resp 16   SpO2 94%  Physical Exam Vitals and nursing note reviewed. Exam conducted with a chaperone present.  Constitutional:      Appearance: Normal appearance. He is obese.  HENT:     Head: Normocephalic and atraumatic.  Eyes:     General: No scleral icterus.       Right eye: No discharge.        Left eye: No discharge.     Extraocular Movements: Extraocular movements intact.     Pupils: Pupils are equal, round, and reactive to light.  Cardiovascular:     Rate and Rhythm: Normal rate and regular rhythm.     Pulses: Normal pulses.     Heart sounds: Normal heart sounds. No murmur heard.    No  friction rub. No gallop.  Pulmonary:     Effort: Pulmonary effort is normal. No respiratory distress.     Breath sounds: Normal breath sounds.  Abdominal:     General: Abdomen is flat. Bowel sounds are normal. There is no distension.     Palpations: Abdomen is soft.     Tenderness: There is no abdominal tenderness. There is left CVA tenderness.  Skin:    General: Skin is warm and dry.     Coloration: Skin is not jaundiced.  Neurological:     Mental Status: He is alert. Mental status is at baseline.     Coordination: Coordination normal.     ED Results / Procedures / Treatments   Labs (all labs ordered are listed, but only abnormal results are displayed) Labs Reviewed   URINE CULTURE  URINALYSIS, ROUTINE W REFLEX MICROSCOPIC  CBC WITH DIFFERENTIAL/PLATELET  BASIC METABOLIC PANEL    EKG None  Radiology No results found.  Procedures Procedures    Medications Ordered in ED Medications  ondansetron (ZOFRAN) injection 4 mg (has no administration in time range)  morphine (PF) 4 MG/ML injection 4 mg (has no administration in time range)    ED Course/ Medical Decision Making/ A&P                           Medical Decision Making Amount and/or Complexity of Data Reviewed Labs: ordered. Radiology: ordered.  Risk Prescription drug management.   Patient presents due to flank pain.  Differential clues but not limited to nephrolithiasis, pyelonephritis, UTI, obstructive uropathy, atypical PE or pneumonia.  Patient has CVA tenderness on exam.  Lungs are clear to auscultation, afebrile nontoxic-appearing.  His abdomen is soft and nontender.  No suprapubic or abdominal tenderness.  I ordered, viewed and interpreted laboratory work-up. CBC without cytosis, stable anemia with a hemoglobin of 12.2. BMP without gross electrolyte derangement.  Creatinine is mildly elevated at 1.27 but per chart review this is roughly baseline. UA is negative for signs of UTI, no hematuria the patient tested above nephrolithiasis.  Urine culture obtained and is pending.  I ordered CT renal study.  No signs of obstructive uropathy.  He has bilateral kidney stones which we discussed.  I ordered medication including morphine and Zofran.  Reviewed his home medication, he is taking Biktarvy. I viewed external medical records including his previous laboratory work-up.  He is viral load is nondetectable, CD4 count roughly unchanged.  This was obtained a few months ago.  I reevaluated the patient who is feeling improved.  It is possible he had a stone that was not fully obstructive that moved.  I discussed return precautions with the patient.  I see no signs of UTI,  pyelonephritis, nephrolithiasis.  No septic stone.  He is tolerating p.o., nontoxic-appearing.  His lungs are clear to auscultation I do not think a chest x-ray would be helpful, additionally he is not hypoxic, not tachycardic and is has no chest pain or shortness of breath so atypical PE is very unlikely as well.  Will send home with short course of pain medicine and nausea medicine. Patient will return if worse.        Final Clinical Impression(s) / ED Diagnoses Final diagnoses:  None    Rx / DC Orders ED Discharge Orders     None         Sherrill Raring, Hershal Coria 02/26/22 2317    Davonna Belling, MD 02/26/22 2352

## 2022-02-26 NOTE — ED Notes (Signed)
Patient transported to CT via stretcher.

## 2022-02-26 NOTE — ED Triage Notes (Signed)
Left sided flank pain started Saturday.  Is able to rest but constant pain.

## 2022-02-27 DIAGNOSIS — M9905 Segmental and somatic dysfunction of pelvic region: Secondary | ICD-10-CM | POA: Diagnosis not present

## 2022-02-27 DIAGNOSIS — M9903 Segmental and somatic dysfunction of lumbar region: Secondary | ICD-10-CM | POA: Diagnosis not present

## 2022-02-27 DIAGNOSIS — M532X7 Spinal instabilities, lumbosacral region: Secondary | ICD-10-CM | POA: Diagnosis not present

## 2022-02-27 DIAGNOSIS — M5136 Other intervertebral disc degeneration, lumbar region: Secondary | ICD-10-CM | POA: Diagnosis not present

## 2022-02-28 LAB — URINE CULTURE: Culture: NO GROWTH

## 2022-03-05 DIAGNOSIS — M9905 Segmental and somatic dysfunction of pelvic region: Secondary | ICD-10-CM | POA: Diagnosis not present

## 2022-03-05 DIAGNOSIS — M9903 Segmental and somatic dysfunction of lumbar region: Secondary | ICD-10-CM | POA: Diagnosis not present

## 2022-03-05 DIAGNOSIS — M5136 Other intervertebral disc degeneration, lumbar region: Secondary | ICD-10-CM | POA: Diagnosis not present

## 2022-03-05 DIAGNOSIS — M532X7 Spinal instabilities, lumbosacral region: Secondary | ICD-10-CM | POA: Diagnosis not present

## 2022-03-11 DIAGNOSIS — M5136 Other intervertebral disc degeneration, lumbar region: Secondary | ICD-10-CM | POA: Diagnosis not present

## 2022-03-11 DIAGNOSIS — M532X7 Spinal instabilities, lumbosacral region: Secondary | ICD-10-CM | POA: Diagnosis not present

## 2022-03-11 DIAGNOSIS — M9905 Segmental and somatic dysfunction of pelvic region: Secondary | ICD-10-CM | POA: Diagnosis not present

## 2022-03-11 DIAGNOSIS — M9903 Segmental and somatic dysfunction of lumbar region: Secondary | ICD-10-CM | POA: Diagnosis not present

## 2022-03-19 ENCOUNTER — Other Ambulatory Visit (HOSPITAL_COMMUNITY): Payer: Self-pay

## 2022-03-19 DIAGNOSIS — M5136 Other intervertebral disc degeneration, lumbar region: Secondary | ICD-10-CM | POA: Diagnosis not present

## 2022-03-19 DIAGNOSIS — M532X7 Spinal instabilities, lumbosacral region: Secondary | ICD-10-CM | POA: Diagnosis not present

## 2022-03-19 DIAGNOSIS — M9905 Segmental and somatic dysfunction of pelvic region: Secondary | ICD-10-CM | POA: Diagnosis not present

## 2022-03-19 DIAGNOSIS — M9903 Segmental and somatic dysfunction of lumbar region: Secondary | ICD-10-CM | POA: Diagnosis not present

## 2022-03-22 ENCOUNTER — Other Ambulatory Visit (HOSPITAL_COMMUNITY): Payer: Self-pay

## 2022-03-25 DIAGNOSIS — M9905 Segmental and somatic dysfunction of pelvic region: Secondary | ICD-10-CM | POA: Diagnosis not present

## 2022-03-25 DIAGNOSIS — M5136 Other intervertebral disc degeneration, lumbar region: Secondary | ICD-10-CM | POA: Diagnosis not present

## 2022-03-25 DIAGNOSIS — M532X7 Spinal instabilities, lumbosacral region: Secondary | ICD-10-CM | POA: Diagnosis not present

## 2022-03-25 DIAGNOSIS — M9903 Segmental and somatic dysfunction of lumbar region: Secondary | ICD-10-CM | POA: Diagnosis not present

## 2022-03-27 ENCOUNTER — Other Ambulatory Visit (HOSPITAL_COMMUNITY): Payer: Self-pay

## 2022-03-27 DIAGNOSIS — M9905 Segmental and somatic dysfunction of pelvic region: Secondary | ICD-10-CM | POA: Diagnosis not present

## 2022-03-27 DIAGNOSIS — M5136 Other intervertebral disc degeneration, lumbar region: Secondary | ICD-10-CM | POA: Diagnosis not present

## 2022-03-27 DIAGNOSIS — M532X7 Spinal instabilities, lumbosacral region: Secondary | ICD-10-CM | POA: Diagnosis not present

## 2022-03-27 DIAGNOSIS — M9903 Segmental and somatic dysfunction of lumbar region: Secondary | ICD-10-CM | POA: Diagnosis not present

## 2022-04-03 ENCOUNTER — Other Ambulatory Visit: Payer: Self-pay | Admitting: Internal Medicine

## 2022-04-03 ENCOUNTER — Other Ambulatory Visit (HOSPITAL_COMMUNITY): Payer: Self-pay

## 2022-04-03 DIAGNOSIS — B20 Human immunodeficiency virus [HIV] disease: Secondary | ICD-10-CM

## 2022-04-03 MED ORDER — BIKTARVY 50-200-25 MG PO TABS
1.0000 | ORAL_TABLET | Freq: Every day | ORAL | 6 refills | Status: DC
  Filled 2022-04-03: qty 30, 30d supply, fill #0
  Filled 2022-05-21: qty 30, 30d supply, fill #1
  Filled 2022-06-13: qty 30, 30d supply, fill #2
  Filled 2022-07-10 – 2022-07-17 (×2): qty 30, 30d supply, fill #3
  Filled 2022-08-06: qty 30, 30d supply, fill #4
  Filled 2022-10-18 (×2): qty 30, 30d supply, fill #5
  Filled 2022-11-11: qty 30, 30d supply, fill #6

## 2022-04-04 ENCOUNTER — Other Ambulatory Visit (HOSPITAL_COMMUNITY): Payer: Self-pay

## 2022-04-04 DIAGNOSIS — K219 Gastro-esophageal reflux disease without esophagitis: Secondary | ICD-10-CM | POA: Diagnosis not present

## 2022-04-04 DIAGNOSIS — M25561 Pain in right knee: Secondary | ICD-10-CM | POA: Diagnosis not present

## 2022-04-04 DIAGNOSIS — I10 Essential (primary) hypertension: Secondary | ICD-10-CM | POA: Diagnosis not present

## 2022-04-04 DIAGNOSIS — M25562 Pain in left knee: Secondary | ICD-10-CM | POA: Diagnosis not present

## 2022-04-04 DIAGNOSIS — E1169 Type 2 diabetes mellitus with other specified complication: Secondary | ICD-10-CM | POA: Diagnosis not present

## 2022-04-04 DIAGNOSIS — M170 Bilateral primary osteoarthritis of knee: Secondary | ICD-10-CM | POA: Diagnosis not present

## 2022-04-04 DIAGNOSIS — E785 Hyperlipidemia, unspecified: Secondary | ICD-10-CM | POA: Diagnosis not present

## 2022-04-04 DIAGNOSIS — J4541 Moderate persistent asthma with (acute) exacerbation: Secondary | ICD-10-CM | POA: Diagnosis not present

## 2022-04-17 ENCOUNTER — Telehealth: Payer: Self-pay | Admitting: Cardiovascular Disease

## 2022-04-17 MED ORDER — NITROGLYCERIN 0.4 MG SL SUBL: 0.4000 mg | SUBLINGUAL_TABLET | SUBLINGUAL | 5 refills | Status: AC | PRN

## 2022-04-17 NOTE — Telephone Encounter (Signed)
Called and spoke with patient who reports that he has been having chest pain for the last few days. Patient reports the chest pain is in the center of his chest and goes to his left arm/ should blade. Patient reports that the last incidence of chest pain was this morning at 730/8am this morning and states that this occurred at rest. Patient described the chest pain as sharp.  Patient denies any shortness of breath, or any pain/pressure with activity. Patient reports lightheadedness upon walking in the morning. Denies any swelling. Patient states he has tried stuff for acid reflux with no relief and reports he does not feel that he has injured his chest/shoulder muscles. Offered patient an appointment with APP tomorrow- patient unable to make it. Scheduled patient with APP in office this Friday 12/1 with Diona Browner NP. Made patient aware of ED precautions should new or worsening symptoms develop. Patient verbalized understanding.   Will forward to MD to make aware.

## 2022-04-17 NOTE — Telephone Encounter (Signed)
*  STAT* If patient is at the pharmacy, call can be transferred to refill team.   1. Which medications need to be refilled? (please list name of each medication and dose if known) nitroGLYCERIN (NITROSTAT) 0.4 MG SL tablet  2. Which pharmacy/location (including street and city if local pharmacy) is medication to be sent to? [id number]- [place], [place] - [address] [place] [address]. Do they need a 30 day or 90 day supply?   Standard emergency supply  Patient states he is completely out of medication

## 2022-04-17 NOTE — Telephone Encounter (Signed)
Pt c/o of Chest Pain: STAT if CP now or developed within 24 hours  1. Are you having CP right now?  No   2. Are you experiencing any other symptoms (ex. SOB, nausea, vomiting, sweating)?  Added stress in his life recently, dizziness in the past few mornings   3. How long have you been experiencing CP?  Past 2-3 days, upper chest and shoulders  4. Is your CP continuous or coming and going?  Coming and going  5. Have you taken Nitroglycerin?  No, patient requested a refill ?

## 2022-04-17 NOTE — Telephone Encounter (Signed)
Refills has been sent to the pharmacy. 

## 2022-04-19 ENCOUNTER — Other Ambulatory Visit: Payer: Self-pay | Admitting: Internal Medicine

## 2022-04-19 ENCOUNTER — Other Ambulatory Visit (HOSPITAL_COMMUNITY): Payer: Self-pay

## 2022-04-19 ENCOUNTER — Ambulatory Visit: Payer: Medicare HMO | Admitting: Nurse Practitioner

## 2022-04-19 DIAGNOSIS — B20 Human immunodeficiency virus [HIV] disease: Secondary | ICD-10-CM

## 2022-04-19 NOTE — Progress Notes (Deleted)
Office Visit    Patient Name: Vincent Young Date of Encounter: 04/19/2022  Primary Care Provider:  Lurline Del, DO Primary Cardiologist:  Evalina Field, MD  Chief Complaint    56 year old male with a history of nonobstructive CAD, hypertension, hyperlipidemia, type 2 diabetes, OSA, GERD, HIV, and obesity who presents for follow-up related to CAD and chest pain.  Past Medical History    Past Medical History:  Diagnosis Date   Acute renal failure (HCC)    due to dehydration   Arthritis    Asthma    Coronary artery disease    Diabetes mellitus without complication (HCC)    DJD (degenerative joint disease)    Exposure to TB    Family history of adverse reaction to anesthesia    " My brother has had PONV"   GERD (gastroesophageal reflux disease)    Headache    HIV infection ([place])    Hypertension    Obesity    OSA (obstructive sleep apnea) 07/12/2020   Pilonidal cyst    Recurrent genital HSV (herpes simplex virus) infection    Sleep apnea    never tested-has s/s   Past Surgical History:  Procedure Laterality Date   APPENDECTOMY     CHONDROPLASTY  12/16/2011   Procedure: CHONDROPLASTY;  Surgeon: Kerin Salen, MD;  Location: [facility];  Service: Orthopedics;  Laterality: Right;  Right knee medial and lateral menisectomy and Debridement 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,Trochlea    COLONOSCOPY W/ BIOPSIES AND POLYPECTOMY     COLONOSCOPY WITH PROPOFOL N/A 08/29/2014   Procedure: COLONOSCOPY WITH PROPOFOL;  Surgeon: Clarene Essex, MD;  Location[facility] END[facility];  Service: Endoscopy;  Laterality: N/A;   COLONOSCOPY WITH PROPOFOL N/A 11/16/2019   Procedure: COLONOSCOPY WITH PROPOFOL;  Surgeon: Clarene Essex, MD;  Location: [facility] ENDOSCOPY;  Service: Endoscopy;  Laterality: N/A;   HERNIA REPAIR     inguinal as a child   LEFT HEART CATH AND CORONARY ANGIOGRAPHY N/A 03/15/2020   Procedure: LEFT HEART CATH AND CORONARY ANGIOGRAPHY;   Surgeon: Leonie Man, MD;  Location: [facility];  Service: Cardiovascular;  Laterality: N/A;   PEG TUBE PLACEMENT  2009   POLYPECTOMY  11/16/2019   Procedure: POLYPECTOMY;  Surgeon: Clarene Essex, MD;  Location: [facility] ENDOSCOPY;  Service: Endoscopy;;   TONSILLECTOMY     UVULECTOMY  2009    Allergies  Allergies  Allergen Reactions   Black Walnut Pollen Allergy Skin Test Anaphylaxis   Shellfish Allergy Anaphylaxis   Other Other (See Comments)    Tree nuts cause throat swelling Grass pollen causes itchy, sneezing   Penicillins Rash    10 years ago    History of Present Illness    56 year old male with the above past medical history including nonobstructive CAD, hypertension, hyperlipidemia, type 2 diabetes, OSA, GERD, HIV, and obesity,  Echocardiogram in 01/2020 showed EF 55 to 60%, normal LV function, no RWMA, mild LVH, normal RV systolic function, no significant valvular abnormalities.  Coronary CTA in 02/2020 revealed calcium score of 195 (94th percentile), concern for severe stenosis in the proximal diagonal 1, as well as significant plaque in the distal LAD.  Cardiac catheterization in 02/2020 revealed d-apical LAD 55%, D1 45%, and Ramus 55% stenoses.  Last seen in the office on 11/02/2021 and was stable from a cardiac standpoint.  He denied any symptoms concerning for angina.  He contacted our office on 04/17/2022 and reported a few day history  of nonexertional sharp chest pain in the center of his chest that radiated to his left arm and shoulder blade,   He presents today for follow-up.  Since his last visit  Nonobstructive CAD/chest pain: Hypertension: Hyperlipidemia: Type 2 diabetes: OSA: Obesity: Disposition:  Home Medications    Current Outpatient Medications  Medication Sig Dispense Refill   acetaminophen (TYLENOL) 650 MG CR tablet 3 tablets     albuterol (PROVENTIL HFA;VENTOLIN HFA) 108 (90 BASE) MCG/ACT inhaler Inhale 1-2 puffs into the lungs every 6 (six)  hours as needed for wheezing or shortness of breath. 1 Inhaler 0   aspirin EC 81 MG tablet Take 1 tablet (81 mg total) by mouth daily. Swallow whole. 90 tablet 3   atorvastatin (LIPITOR) 80 MG tablet Take 1 tablet (80 mg total) by mouth daily. 90 tablet 3   bictegravir-emtricitabine-tenofovir AF (BIKTARVY) 50-200-25 MG TABS tablet TAKE 1 TABLET BY MOUTH DAILY. 30 tablet 6   doxycycline (VIBRAMYCIN) 100 MG capsule Take 100 mg by mouth 2 (two) times daily.     fluticasone (FLONASE) 50 MCG/ACT nasal spray Place 2 sprays into both nostrils daily as needed for allergies.      loratadine (CLARITIN) 10 MG tablet Take 10 mg by mouth daily as needed for allergies.      losartan (COZAAR) 50 MG tablet Take 50 mg by mouth daily.     meloxicam (MOBIC) 15 MG tablet Take 15 mg by mouth daily.     nitroGLYCERIN (NITROSTAT) 0.4 MG SL tablet Place 1 tablet (0.4 mg total) under the tongue every 5 (five) minutes as needed. 25 tablet 5   ondansetron (ZOFRAN-ODT) 8 MG disintegrating tablet Take 1 tablet (8 mg total) by mouth every 8 (eight) hours as needed for nausea or vomiting. 20 tablet 0   oxyCODONE-acetaminophen (PERCOCET/ROXICET) 5-325 MG tablet Take 1 tablet by mouth every 6 (six) hours as needed for severe pain. 6 tablet 0   predniSONE (STERAPRED UNI-PAK 21 TAB) 10 MG (21) TBPK tablet Take by mouth.     Current Facility-Administered Medications  Medication Dose Route Frequency Provider Last Rate Last Admin   sodium chloride flush (NS) 0.9 % injection 3 mL  3 mL Intravenous Q12H O'Neal, Cassie Freer, MD         Review of Systems    ***.  All other systems reviewed and are otherwise negative except as noted above.    Physical Exam    VS:  There were no vitals taken for this visit. , BMI There is no height or weight on file to calculate BMI.     GEN: Well nourished, well developed, in no acute distress. HEENT: normal. Neck: Supple, no JVD, carotid bruits, or masses. Cardiac: RRR, no murmurs, rubs, or  gallops. No clubbing, cyanosis, edema.  Radials/DP/PT 2+ and equal bilaterally.  Respiratory:  Respirations regular and unlabored, clear to auscultation bilaterally. GI: Soft, nontender, nondistended, BS + x 4. MS: no deformity or atrophy. Skin: warm and dry, no rash. Neuro:  Strength and sensation are intact. Psych: Normal affect.  Accessory Clinical Findings    ECG personally reviewed by me today - *** - no acute changes.   Lab Results  Component Value Date   WBC 9.5 02/26/2022   HGB 12.2 (L) 02/26/2022   HCT 36.4 (L) 02/26/2022   MCV 95.3 02/26/2022   PLT 270 02/26/2022   Lab Results  Component Value Date   CREATININE 1.27 (H) 02/26/2022   BUN 15 02/26/2022   NA 140 02/26/2022  K 4.0 02/26/2022   CL 107 02/26/2022   CO2 24 02/26/2022   Lab Results  Component Value Date   ALT 17 01/16/2022   AST 16 01/16/2022   ALKPHOS 101 08/26/2020   BILITOT 0.3 01/16/2022   Lab Results  Component Value Date   CHOL 112 01/16/2022   HDL 45 01/16/2022   LDLCALC 53 01/16/2022   TRIG 67 01/16/2022   CHOLHDL 2.5 01/16/2022    No results found for: "HGBA1C"  Assessment & Plan    1.  ***  No BP recorded.  {Refresh Note OR Click here to enter BP  :1}***   Lenna Sciara, NP 04/19/2022, 6:13 AM

## 2022-04-22 ENCOUNTER — Other Ambulatory Visit (HOSPITAL_COMMUNITY): Payer: Self-pay

## 2022-04-22 ENCOUNTER — Other Ambulatory Visit: Payer: Self-pay | Admitting: Internal Medicine

## 2022-04-22 DIAGNOSIS — M25562 Pain in left knee: Secondary | ICD-10-CM | POA: Diagnosis not present

## 2022-04-22 DIAGNOSIS — M170 Bilateral primary osteoarthritis of knee: Secondary | ICD-10-CM | POA: Diagnosis not present

## 2022-04-22 DIAGNOSIS — M25561 Pain in right knee: Secondary | ICD-10-CM | POA: Diagnosis not present

## 2022-04-22 MED ORDER — VALACYCLOVIR HCL 500 MG PO TABS
1000.0000 mg | ORAL_TABLET | Freq: Every day | ORAL | 2 refills | Status: DC
  Filled 2022-04-22: qty 60, 30d supply, fill #0
  Filled 2022-07-10: qty 60, 30d supply, fill #1
  Filled 2022-09-27: qty 60, 30d supply, fill #2

## 2022-04-22 NOTE — Telephone Encounter (Signed)
Please advise on refill.

## 2022-04-23 ENCOUNTER — Other Ambulatory Visit (HOSPITAL_COMMUNITY): Payer: Self-pay

## 2022-04-29 ENCOUNTER — Ambulatory Visit: Payer: Medicare HMO | Attending: Nurse Practitioner | Admitting: Cardiovascular Disease

## 2022-04-29 ENCOUNTER — Encounter: Payer: Self-pay | Admitting: Cardiovascular Disease

## 2022-04-29 ENCOUNTER — Telehealth: Payer: Self-pay | Admitting: *Deleted

## 2022-04-29 VITALS — BP 170/98 | HR 85 | Ht 71.0 in | Wt 386.0 lb

## 2022-04-29 DIAGNOSIS — E782 Mixed hyperlipidemia: Secondary | ICD-10-CM | POA: Diagnosis not present

## 2022-04-29 DIAGNOSIS — I2510 Atherosclerotic heart disease of native coronary artery without angina pectoris: Secondary | ICD-10-CM

## 2022-04-29 DIAGNOSIS — G4733 Obstructive sleep apnea (adult) (pediatric): Secondary | ICD-10-CM

## 2022-04-29 MED ORDER — LOSARTAN POTASSIUM 100 MG PO TABS: 100.0000 mg | ORAL_TABLET | Freq: Every day | ORAL | 3 refills | Status: AC

## 2022-04-29 MED ORDER — AMLODIPINE BESYLATE 10 MG PO TABS: 10.0000 mg | ORAL_TABLET | Freq: Every day | ORAL | 3 refills | Status: DC

## 2022-04-29 NOTE — Telephone Encounter (Signed)
Spoke with Wells Guiles at [facility] to inquire why the patient did not get his BIPAP machine. She states the patient was scheduled to come in for set up on 11/13/21, 11/30/21, 12/07/21 and 12/14/21. He was a no show for each of these appointments therefore the order was cancelled.  Now due to the time lapse per insurance guidelines the patient has to start from the beginning with a new split night sleep study. Dr Audie Box will be notified.

## 2022-04-29 NOTE — Progress Notes (Signed)
Cardiology Office Note:   Date:  04/29/2022  NAME:  Breckan Cafiero    MRN: [id number] DOB:  Feb 24, 1966   PCP:  Lurline Del, DO  Cardiologist:  Evalina Field, MD  Electrophysiologist:  None   Referring MD: Lurline Del, DO   Chief Complaint  Patient presents with   Follow-up         History of Present Illness:   Terell Kincy is a 56 y.o. male with a hx of HIV, DM, HTN, obesity, non-obstructive CAD who presents for follow-up.  Patient reports for the past 4 weeks he has had episodes of sharp chest discomfort in the morning.  Symptoms last 30 to 40 minutes.  Symptoms are similar to prior episodes.  He reports the pain can go into his shoulder.  He reports he can massage it out.  It actually improves as the day goes on.  No intervention is pursued.  He has been diagnosed with severe sleep apnea.  He has not received the CPAP machine. He snores. He is fatigued. He falls asleep easily. We will reach out to sleep medicine.  His blood pressure is 170/98.  He has been doing blood pressure monitoring at home with values in the 190 range.  He does have small vessel CAD based on left heart catheterization.  Highly suspect this is all blood pressure and sleep apnea related.  He is working on losing weight.  BMI 53.  All of his other CV risk factors are well-controlled.  LDL cholesterol 53.  EKG is normal with no acute ischemic changes.   Problem List 1. DM -A1c 6.4 2. HLD -Total chol 112, HDL 45, LDL 53, TG 67 3. HTN 4. HIV 5. CAD -CAC score 195 (94th percentile) -45% D1, distal LAD 50%, RI 55% 6. Severe OSA  Past Medical History: Past Medical History:  Diagnosis Date   Acute renal failure (HCC)    due to dehydration   Arthritis    Asthma    Coronary artery disease    Diabetes mellitus without complication (HCC)    DJD (degenerative joint disease)    Exposure to TB    Family history of adverse reaction to anesthesia    " My brother has had PONV"   GERD (gastroesophageal  reflux disease)    Headache    HIV infection ([place])    Hypertension    Obesity    OSA (obstructive sleep apnea) 07/12/2020   Pilonidal cyst    Recurrent genital HSV (herpes simplex virus) infection    Sleep apnea    never tested-has s/s    Past Surgical History: Past Surgical History:  Procedure Laterality Date   APPENDECTOMY     CHONDROPLASTY  12/16/2011   Procedure: CHONDROPLASTY;  Surgeon: Kerin Salen, MD;  Location: [facility];  Service: Orthopedics;  Laterality: Right;  Right knee medial and lateral menisectomy and Debridement 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,Trochlea    COLONOSCOPY W/ BIOPSIES AND POLYPECTOMY     COLONOSCOPY WITH PROPOFOL N/A 08/29/2014   Procedure: COLONOSCOPY WITH PROPOFOL;  Surgeon: Clarene Essex, MD;  Location: [facility] ENDOSCOPY;  Service: Endoscopy;  Laterality: N/A;   COLONOSCOPY WITH PROPOFOL N/A 11/16/2019   Procedure: COLONOSCOPY WITH PROPOFOL;  Surgeon: Clarene Essex, MD;  Location: [facility] ENDOSCOPY;  Service: Endoscopy;  Laterality: N/A;   HERNIA REPAIR     inguinal as a child   LEFT HEART CATH AND CORONARY ANGIOGRAPHY N/A 03/15/2020   Procedure: LEFT HEART CATH  AND CORONARY ANGIOGRAPHY;  Surgeon: Leonie Man, MD;  Location: [facility];  Service: Cardiovascular;  Laterality: N/A;   PEG TUBE PLACEMENT  2009   POLYPECTOMY  11/16/2019   Procedure: POLYPECTOMY;  Surgeon: Clarene Essex, MD;  Location: [facility] ENDOSCOPY;  Service: Endoscopy;;   TONSILLECTOMY     UVULECTOMY  2009    Current Medications: Current Meds  Medication Sig   acetaminophen (TYLENOL) 650 MG CR tablet 3 tablets   albuterol (PROVENTIL HFA;VENTOLIN HFA) 108 (90 BASE) MCG/ACT inhaler Inhale 1-2 puffs into the lungs every 6 (six) hours as needed for wheezing or shortness of breath.   aspirin EC 81 MG tablet Take 1 tablet (81 mg total) by mouth daily. Swallow whole.   atorvastatin (LIPITOR) 80 MG tablet Take 1 tablet (80 mg total) by  mouth daily.   bictegravir-emtricitabine-tenofovir AF (BIKTARVY) 50-200-25 MG TABS tablet TAKE 1 TABLET BY MOUTH DAILY.   fluticasone (FLONASE) 50 MCG/ACT nasal spray Place 2 sprays into both nostrils daily as needed for allergies.    loratadine (CLARITIN) 10 MG tablet Take 10 mg by mouth daily as needed for allergies.    losartan (COZAAR) 50 MG tablet Take 50 mg by mouth daily.   meloxicam (MOBIC) 15 MG tablet Take 15 mg by mouth daily.   nitroGLYCERIN (NITROSTAT) 0.4 MG SL tablet Place 1 tablet (0.4 mg total) under the tongue every 5 (five) minutes as needed.   oxyCODONE-acetaminophen (PERCOCET/ROXICET) 5-325 MG tablet Take 1 tablet by mouth every 6 (six) hours as needed for severe pain.   valACYclovir (VALTREX) 500 MG tablet Take 2 tablets (1,000 mg total) by mouth daily.   Current Facility-Administered Medications for the 04/29/22 encounter (Office Visit) with Geralynn Rile, MD  Medication   sodium chloride flush (NS) 0.9 % injection 3 mL     Allergies:    Black walnut pollen allergy skin test, Shellfish allergy, Other, and Penicillins   Social History: Social History   Socioeconomic History   Marital status: Legally Separated    Spouse name: Not on file   Number of children: 6   Years of education: Not on file   Highest education level: Not on file  Occupational History   Occupation: [employer]   Tobacco Use   Smoking status: Former    Years: 30.00    Types: Cigarettes    Quit date: 12/04/2003    Years since quitting: 18.4   Smokeless tobacco: Never   Tobacco comments:    pt. no longer smokes  Vaping Use   Vaping Use: Never used  Substance and Sexual Activity   Alcohol use: Not Currently    Comment: occasional   Drug use: Not Currently   Sexual activity: Yes    Partners: Female    Comment: pt. declined condoms  Other Topics Concern   Not on file  Social History Narrative   Not on file   Social Determinants of Health   Financial Resource Strain: Not on file   Food Insecurity: No Food Insecurity (02/18/2022)   Hunger Vital Sign    Worried About Running Out of Food in the Last Year: Never true    Ran Out of Food in the Last Year: Never true  Transportation Needs: No Transportation Needs (02/18/2022)   PRAPARE - Hydrologist (Medical): No    Lack of Transportation (Non-Medical): No  Physical Activity: Not on file  Stress: Not on file  Social Connections: Not on file  Family History: The patient's family history includes Arthritis in his mother and sister; Heart disease in his father; Hypertension in his brother, father, mother, and sister.  ROS:   All other ROS reviewed and negative. Pertinent positives noted in the HPI.     EKGs/Labs/Other Studies Reviewed:   The following studies were personally reviewed by me today:  EKG:  EKG is ordered today.  The ekg ordered today demonstrates normal sinus rhythm heart rate 85, no acute ischemic changes or evidence of infarction, and was personally reviewed by me.   Recent Labs: 01/16/2022: ALT 17 02/26/2022: BUN 15; Creatinine, Ser 1.27; Hemoglobin 12.2; Platelets 270; Potassium 4.0; Sodium 140   Recent Lipid Panel    Component Value Date/Time   CHOL 112 01/16/2022 1143   TRIG 67 01/16/2022 1143   HDL 45 01/16/2022 1143   CHOLHDL 2.5 01/16/2022 1143   VLDL 13 12/28/2015 1135   LDLCALC 53 01/16/2022 1143    Physical Exam:   VS:  BP (!) 170/98   Pulse 85   Ht '5\' 11"'$  (1.803 m)   Wt (!) 386 lb (175.1 kg)   BMI 53.84 kg/m    Wt Readings from Last 3 Encounters:  04/29/22 (!) 386 lb (175.1 kg)  02/12/22 (!) 385 lb (174.6 kg)  11/02/21 (!) 393 lb (178.3 kg)    General: Well nourished, well developed, in no acute distress Head: Atraumatic, normal size  Eyes: PEERLA, EOMI  Neck: Supple, no JVD Endocrine: No thryomegaly Cardiac: Normal S1, S2; RRR; no murmurs, rubs, or gallops Lungs: Clear to auscultation bilaterally, no wheezing, rhonchi or rales  Abd:  Soft, nontender, no hepatomegaly  Ext: No edema, pulses 2+ Musculoskeletal: No deformities, BUE and BLE strength normal and equal Skin: Warm and dry, no rashes   Neuro: Alert and oriented to person, place, time, and situation, CNII-XII grossly intact, no focal deficits  Psych: Normal mood and affect   ASSESSMENT:   Tamim Skog is a 56 y.o. male who presents for the following: 1. Coronary artery disease involving native coronary artery of native heart without angina pectoris   2. Mixed hyperlipidemia   3. Obesity, morbid, BMI 50 or higher ([place])   4. OSA (obstructive sleep apnea)     PLAN:   1. Coronary artery disease involving native coronary artery of native heart without angina pectoris -Nonobstructive disease on left heart catheterization in 2021.  Now with sharp chest discomfort.  Symptoms do not sound like angina.  Symptoms occur in the morning.  He has untreated sleep apnea.  His blood pressure is very elevated.  Overall, believe his symptoms are likely related to blood pressure and poorly controlled sleep apnea.  We will reach out to sleep medicine to determine what we need to do to get a CPAP machine.  We will further titrate blood pressure medications with increasing his losartan to 100 mg daily and adding amlodipine 10 mg daily.  His EKG is nonischemic.  He is tender to palpation.  Symptoms could also be musculoskeletal in nature.  I recommended heat and ice.  Symptoms are not consistent with pericarditis.  He will continue aspirin and statin therapy.  LDL cholesterol is at goal.  His echo was normal as well.  If symptoms persist would recommend cardiac PET scan.  We will see how he does with blood pressure control and hopefully get his OSA treated.  2. Mixed hyperlipidemia -Well-controlled.  Continue Lipitor.  LDL at goal.  3. Obesity, morbid, BMI 50 or higher ([place])  4. OSA (obstructive sleep apnea) -Diet and exercise recommended.  Overall I believe his symptoms are likely  related to untreated sleep apnea. -STOP BANG 8. Snores. Fatigue noted. He stops breathing at night. He has BP that is difficult to control.   Disposition: Return in about 6 months (around 10/29/2022).  Medication Adjustments/Labs and Tests Ordered: Current medicines are reviewed at length with the patient today.  Concerns regarding medicines are outlined above.  No orders of the defined types were placed in this encounter.  No orders of the defined types were placed in this encounter.   There are no Patient Instructions on file for this visit.   Time Spent with Patient: I have spent a total of 25 minutes with patient reviewing hospital notes, telemetry, EKGs, labs and examining the patient as well as establishing an assessment and plan that was discussed with the patient.  > 50% of time was spent in direct patient care.  Signed, Addison Naegeli. Audie Box, MD, [facility]  [address] [place], [place] [address] [phone]  04/29/2022 9:20 AM

## 2022-04-29 NOTE — Patient Instructions (Signed)
Medication Instructions:  Your physician has recommended you make the following change in your medication:   -Increase losartan (cozaar) to '100mg'$  daily.  -Start amlodipine (norvasc) '10mg'$  once daily.  *If you need a refill on your cardiac medications before your next appointment, please call your pharmacy*   Follow-Up: At [facility], you and your health needs are our priority.  As part of our continuing mission to provide you with exceptional heart care, we have created designated Provider Care Teams.  These Care Teams include your primary Cardiologist (physician) and Advanced Practice Providers (APPs -  Physician Assistants and Nurse Practitioners) who all work together to provide you with the care you need, when you need it.  We recommend signing up for the patient portal called "MyChart".  Sign up information is provided on this After Visit Summary.  MyChart is used to connect with patients for Virtual Visits (Telemedicine).  Patients are able to view lab/test results, encounter notes, upcoming appointments, etc.  Non-urgent messages can be sent to your provider as well.   To learn more about what you can do with MyChart, go to [email or web address].    Your next appointment:   6 month(s)  The format for your next appointment:   In Person  Provider:   Evalina Field, MD     Other Instructions We will reach out to [facility] for guidance on sleep therapy.

## 2022-04-30 ENCOUNTER — Other Ambulatory Visit: Payer: Self-pay

## 2022-04-30 DIAGNOSIS — G4733 Obstructive sleep apnea (adult) (pediatric): Secondary | ICD-10-CM

## 2022-05-06 DIAGNOSIS — Z79899 Other long term (current) drug therapy: Secondary | ICD-10-CM | POA: Diagnosis not present

## 2022-05-06 DIAGNOSIS — Z5181 Encounter for therapeutic drug level monitoring: Secondary | ICD-10-CM | POA: Diagnosis not present

## 2022-05-06 DIAGNOSIS — M25561 Pain in right knee: Secondary | ICD-10-CM | POA: Diagnosis not present

## 2022-05-06 DIAGNOSIS — M1711 Unilateral primary osteoarthritis, right knee: Secondary | ICD-10-CM | POA: Diagnosis not present

## 2022-05-21 ENCOUNTER — Other Ambulatory Visit (HOSPITAL_COMMUNITY): Payer: Self-pay

## 2022-05-22 ENCOUNTER — Telehealth: Payer: Self-pay

## 2022-05-22 ENCOUNTER — Other Ambulatory Visit (HOSPITAL_COMMUNITY): Payer: Self-pay

## 2022-05-22 ENCOUNTER — Other Ambulatory Visit: Payer: Self-pay

## 2022-05-22 NOTE — Telephone Encounter (Signed)
[facility] Patient Advocate Encounter   I was successful in securing patient a $ 10,000.00 grant from [employer] to provide copayment coverage for BIKTARVY.  The patient's out of pocket cost will be $5.00 monthly.     I have spoken with the patient.    The billing information is as follows and has been shared with [facility].         Dates of Eligibility: 05/22/22 through 05/20/23  Patient knows to call the office with questions or concerns.  Ileene Patrick, [facility] Patient [facility] Phone: [phone] Fax:  [phone]

## 2022-05-30 ENCOUNTER — Telehealth: Payer: Self-pay | Admitting: *Deleted

## 2022-05-30 NOTE — Telephone Encounter (Signed)
Prior Authorization for split night sleep study sent to [facility] via web portal. Approval Number [id number].  Valid dates 05/30/22 to 11/26/22.

## 2022-05-31 DIAGNOSIS — K921 Melena: Secondary | ICD-10-CM | POA: Diagnosis not present

## 2022-05-31 DIAGNOSIS — R1031 Right lower quadrant pain: Secondary | ICD-10-CM | POA: Diagnosis not present

## 2022-06-13 ENCOUNTER — Other Ambulatory Visit (HOSPITAL_COMMUNITY): Payer: Self-pay

## 2022-06-18 ENCOUNTER — Other Ambulatory Visit: Payer: Self-pay

## 2022-06-18 ENCOUNTER — Emergency Department (HOSPITAL_BASED_OUTPATIENT_CLINIC_OR_DEPARTMENT_OTHER)
Admission: EM | Admit: 2022-06-18 | Discharge: 2022-06-18 | Disposition: A | Discharge status: Home / Self Care | Payer: Medicare HMO | Attending: Emergency Medicine | Admitting: Emergency Medicine

## 2022-06-18 ENCOUNTER — Emergency Department (HOSPITAL_BASED_OUTPATIENT_CLINIC_OR_DEPARTMENT_OTHER): Payer: Medicare HMO

## 2022-06-18 ENCOUNTER — Encounter (HOSPITAL_BASED_OUTPATIENT_CLINIC_OR_DEPARTMENT_OTHER): Payer: Self-pay | Admitting: Emergency Medicine

## 2022-06-18 DIAGNOSIS — M545 Low back pain, unspecified: Secondary | ICD-10-CM | POA: Diagnosis not present

## 2022-06-18 DIAGNOSIS — I700 Atherosclerosis of aorta: Secondary | ICD-10-CM | POA: Diagnosis not present

## 2022-06-18 DIAGNOSIS — R1032 Left lower quadrant pain: Secondary | ICD-10-CM | POA: Diagnosis not present

## 2022-06-18 DIAGNOSIS — Z743 Need for continuous supervision: Secondary | ICD-10-CM | POA: Diagnosis not present

## 2022-06-18 DIAGNOSIS — R1033 Periumbilical pain: Secondary | ICD-10-CM | POA: Diagnosis not present

## 2022-06-18 DIAGNOSIS — M549 Dorsalgia, unspecified: Secondary | ICD-10-CM | POA: Insufficient documentation

## 2022-06-18 DIAGNOSIS — R1084 Generalized abdominal pain: Secondary | ICD-10-CM | POA: Diagnosis not present

## 2022-06-18 DIAGNOSIS — I10 Essential (primary) hypertension: Secondary | ICD-10-CM | POA: Diagnosis not present

## 2022-06-18 DIAGNOSIS — R109 Unspecified abdominal pain: Secondary | ICD-10-CM | POA: Diagnosis not present

## 2022-06-18 LAB — COMPREHENSIVE METABOLIC PANEL
ALT: 10 U/L (ref 0–44)
AST: 12 U/L — ABNORMAL LOW (ref 15–41)
Albumin: 3.8 g/dL (ref 3.5–5.0)
Alkaline Phosphatase: 105 U/L (ref 38–126)
Anion gap: 8 (ref 5–15)
BUN: 19 mg/dL (ref 6–20)
CO2: 27 mmol/L (ref 22–32)
Calcium: 9.5 mg/dL (ref 8.9–10.3)
Chloride: 106 mmol/L (ref 98–111)
Creatinine, Ser: 1.44 mg/dL — ABNORMAL HIGH (ref 0.61–1.24)
GFR, Estimated: 56 mL/min — ABNORMAL LOW (ref >60)
Glucose, Bld: 141 mg/dL — ABNORMAL HIGH (ref 70–99)
Potassium: 4 mmol/L (ref 3.5–5.1)
Sodium: 141 mmol/L (ref 135–145)
Total Bilirubin: 0.3 mg/dL (ref 0.3–1.2)
Total Protein: 7.6 g/dL (ref 6.5–8.1)

## 2022-06-18 LAB — CBC
HCT: 36.7 % — ABNORMAL LOW (ref 39.0–52.0)
Hemoglobin: 12.4 g/dL — ABNORMAL LOW (ref 13.0–17.0)
MCH: 31.6 pg (ref 26.0–34.0)
MCHC: 33.8 g/dL (ref 30.0–36.0)
MCV: 93.4 fL (ref 80.0–100.0)
Platelets: 289 10*3/uL (ref 150–400)
RBC: 3.93 MIL/uL — ABNORMAL LOW (ref 4.22–5.81)
RDW: 13.2 % (ref 11.5–15.5)
WBC: 10.4 10*3/uL (ref 4.0–10.5)
nRBC: 0 % (ref 0.0–0.2)

## 2022-06-18 LAB — URINALYSIS, ROUTINE W REFLEX MICROSCOPIC
Bilirubin Urine: NEGATIVE
Glucose, UA: NEGATIVE mg/dL
Hgb urine dipstick: NEGATIVE
Ketones, ur: NEGATIVE mg/dL
Leukocytes,Ua: NEGATIVE
Nitrite: NEGATIVE
Specific Gravity, Urine: 1.045 — ABNORMAL HIGH (ref 1.005–1.030)
pH: 6 (ref 5.0–8.0)

## 2022-06-18 LAB — LIPASE, BLOOD: Lipase: 18 U/L (ref 11–51)

## 2022-06-18 MED ORDER — ONDANSETRON HCL 4 MG/2ML IJ SOLN
4.0000 mg | Freq: Once | INTRAMUSCULAR | Status: AC
  Administered 2022-06-18: 4 mg via INTRAVENOUS
  Filled 2022-06-18: qty 2

## 2022-06-18 MED ORDER — LACTATED RINGERS IV BOLUS
1000.0000 mL | Freq: Once | INTRAVENOUS | Status: AC
  Administered 2022-06-18: 1000 mL via INTRAVENOUS

## 2022-06-18 MED ORDER — MORPHINE SULFATE (PF) 4 MG/ML IV SOLN
8.0000 mg | Freq: Once | INTRAVENOUS | Status: AC
  Administered 2022-06-18: 8 mg via INTRAVENOUS
  Filled 2022-06-18: qty 2

## 2022-06-18 MED ORDER — IOHEXOL 300 MG/ML  SOLN
100.0000 mL | Freq: Once | INTRAMUSCULAR | Status: AC | PRN
  Administered 2022-06-18: 100 mL via INTRAVENOUS

## 2022-06-18 NOTE — ED Notes (Signed)
Patient transported to CT 

## 2022-06-18 NOTE — ED Provider Notes (Signed)
[facility] Provider Note   CSN: [id number] Arrival date & time: 06/18/22  1629     History Chief Complaint  Patient presents with   Flank Pain   Abdominal Pain    HPI Vincent Young is a 57 y.o. male presenting for chief complaint of abdominal pain.  He is a 57 year old male with an extensive medical history of obesity, multiple comorbid medical problems.  He states that this morning he woke up approximately 9 AM with severe left flank pain radiating to his back.  He states he has a history of nephrolithiasis. Denies fevers or chills, nausea vomiting, syncope shortness of breath.  Otherwise ambulatory tolerating p.o. intake. Patient's recorded medical, surgical, social, medication list and allergies were reviewed in the Snapshot window as part of the initial history.   Review of Systems   Review of Systems  Constitutional:  Negative for chills and fever.  HENT:  Negative for ear pain and sore throat.   Eyes:  Negative for pain and visual disturbance.  Respiratory:  Negative for cough and shortness of breath.   Cardiovascular:  Negative for chest pain and palpitations.  Gastrointestinal:  Negative for abdominal pain and vomiting.  Genitourinary:  Positive for flank pain. Negative for dysuria and hematuria.  Musculoskeletal:  Negative for arthralgias and back pain.  Skin:  Negative for color change and rash.  Neurological:  Negative for seizures and syncope.  All other systems reviewed and are negative.   Physical Exam Updated Vital Signs BP (!) 140/76   Pulse 98   Temp 98.1 F (36.7 C) (Oral)   Resp 18   Ht '5\' 11"'$  (1.803 m)   Wt (!) 174.2 kg   SpO2 99%   BMI 53.56 kg/m  Physical Exam Vitals and nursing note reviewed.  Constitutional:      General: He is not in acute distress.    Appearance: He is well-developed.  HENT:     Head: Normocephalic and atraumatic.  Eyes:     Conjunctiva/sclera: Conjunctivae normal.   Cardiovascular:     Rate and Rhythm: Normal rate and regular rhythm.     Heart sounds: No murmur heard. Pulmonary:     Effort: Pulmonary effort is normal. No respiratory distress.     Breath sounds: Normal breath sounds.  Abdominal:     Palpations: Abdomen is soft.     Tenderness: There is abdominal tenderness in the periumbilical area, suprapubic area and left lower quadrant. There is no right CVA tenderness, left CVA tenderness or guarding. Negative signs include Murphy's sign.  Musculoskeletal:        General: No swelling.     Cervical back: Neck supple.  Skin:    General: Skin is warm and dry.     Capillary Refill: Capillary refill takes less than 2 seconds.  Neurological:     Mental Status: He is alert.  Psychiatric:        Mood and Affect: Mood normal.      ED Course/ Medical Decision Making/ A&P    Procedures Procedures   Medications Ordered in ED Medications  lactated ringers bolus 1,000 mL (1,000 mLs Intravenous New Bag/Given 06/18/22 1657)  morphine (PF) 4 MG/ML injection 8 mg (8 mg Intravenous Given 06/18/22 1656)  ondansetron (ZOFRAN) injection 4 mg (4 mg Intravenous Given 06/18/22 1652)  iohexol (OMNIPAQUE) 300 MG/ML solution 100 mL (100 mLs Intravenous Contrast Given 06/18/22 1723)   Medical Decision Making:   Vincent Young is a 57 y.o.  male who presented to the ED today with abdominal pain, detailed above.    Patient's presentation is complicated by their history of multiple comorbid medical problems.  Patient placed on continuous vitals and telemetry monitoring while in ED which was reviewed periodically.  Complete initial physical exam performed, notably the patient  was hemodynamically stable in no acute distress.  He has left lower quadrant tenderness radiating into his left CVA.Marland Kitchen     Reviewed and confirmed nursing documentation for past medical history, family history, social history.    Initial Assessment:   With the patient's presentation of  abdominal pain, most likely diagnosis is musculoskeletal etiology. Other diagnoses were considered including (but not limited to) gastroenteritis, colitis, small bowel obstruction, appendicitis, cholecystitis, pancreatitis, nephrolithiasis, UTI, pyleonephritis. These are considered less likely due to history of present illness and physical exam findings.   This is most consistent with an acute life/limb threatening illness complicated by underlying chronic conditions.   Initial Plan:  CBC/CMP to evaluate for underlying infectious/metabolic etiology for patient's abdominal pain  Lipase to evaluate for pancreatitis  CTAB/Pelvis with contrast to evaluate for structural/surgical etiology of patients' severe abdominal pain.  Urinalysis and repeat physical assessment to evaluate for UTI/Pyelonpehritis  Empiric management of symptoms with escalating pain control and antiemetics as needed.   Initial Study Results:   Laboratory  All laboratory results reviewed without evidence of clinically relevant pathology.    Radiology All images reviewed independently. Agree with radiology report at this time.   CT ABDOMEN PELVIS W CONTRAST  Result Date: 06/18/2022 CLINICAL DATA:  Flank pain for 1 day on left, initial encounter EXAM: CT ABDOMEN AND PELVIS WITH CONTRAST TECHNIQUE: Multidetector CT imaging of the abdomen and pelvis was performed using the standard protocol following bolus administration of intravenous contrast. RADIATION DOSE REDUCTION: This exam was performed according to the departmental dose-optimization program which includes automated exposure control, adjustment of the mA and/or kV according to patient size and/or use of iterative reconstruction technique. CONTRAST:  180m OMNIPAQUE IOHEXOL 300 MG/ML  SOLN COMPARISON:  02/26/2022 FINDINGS: Lower chest: No acute abnormality. Hepatobiliary: No focal liver abnormality is seen. No gallstones, gallbladder wall thickening, or biliary dilatation.  Pancreas: Unremarkable. No pancreatic ductal dilatation or surrounding inflammatory changes. Spleen: Normal in size without focal abnormality. Adrenals/Urinary Tract: Adrenal glands are within normal limits. Kidneys are well visualized bilaterally. Punctate calculi are noted bilaterally stable from the prior exam. Normal excretion is noted bilaterally. No obstructive changes are seen. The bladder is partially distended. No bladder stones are seen. Stomach/Bowel: Scattered diverticular changes noted without evidence of diverticulitis. The colon shows no inflammatory or obstructive changes. The appendix has been surgically removed. Small bowel and stomach are within normal limits. Vascular/Lymphatic: Aortic atherosclerosis. No enlarged abdominal or pelvic lymph nodes. Reproductive: Prostate is unremarkable. Other: No abdominal wall hernia or abnormality. No abdominopelvic ascites. Musculoskeletal: Degenerative changes of lumbar spine are noted. IMPRESSION: Stable punctate renal calculi bilaterally. Diverticulosis without diverticulitis. No acute abnormality to correspond with the given clinical history. Electronically Signed   By: MInez CatalinaM.D.   On: 06/18/2022 17:49    Final Reassessment and Plan:   Reevaluated the patient after 2 hours in the emergency room.  Symptoms completely resolved.  He has been able to tolerate p.o. intake. Had a prolonged conversation.  Uncertain etiology at this time.  Active evaluation grossly reassuring at this time.  Symptoms are resolved. Patient's history of present illness and physical exam are grossly reassuring.   Disposition:  I  have considered need for hospitalization, however, considering all of the above, I believe this patient is stable for discharge at this time.  Patient/family educated about specific return precautions for given chief complaint and symptoms.  Patient/family educated about follow-up with PCP.     Patient/family expressed understanding of  return precautions and need for follow-up. Patient spoken to regarding all imaging and laboratory results and appropriate follow up for these results. All education provided in verbal form with additional information in written form. Time was allowed for answering of patient questions. Patient discharged.    Emergency Department Medication Summary:   Medications  lactated ringers bolus 1,000 mL (1,000 mLs Intravenous New Bag/Given 06/18/22 1657)  morphine (PF) 4 MG/ML injection 8 mg (8 mg Intravenous Given 06/18/22 1656)  ondansetron (ZOFRAN) injection 4 mg (4 mg Intravenous Given 06/18/22 1652)  iohexol (OMNIPAQUE) 300 MG/ML solution 100 mL (100 mLs Intravenous Contrast Given 06/18/22 1723)     Clinical Impression:  1. Acute left-sided back pain, unspecified back location      Discharge   Final Clinical Impression(s) / ED Diagnoses Final diagnoses:  Acute left-sided back pain, unspecified back location    Rx / DC Orders ED Discharge Orders     None         Tretha Sciara, MD 06/18/22 [phone]

## 2022-06-18 NOTE — ED Notes (Signed)
Patient verbalizes understanding of discharge instructions. Opportunity for questioning and answers were provided. Patient discharged from ED.  °

## 2022-06-18 NOTE — ED Triage Notes (Signed)
Pt arrives to ED with c/o flank and abdominal pain that started today. He notes the pain started in his left flank but now is centered in his left lower abdomen.

## 2022-06-18 NOTE — ED Notes (Signed)
After medication pulse ox drop to 88%  Placed on O2 2L via [place]

## 2022-06-18 NOTE — ED Notes (Signed)
Pt currently unable to provide urine sample. Urinal at bedside and instructed to let us know when he uses it.

## 2022-06-19 ENCOUNTER — Other Ambulatory Visit (HOSPITAL_COMMUNITY): Payer: Self-pay

## 2022-06-20 ENCOUNTER — Other Ambulatory Visit (HOSPITAL_COMMUNITY): Payer: Self-pay

## 2022-06-24 DIAGNOSIS — Z86010 Personal history of colonic polyps: Secondary | ICD-10-CM | POA: Diagnosis not present

## 2022-06-24 DIAGNOSIS — K625 Hemorrhage of anus and rectum: Secondary | ICD-10-CM | POA: Diagnosis not present

## 2022-06-27 ENCOUNTER — Other Ambulatory Visit (HOSPITAL_COMMUNITY): Payer: Self-pay

## 2022-07-10 ENCOUNTER — Other Ambulatory Visit (HOSPITAL_COMMUNITY): Payer: Self-pay

## 2022-07-11 ENCOUNTER — Other Ambulatory Visit (HOSPITAL_COMMUNITY): Payer: Self-pay

## 2022-07-12 ENCOUNTER — Other Ambulatory Visit (HOSPITAL_COMMUNITY): Payer: Self-pay

## 2022-07-15 ENCOUNTER — Other Ambulatory Visit (HOSPITAL_COMMUNITY): Payer: Self-pay

## 2022-07-15 ENCOUNTER — Other Ambulatory Visit: Payer: Self-pay

## 2022-07-16 ENCOUNTER — Other Ambulatory Visit (HOSPITAL_COMMUNITY): Payer: Self-pay

## 2022-07-17 ENCOUNTER — Other Ambulatory Visit (HOSPITAL_COMMUNITY): Payer: Self-pay

## 2022-07-17 ENCOUNTER — Other Ambulatory Visit: Payer: Self-pay

## 2022-07-23 DIAGNOSIS — G4733 Obstructive sleep apnea (adult) (pediatric): Secondary | ICD-10-CM | POA: Diagnosis not present

## 2022-08-05 DIAGNOSIS — M1711 Unilateral primary osteoarthritis, right knee: Secondary | ICD-10-CM | POA: Diagnosis not present

## 2022-08-05 DIAGNOSIS — Z6841 Body Mass Index (BMI) 40.0 and over, adult: Secondary | ICD-10-CM | POA: Diagnosis not present

## 2022-08-06 ENCOUNTER — Other Ambulatory Visit (HOSPITAL_COMMUNITY): Payer: Self-pay

## 2022-08-09 DIAGNOSIS — G4733 Obstructive sleep apnea (adult) (pediatric): Secondary | ICD-10-CM | POA: Diagnosis not present

## 2022-08-14 ENCOUNTER — Other Ambulatory Visit (HOSPITAL_COMMUNITY): Payer: Self-pay

## 2022-08-21 ENCOUNTER — Encounter (HOSPITAL_BASED_OUTPATIENT_CLINIC_OR_DEPARTMENT_OTHER): Payer: Medicare HMO | Admitting: Cardiovascular Disease

## 2022-08-22 DIAGNOSIS — Z6841 Body Mass Index (BMI) 40.0 and over, adult: Secondary | ICD-10-CM | POA: Diagnosis not present

## 2022-08-22 DIAGNOSIS — Z1389 Encounter for screening for other disorder: Secondary | ICD-10-CM | POA: Diagnosis not present

## 2022-08-22 DIAGNOSIS — Z0000 Encounter for general adult medical examination without abnormal findings: Secondary | ICD-10-CM | POA: Diagnosis not present

## 2022-08-22 DIAGNOSIS — Z23 Encounter for immunization: Secondary | ICD-10-CM | POA: Diagnosis not present

## 2022-09-05 ENCOUNTER — Other Ambulatory Visit (HOSPITAL_COMMUNITY): Payer: Self-pay

## 2022-09-06 IMAGING — CT CT HEART MORP W/ CTA COR W/ SCORE W/ CA W/CM &/OR W/O CM
4 of 7 series · 8 of 20 positions shown, 9 images · IV contrast (APPLIED)
Comparison: None.
COMPARISON: None.

Addendum:
EXAM:
OVER-READ INTERPRETATION  CT CHEST

The following report is an over-read performed by radiologist Dr.
Aouichi Hadil [[email or web address]] on 02/29/2020. This
over-read does not include interpretation of cardiac or coronary
anatomy or pathology. The coronary calcium score/coronary CTA
interpretation by the cardiologist is attached.
CLINICAL DATA: 54M with hypertension, asthma, GERD, HIV and
atypical chest pain.
Cardiac/Coronary  CT
TECHNIQUE: The patient was scanned on a [employer] Force scanner.

[Series 7: best diast 71 % · axial · 0.39mm/px · z∈[-220,-182]mm · 2 of 293 slices shown, 3 images]
[im 98/293  vessel]
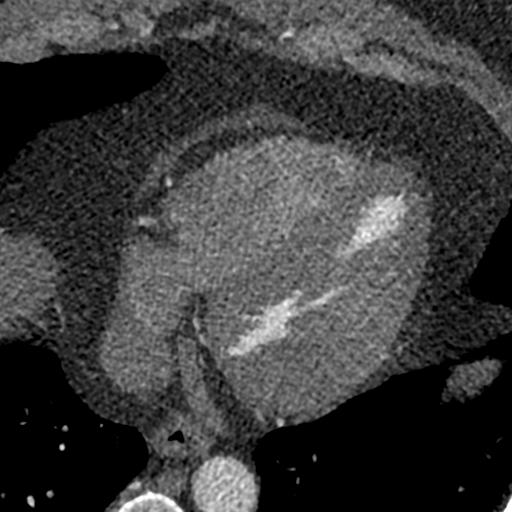
[im 98/293  lung]
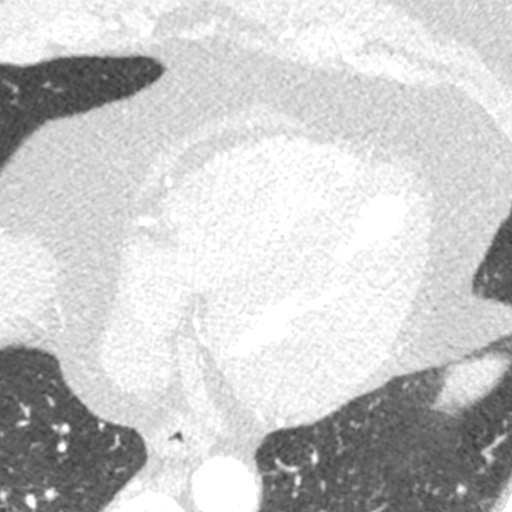
[im 195/293  vessel]
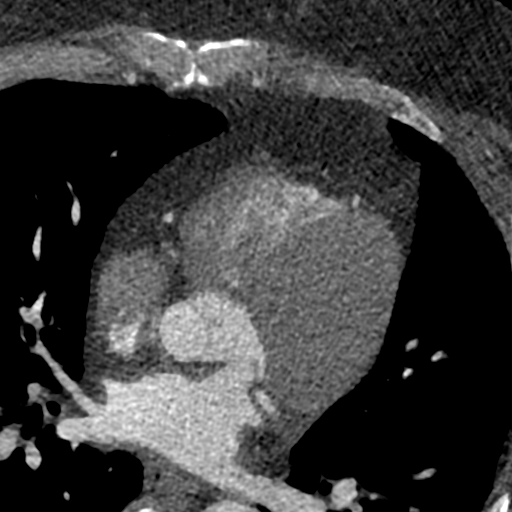

[Series 8: best syst 36 % · axial · 0.39mm/px · z∈[-220,-182]mm · 2 of 293 slices shown]
[im 98/293  vessel]
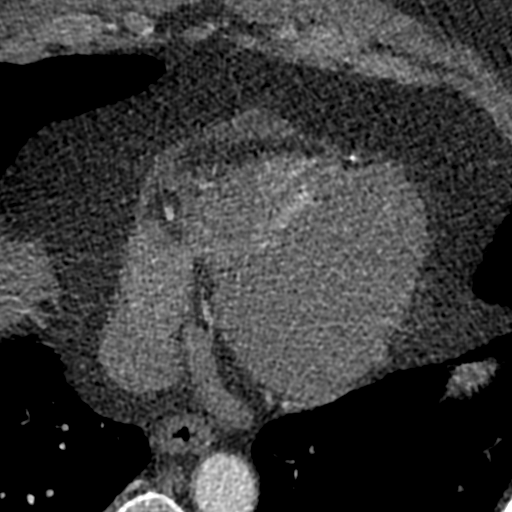
[im 195/293  vessel]
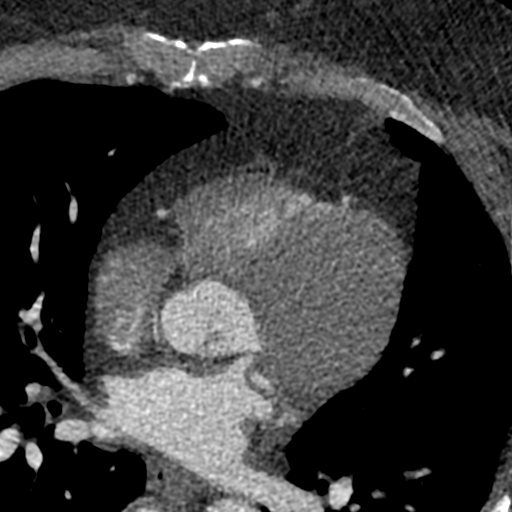

[Series 9: ts diast sharp 71 % · axial · 0.39mm/px · z∈[-220,-182]mm · 2 of 293 slices shown]
[im 98/293  lung]
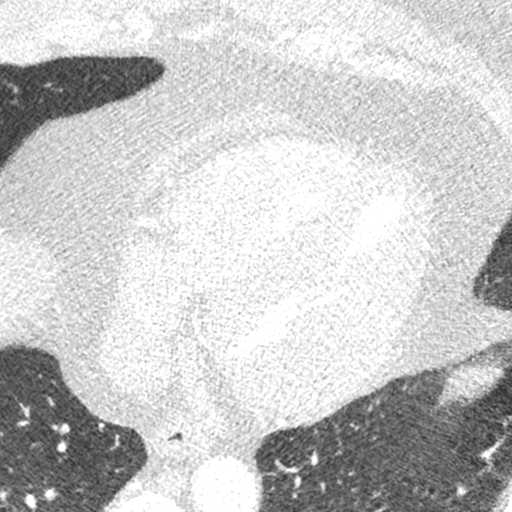
[im 195/293  lung]
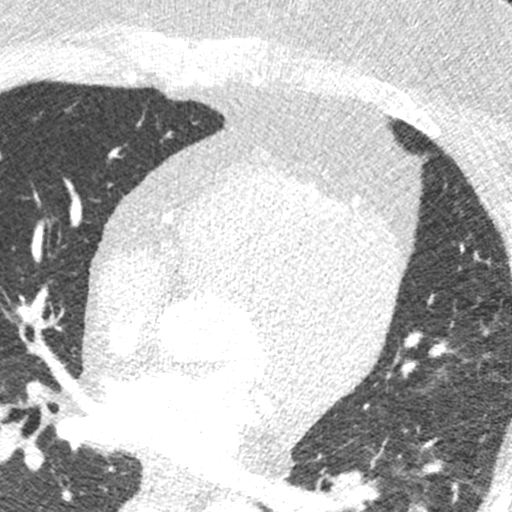

[Series 10: ts syst sharp 36 % · axial · 0.39mm/px · z∈[-220,-182]mm · 2 of 293 slices shown]
[im 98/293  lung]
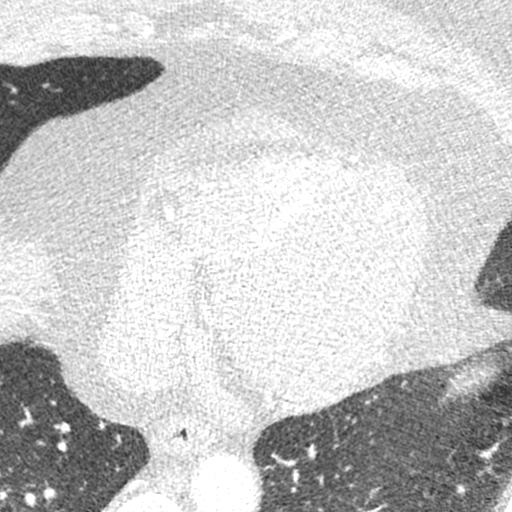
[im 195/293  lung]
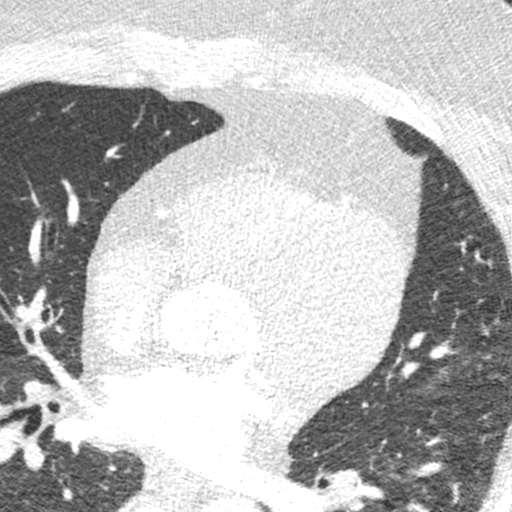

[8 of 20 positions shown; findings below may reference images not displayed]

FINDINGS: Within the visualized portions of the thorax there are no suspicious
appearing pulmonary nodules or masses, there is no acute
consolidative airspace disease, no pleural effusions, no
pneumothorax and no lymphadenopathy. Visualized portions of the
upper abdomen are unremarkable. There are no aggressive appearing
lytic or blastic lesions noted in the visualized portions of the
skeleton.
IMPRESSION: No significant incidental noncardiac findings are noted.
FINDINGS: A 120 kV prospective scan was triggered in the descending thoracic
aorta at 111 HU's. Axial non-contrast 3 mm slices were carried out
through the heart. The data set was analyzed on a dedicated work
station and scored using the Agatson method. Gantry rotation speed
was 250 msecs and collimation was .6 mm. No beta blockade and 0.8 mg
of sl NTG was given. The 3D data set was reconstructed in 5%
intervals of the 67-82 % of the R-R cycle. Diastolic phases were
analyzed on a dedicated work station using MPR, MIP and VRT modes.
The patient received 100 cc of contrast.

Aorta: Normal size. Mild calcification in the descending aorta. No
dissection.

Aortic Valve:  Trileaflet.  No calcifications.

Coronary Arteries:  Normal coronary origin.  Right dominance.

RCA is a large dominant artery that gives rise to PDA and PLVB.
There is minimal (<25%) calcified plaque in the mid RCA and mild
(25-49%) mixed plaque in the distal RCA.

Left main is a large artery that gives rise to LAD, RI, and LCX
arteries.

LAD is a large vessel that has minimal (<25%) calcified plaque in
the proximal LAD. Distal LAD has mixed plaque of uncertain severity,
likely obstructive. The vessel is 1.8 cm in this region. Image
quality is limited by body habitus. D1 has severe (>70%) soft plaque
proximally and moderate (50-69%) mixed plaque in the mid vessel.

RI is a tiny vessel with calcified plaque proximally.

LCX is a non-dominant artery that gives rise to one large OM1
branch. There is minimal (<25%) calcified plaque in the mid LCX. OM1
is a large vessel with scattered mild (25-49%) soft plaque.

Other findings:

Normal pulmonary vein drainage into the left atrium.

Normal let atrial appendage without a thrombus.

Normal size of the pulmonary artery.
IMPRESSION: 1. Coronary calcium score of 195. This was 94th percentile for age
and sex matched control.

2. Normal coronary origin with right dominance.

3. There is severe (>70%) stenosis in proximal D1. There is also
significant plaque in the distal LAD. However this region cannot be
well-visualized.

4. Study is limited by body habitus.

5. Consider cardiac catheterization for more definitive evaluation
of the coronaries and concern for significant stenosis in the distal
LAD, D1 and RI.

*** End of Addendum ***
EXAM:
OVER-READ INTERPRETATION  CT CHEST

The following report is an over-read performed by radiologist Dr.
Aouichi Hadil [REDACTED] on 02/29/2020. This
over-read does not include interpretation of cardiac or coronary
anatomy or pathology. The coronary calcium score/coronary CTA
interpretation by the cardiologist is attached.
FINDINGS: Within the visualized portions of the thorax there are no suspicious
appearing pulmonary nodules or masses, there is no acute
consolidative airspace disease, no pleural effusions, no
pneumothorax and no lymphadenopathy. Visualized portions of the
upper abdomen are unremarkable. There are no aggressive appearing
lytic or blastic lesions noted in the visualized portions of the
skeleton.
IMPRESSION: No significant incidental noncardiac findings are noted.

## 2022-09-09 ENCOUNTER — Other Ambulatory Visit (HOSPITAL_COMMUNITY): Payer: Self-pay

## 2022-09-11 ENCOUNTER — Other Ambulatory Visit (HOSPITAL_COMMUNITY): Payer: Self-pay

## 2022-09-20 DIAGNOSIS — M1711 Unilateral primary osteoarthritis, right knee: Secondary | ICD-10-CM | POA: Diagnosis not present

## 2022-09-20 DIAGNOSIS — Z6841 Body Mass Index (BMI) 40.0 and over, adult: Secondary | ICD-10-CM | POA: Diagnosis not present

## 2022-09-23 DIAGNOSIS — M170 Bilateral primary osteoarthritis of knee: Secondary | ICD-10-CM | POA: Diagnosis not present

## 2022-09-27 ENCOUNTER — Other Ambulatory Visit (HOSPITAL_COMMUNITY): Payer: Self-pay

## 2022-10-18 ENCOUNTER — Other Ambulatory Visit (HOSPITAL_COMMUNITY): Payer: Self-pay

## 2022-10-18 ENCOUNTER — Other Ambulatory Visit: Payer: Self-pay

## 2022-10-18 DIAGNOSIS — E782 Mixed hyperlipidemia: Secondary | ICD-10-CM | POA: Diagnosis not present

## 2022-10-18 DIAGNOSIS — E1142 Type 2 diabetes mellitus with diabetic polyneuropathy: Secondary | ICD-10-CM | POA: Diagnosis not present

## 2022-10-18 DIAGNOSIS — Z125 Encounter for screening for malignant neoplasm of prostate: Secondary | ICD-10-CM | POA: Diagnosis not present

## 2022-10-18 DIAGNOSIS — E785 Hyperlipidemia, unspecified: Secondary | ICD-10-CM | POA: Diagnosis not present

## 2022-10-18 DIAGNOSIS — Z21 Asymptomatic human immunodeficiency virus [HIV] infection status: Secondary | ICD-10-CM | POA: Diagnosis not present

## 2022-10-18 DIAGNOSIS — I700 Atherosclerosis of aorta: Secondary | ICD-10-CM | POA: Diagnosis not present

## 2022-11-06 ENCOUNTER — Other Ambulatory Visit: Payer: Self-pay

## 2022-11-11 ENCOUNTER — Other Ambulatory Visit (HOSPITAL_COMMUNITY): Payer: Self-pay

## 2022-11-13 ENCOUNTER — Other Ambulatory Visit (HOSPITAL_COMMUNITY): Payer: Self-pay

## 2022-12-05 ENCOUNTER — Other Ambulatory Visit (HOSPITAL_COMMUNITY): Payer: Self-pay

## 2022-12-05 ENCOUNTER — Other Ambulatory Visit: Payer: Self-pay | Admitting: Internal Medicine

## 2022-12-05 ENCOUNTER — Other Ambulatory Visit: Payer: Self-pay

## 2022-12-05 DIAGNOSIS — B20 Human immunodeficiency virus [HIV] disease: Secondary | ICD-10-CM

## 2022-12-05 MED ORDER — BIKTARVY 50-200-25 MG PO TABS
1.0000 | ORAL_TABLET | Freq: Every day | ORAL | 0 refills | Status: DC
  Filled 2022-12-05: qty 30, 30d supply, fill #0

## 2022-12-06 ENCOUNTER — Other Ambulatory Visit (HOSPITAL_COMMUNITY): Payer: Self-pay

## 2022-12-27 ENCOUNTER — Other Ambulatory Visit (HOSPITAL_COMMUNITY): Payer: Self-pay

## 2022-12-27 ENCOUNTER — Other Ambulatory Visit: Payer: Self-pay | Admitting: Internal Medicine

## 2022-12-27 DIAGNOSIS — B20 Human immunodeficiency virus [HIV] disease: Secondary | ICD-10-CM

## 2022-12-28 ENCOUNTER — Other Ambulatory Visit (HOSPITAL_COMMUNITY): Payer: Self-pay

## 2022-12-30 ENCOUNTER — Other Ambulatory Visit (HOSPITAL_COMMUNITY): Payer: Self-pay

## 2022-12-30 ENCOUNTER — Other Ambulatory Visit: Payer: Self-pay | Admitting: Internal Medicine

## 2022-12-30 DIAGNOSIS — B20 Human immunodeficiency virus [HIV] disease: Secondary | ICD-10-CM

## 2022-12-30 NOTE — Telephone Encounter (Signed)
Appt 8/13.

## 2022-12-30 NOTE — Telephone Encounter (Signed)
Discuss at 8/13 appt.   Sandie Ano, RN

## 2022-12-31 ENCOUNTER — Ambulatory Visit: Payer: Medicare HMO | Admitting: Internal Medicine

## 2023-01-01 ENCOUNTER — Other Ambulatory Visit: Payer: Self-pay

## 2023-01-01 ENCOUNTER — Other Ambulatory Visit (HOSPITAL_COMMUNITY): Payer: Self-pay

## 2023-01-01 DIAGNOSIS — M25562 Pain in left knee: Secondary | ICD-10-CM | POA: Diagnosis not present

## 2023-01-01 DIAGNOSIS — M25561 Pain in right knee: Secondary | ICD-10-CM | POA: Diagnosis not present

## 2023-01-01 DIAGNOSIS — M170 Bilateral primary osteoarthritis of knee: Secondary | ICD-10-CM | POA: Diagnosis not present

## 2023-01-01 MED ORDER — BIKTARVY 50-200-25 MG PO TABS
1.0000 | ORAL_TABLET | Freq: Every day | ORAL | 0 refills | Status: DC
  Filled 2023-01-01: qty 30, 30d supply, fill #0

## 2023-01-01 NOTE — Telephone Encounter (Signed)
Called patient and left vm to reschedule missed appt.

## 2023-01-03 ENCOUNTER — Other Ambulatory Visit (HOSPITAL_COMMUNITY): Payer: Self-pay

## 2023-01-08 DIAGNOSIS — M170 Bilateral primary osteoarthritis of knee: Secondary | ICD-10-CM | POA: Diagnosis not present

## 2023-01-08 NOTE — Progress Notes (Deleted)
Cardiology Clinic Note   Date: 01/08/2023 ID: Vincent Young, DOB 09/01/1965, MRN [id number]  Primary Cardiologist:  Reatha Harps, MD  Patient Profile    Vincent Young is a 57 y.o. male who presents to the clinic today for ***    Past medical history significant for: Nonobstructive CAD. Coronary CTA 02/29/2020: Coronary calcium score 195 (94th percentile).  Severe stenosis proximal D1.  Significant plaque distal LAD.  Calcified plaque RI.  Consider cardiac catheterization for further evaluation. LHC 03/15/2020: D1 45%.  Distal to apical LAD 50%.  Ramus 55%. Hypertension. Hyperlipidemia. Lipid panel 01/16/2022: LDL 53, HDL 45, TG 67, total 112. HIV. OSA.     History of Present Illness    Vincent Young was first evaluated by Dr. Flora Lipps on 01/31/2020 for chest pain at the request of Dr. Modesto Charon.  Patient underwent coronary CTA which showed calcium score 195, severe stenosis proximal D1, significant plaque distal LAD, calcified plaque RI.  He underwent LHC which showed nonobstructive CAD.  Valuated by Dr. Tresa Endo for sleep apnea and was found to have severe OSA.  It was recommended he start CPAP.  Patient was last seen in the office by Dr. Flora Lipps on 04/29/2022 for routine follow-up.  He had not yet received his CPAP machine.  He did report sharp chest discomfort occurring in the morning that did not sound like angina.  His BP was very elevated.  EKG was nonischemic.  He was tender to palpation.  It was suggested that he start on CPAP and get BP under better control and if symptoms persist would recommend cardiac PET/CT.  Today, patient ***  Nonobstructive CAD.  Per angiography October 2021. Patient *** Continue aspirin, amlodipine, atorvastatin, as needed SL NTG. Hypertension: BP today *** Patient denies headaches, dizziness or vision changes. Continue amlodipine, losartan. Hyperlipidemia.  LDL August 2023 53, at goal.  Continue atorvastatin.  Will get repeat lipid panel***   ROS:  All other systems reviewed and are otherwise negative except as noted in History of Present Illness.  Studies Reviewed       ***  Risk Assessment/Calculations    {Does this patient have ATRIAL FIBRILLATION?:[phone]} No BP recorded.  {Refresh Note OR Click here to enter BP  :1}***   STOP-Bang Score:  8  { Consider Dx Sleep Disordered Breathing or Sleep Apnea  ICD G47.33          :1}     Physical Exam    VS:  There were no vitals taken for this visit. , BMI There is no height or weight on file to calculate BMI.  GEN: Well nourished, well developed, in no acute distress. Neck: No JVD or carotid bruits. Cardiac: *** RRR. No murmurs. No rubs or gallops.   Respiratory:  Respirations regular and unlabored. Clear to auscultation without rales, wheezing or rhonchi. GI: Soft, nontender, nondistended. Extremities: Radials/DP/PT 2+ and equal bilaterally. No clubbing or cyanosis. No edema ***  Skin: Warm and dry, no rash. Neuro: Strength intact.  Assessment & Plan   ***  Disposition: ***     {Are you ordering a CV Procedure (e.g. stress test, cath, DCCV, TEE, etc)?   Press F2        :[id number]}   Signed, Etta Grandchild. Yogesh Cominsky, DNP, NP-C

## 2023-01-10 ENCOUNTER — Ambulatory Visit: Payer: Medicare HMO | Admitting: Student

## 2023-01-10 DIAGNOSIS — M1711 Unilateral primary osteoarthritis, right knee: Secondary | ICD-10-CM | POA: Diagnosis not present

## 2023-01-10 DIAGNOSIS — Z6841 Body Mass Index (BMI) 40.0 and over, adult: Secondary | ICD-10-CM | POA: Diagnosis not present

## 2023-01-14 ENCOUNTER — Other Ambulatory Visit: Payer: Self-pay | Admitting: Internal Medicine

## 2023-01-14 ENCOUNTER — Other Ambulatory Visit (HOSPITAL_COMMUNITY): Payer: Self-pay

## 2023-01-14 ENCOUNTER — Other Ambulatory Visit: Payer: Self-pay

## 2023-01-14 DIAGNOSIS — B20 Human immunodeficiency virus [HIV] disease: Secondary | ICD-10-CM

## 2023-01-14 MED ORDER — VALACYCLOVIR HCL 500 MG PO TABS
1000.0000 mg | ORAL_TABLET | Freq: Every day | ORAL | 2 refills | Status: DC
  Filled 2023-01-14: qty 60, 30d supply, fill #0

## 2023-01-23 DIAGNOSIS — M170 Bilateral primary osteoarthritis of knee: Secondary | ICD-10-CM | POA: Diagnosis not present

## 2023-01-24 ENCOUNTER — Encounter: Payer: Self-pay | Admitting: Internal Medicine

## 2023-01-24 ENCOUNTER — Ambulatory Visit: Payer: Medicare HMO | Admitting: Internal Medicine

## 2023-01-24 ENCOUNTER — Other Ambulatory Visit: Payer: Self-pay

## 2023-01-24 VITALS — BP 143/86 | HR 88 | Temp 98.7°F | Resp 16 | Wt 391.0 lb

## 2023-01-24 DIAGNOSIS — B20 Human immunodeficiency virus [HIV] disease: Secondary | ICD-10-CM

## 2023-01-24 DIAGNOSIS — B009 Herpesviral infection, unspecified: Secondary | ICD-10-CM | POA: Diagnosis not present

## 2023-01-24 DIAGNOSIS — Z113 Encounter for screening for infections with a predominantly sexual mode of transmission: Secondary | ICD-10-CM

## 2023-01-24 DIAGNOSIS — E669 Obesity, unspecified: Secondary | ICD-10-CM

## 2023-01-24 DIAGNOSIS — Z79899 Other long term (current) drug therapy: Secondary | ICD-10-CM

## 2023-01-24 DIAGNOSIS — Z5181 Encounter for therapeutic drug level monitoring: Secondary | ICD-10-CM

## 2023-01-24 DIAGNOSIS — Z23 Encounter for immunization: Secondary | ICD-10-CM

## 2023-01-24 MED ORDER — BIKTARVY 50-200-25 MG PO TABS: 1.0000 | ORAL_TABLET | Freq: Every day | ORAL | 11 refills | Status: DC

## 2023-01-24 MED ORDER — VALACYCLOVIR HCL 500 MG PO TABS: 1000.0000 mg | ORAL_TABLET | Freq: Every day | ORAL | 11 refills | Status: DC

## 2023-01-24 NOTE — Assessment & Plan Note (Addendum)
He continues to do well on Biktarvy and no changes indicated, pending lab results.  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  Otherwise no concerns and he will rtc in 6 months.

## 2023-01-24 NOTE — Assessment & Plan Note (Signed)
He has declined a flu shot.

## 2023-01-24 NOTE — Progress Notes (Signed)
   Subjective:    Patient ID: Vincent Young, male    DOB: [birth year], 57 y.o.   MRN: [id number]  HPI Vincent Young is here for follow up of HIV He continues on [place] and no new issues.  No issues with getting or taking his medication.  No missed doses.  Last seen about 1 year ago.  Is trying to lose weight.  Looking to get stem cell injections for his knees when he has the money.     Review of Systems  Constitutional:  Negative for fatigue.  Gastrointestinal:  Negative for diarrhea.  Skin:  Negative for rash.       Objective:   Physical Exam Eyes:     General: No scleral icterus. Pulmonary:     Effort: Pulmonary effort is normal.  Neurological:     Mental Status: He is alert.   SH: no tobacco        Assessment & Plan:

## 2023-01-24 NOTE — Assessment & Plan Note (Signed)
Will check his cbc, cmp

## 2023-01-24 NOTE — Assessment & Plan Note (Signed)
Will screen 

## 2023-01-24 NOTE — Assessment & Plan Note (Signed)
Will refill valacyclovir.

## 2023-01-24 NOTE — Assessment & Plan Note (Signed)
Discussed weight loss efforts.

## 2023-01-27 ENCOUNTER — Other Ambulatory Visit (HOSPITAL_COMMUNITY): Payer: Self-pay

## 2023-01-27 LAB — CBC WITH DIFFERENTIAL/PLATELET
Absolute Monocytes: 745 {cells}/uL (ref 200–950)
Basophils Absolute: 8 {cells}/uL (ref 0–200)
Basophils Relative: 0.1 %
Eosinophils Absolute: 292 {cells}/uL (ref 15–500)
Eosinophils Relative: 3.6 %
HCT: 37.5 % — ABNORMAL LOW (ref 38.5–50.0)
Hemoglobin: 12.5 g/dL — ABNORMAL LOW (ref 13.2–17.1)
Lymphs Abs: 3710 {cells}/uL (ref 850–3900)
MCH: 31.3 pg (ref 27.0–33.0)
MCHC: 33.3 g/dL (ref 32.0–36.0)
MCV: 93.8 fL (ref 80.0–100.0)
MPV: 9.1 fL (ref 7.5–12.5)
Monocytes Relative: 9.2 %
Neutro Abs: 3345 {cells}/uL (ref 1500–7800)
Neutrophils Relative %: 41.3 %
Platelets: 304 10*3/uL (ref 140–400)
RBC: 4 10*6/uL — ABNORMAL LOW (ref 4.20–5.80)
RDW: 13.7 % (ref 11.0–15.0)
Total Lymphocyte: 45.8 %
WBC: 8.1 10*3/uL (ref 3.8–10.8)

## 2023-01-27 LAB — COMPLETE METABOLIC PANEL WITH GFR
AG Ratio: 1.1 (calc) (ref 1.0–2.5)
ALT: 15 U/L (ref 9–46)
AST: 14 U/L (ref 10–35)
Albumin: 4 g/dL (ref 3.6–5.1)
Alkaline phosphatase (APISO): 119 U/L (ref 35–144)
BUN: 17 mg/dL (ref 7–25)
CO2: 26 mmol/L (ref 20–32)
Calcium: 9.5 mg/dL (ref 8.6–10.3)
Chloride: 107 mmol/L (ref 98–110)
Creat: 1.27 mg/dL (ref 0.70–1.30)
Globulin: 3.5 g/dL (ref 1.9–3.7)
Glucose, Bld: 92 mg/dL (ref 65–99)
Potassium: 4.8 mmol/L (ref 3.5–5.3)
Sodium: 142 mmol/L (ref 135–146)
Total Bilirubin: 0.4 mg/dL (ref 0.2–1.2)
Total Protein: 7.5 g/dL (ref 6.1–8.1)
eGFR: 65 mL/min/{1.73_m2} (ref >=60)

## 2023-01-27 LAB — LIPID PANEL
Cholesterol: 152 mg/dL (ref <200)
HDL: 47 mg/dL (ref >=40)
LDL Cholesterol (Calc): 86 mg/dL
Non-HDL Cholesterol (Calc): 105 mg/dL (ref <130)
Total CHOL/HDL Ratio: 3.2 (calc) (ref <5.0)
Triglycerides: 97 mg/dL (ref <150)

## 2023-01-27 LAB — T-HELPER CELLS (CD4) COUNT (NOT AT ARMC)
Absolute CD4: 946 {cells}/uL (ref 490–1740)
CD4 T Helper %: 23 % — ABNORMAL LOW (ref 30–61)
Total lymphocyte count: 4128 {cells}/uL — ABNORMAL HIGH (ref 850–3900)

## 2023-01-27 LAB — HIV-1 RNA QUANT-NO REFLEX-BLD
HIV 1 RNA Quant: 30 {copies}/mL — ABNORMAL HIGH
HIV-1 RNA Quant, Log: 1.47 {Log_copies}/mL — ABNORMAL HIGH

## 2023-01-27 LAB — RPR: RPR Ser Ql: NONREACTIVE

## 2023-01-27 LAB — C. TRACHOMATIS/N. GONORRHOEAE RNA
C. trachomatis RNA, TMA: NOT DETECTED
N. gonorrhoeae RNA, TMA: NOT DETECTED

## 2023-01-30 NOTE — Progress Notes (Deleted)
   Cardiology Clinic Note   Date: 01/30/2023 ID: Laurey Morale, DOB [birth year], MRN [id number]  Primary Cardiologist:  Reatha Harps, MD  Patient Profile    Vincent Young is a 57 y.o. male who presents to the clinic today for ***    Past medical history significant for: Nonobstructive CAD. Coronary CTA 02/29/2020: Coronary calcium score 195 (94th percentile).  Severe stenosis proximal D1.  Significant plaque distal LAD.  Calcified plaque RI.  Consider cardiac catheterization for further evaluation. LHC 03/15/2020: D1 45%.  Distal to apical LAD 50%.  Ramus 55%. Hypertension. Hyperlipidemia. Lipid panel 01/16/2022: LDL 53, HDL 45, TG 67, total 112. HIV. OSA.     History of Present Illness    Vincent Young was first evaluated by Dr. Flora Lipps on 01/31/2020 for chest pain at the request of Dr. Modesto Charon.  Patient underwent coronary CTA which showed calcium score 195, severe stenosis proximal D1, significant plaque distal LAD, calcified plaque RI.  He underwent LHC which showed nonobstructive CAD.  Valuated by Dr. Tresa Endo for sleep apnea and was found to have severe OSA.  It was recommended he start CPAP.  Patient was last seen in the office by Dr. Flora Lipps on 04/29/2022 for routine follow-up.  He had not yet received his CPAP machine.  He did report sharp chest discomfort occurring in the morning that did not sound like angina.  His BP was very elevated.  EKG was nonischemic.  He was tender to palpation.  It was suggested that he start on CPAP and get BP under better control and if symptoms persist would recommend cardiac PET/CT.  Today, patient ***  Nonobstructive CAD.  Per angiography October 2021. Patient *** Continue aspirin, amlodipine, atorvastatin, as needed SL NTG. Hypertension: BP today *** Patient denies headaches, dizziness or vision changes. Continue amlodipine, losartan. Hyperlipidemia.  LDL August 2023 53, at goal.  Continue atorvastatin.  Will get repeat lipid panel***   ROS:  All other systems reviewed and are otherwise negative except as noted in History of Present Illness.  Studies Reviewed       ***  Risk Assessment/Calculations    {Does this patient have ATRIAL FIBRILLATION?:[phone]} No BP recorded.  {Refresh Note OR Click here to enter BP  :1}***   STOP-Bang Score:  8  { Consider Dx Sleep Disordered Breathing or Sleep Apnea  ICD G47.33          :1}     Physical Exam    VS:  There were no vitals taken for this visit. , BMI There is no height or weight on file to calculate BMI.  GEN: Well nourished, well developed, in no acute distress. Neck: No JVD or carotid bruits. Cardiac: *** RRR. No murmurs. No rubs or gallops.   Respiratory:  Respirations regular and unlabored. Clear to auscultation without rales, wheezing or rhonchi. GI: Soft, nontender, nondistended. Extremities: Radials/DP/PT 2+ and equal bilaterally. No clubbing or cyanosis. No edema ***  Skin: Warm and dry, no rash. Neuro: Strength intact.  Assessment & Plan   ***  Disposition: ***     {Are you ordering a CV Procedure (e.g. stress test, cath, DCCV, TEE, etc)?   Press F2        :[id number]}   Signed, Etta Grandchild. Eloyce Bultman, DNP, NP-C

## 2023-01-31 ENCOUNTER — Ambulatory Visit: Payer: Medicare HMO | Attending: Student | Admitting: Student

## 2023-02-13 DIAGNOSIS — M25562 Pain in left knee: Secondary | ICD-10-CM | POA: Diagnosis not present

## 2023-02-13 DIAGNOSIS — M25561 Pain in right knee: Secondary | ICD-10-CM | POA: Diagnosis not present

## 2023-03-02 ENCOUNTER — Other Ambulatory Visit: Payer: Self-pay

## 2023-03-02 ENCOUNTER — Encounter (HOSPITAL_BASED_OUTPATIENT_CLINIC_OR_DEPARTMENT_OTHER): Payer: Self-pay | Admitting: Emergency Medicine

## 2023-03-02 ENCOUNTER — Emergency Department (HOSPITAL_BASED_OUTPATIENT_CLINIC_OR_DEPARTMENT_OTHER)
Admission: EM | Admit: 2023-03-02 | Discharge: 2023-03-02 | Disposition: A | Discharge status: Home / Self Care | Payer: Medicare HMO | Attending: Emergency Medicine | Admitting: Emergency Medicine

## 2023-03-02 ENCOUNTER — Emergency Department (HOSPITAL_BASED_OUTPATIENT_CLINIC_OR_DEPARTMENT_OTHER): Payer: Medicare HMO | Admitting: Radiology

## 2023-03-02 DIAGNOSIS — Z7984 Long term (current) use of oral hypoglycemic drugs: Secondary | ICD-10-CM | POA: Diagnosis not present

## 2023-03-02 DIAGNOSIS — Z79899 Other long term (current) drug therapy: Secondary | ICD-10-CM | POA: Diagnosis not present

## 2023-03-02 DIAGNOSIS — R0981 Nasal congestion: Secondary | ICD-10-CM | POA: Diagnosis not present

## 2023-03-02 DIAGNOSIS — I10 Essential (primary) hypertension: Secondary | ICD-10-CM | POA: Insufficient documentation

## 2023-03-02 DIAGNOSIS — R051 Acute cough: Secondary | ICD-10-CM | POA: Diagnosis not present

## 2023-03-02 DIAGNOSIS — J45909 Unspecified asthma, uncomplicated: Secondary | ICD-10-CM | POA: Diagnosis not present

## 2023-03-02 DIAGNOSIS — E119 Type 2 diabetes mellitus without complications: Secondary | ICD-10-CM | POA: Diagnosis not present

## 2023-03-02 DIAGNOSIS — R0600 Dyspnea, unspecified: Secondary | ICD-10-CM

## 2023-03-02 DIAGNOSIS — J45901 Unspecified asthma with (acute) exacerbation: Secondary | ICD-10-CM | POA: Diagnosis not present

## 2023-03-02 DIAGNOSIS — Z20822 Contact with and (suspected) exposure to covid-19: Secondary | ICD-10-CM | POA: Diagnosis not present

## 2023-03-02 DIAGNOSIS — Z87891 Personal history of nicotine dependence: Secondary | ICD-10-CM | POA: Insufficient documentation

## 2023-03-02 DIAGNOSIS — J3489 Other specified disorders of nose and nasal sinuses: Secondary | ICD-10-CM | POA: Diagnosis not present

## 2023-03-02 DIAGNOSIS — Z7952 Long term (current) use of systemic steroids: Secondary | ICD-10-CM | POA: Diagnosis not present

## 2023-03-02 DIAGNOSIS — R0602 Shortness of breath: Secondary | ICD-10-CM | POA: Diagnosis not present

## 2023-03-02 DIAGNOSIS — I2510 Atherosclerotic heart disease of native coronary artery without angina pectoris: Secondary | ICD-10-CM | POA: Diagnosis not present

## 2023-03-02 DIAGNOSIS — R519 Headache, unspecified: Secondary | ICD-10-CM | POA: Diagnosis not present

## 2023-03-02 DIAGNOSIS — B20 Human immunodeficiency virus [HIV] disease: Secondary | ICD-10-CM | POA: Insufficient documentation

## 2023-03-02 LAB — COMPREHENSIVE METABOLIC PANEL
ALT: 13 U/L (ref 0–44)
AST: 16 U/L (ref 15–41)
Albumin: 3.7 g/dL (ref 3.5–5.0)
Alkaline Phosphatase: 93 U/L (ref 38–126)
Anion gap: 8 (ref 5–15)
BUN: 10 mg/dL (ref 6–20)
CO2: 27 mmol/L (ref 22–32)
Calcium: 8.9 mg/dL (ref 8.9–10.3)
Chloride: 105 mmol/L (ref 98–111)
Creatinine, Ser: 1.05 mg/dL (ref 0.61–1.24)
GFR, Estimated: >60 mL/min (ref >60)
Glucose, Bld: 96 mg/dL (ref 70–99)
Potassium: 4.4 mmol/L (ref 3.5–5.1)
Sodium: 140 mmol/L (ref 135–145)
Total Bilirubin: 0.4 mg/dL (ref 0.3–1.2)
Total Protein: 7.2 g/dL (ref 6.5–8.1)

## 2023-03-02 LAB — URINALYSIS, ROUTINE W REFLEX MICROSCOPIC
Bacteria, UA: NONE SEEN
Bilirubin Urine: NEGATIVE
Glucose, UA: NEGATIVE mg/dL
Hgb urine dipstick: NEGATIVE
Ketones, ur: NEGATIVE mg/dL
Leukocytes,Ua: NEGATIVE
Nitrite: NEGATIVE
Protein, ur: 30 mg/dL — AB
Specific Gravity, Urine: 1.023 (ref 1.005–1.030)
pH: 5.5 (ref 5.0–8.0)

## 2023-03-02 LAB — CBC
HCT: 36.1 % — ABNORMAL LOW (ref 39.0–52.0)
Hemoglobin: 11.9 g/dL — ABNORMAL LOW (ref 13.0–17.0)
MCH: 31 pg (ref 26.0–34.0)
MCHC: 33 g/dL (ref 30.0–36.0)
MCV: 94 fL (ref 80.0–100.0)
Platelets: 258 10*3/uL (ref 150–400)
RBC: 3.84 MIL/uL — ABNORMAL LOW (ref 4.22–5.81)
RDW: 13.9 % (ref 11.5–15.5)
WBC: 6.3 10*3/uL (ref 4.0–10.5)
nRBC: 0 % (ref 0.0–0.2)

## 2023-03-02 LAB — TROPONIN I (HIGH SENSITIVITY)
Troponin I (High Sensitivity): 4 ng/L (ref <18)
Troponin I (High Sensitivity): 3 ng/L (ref <18)

## 2023-03-02 LAB — SARS CORONAVIRUS 2 BY RT PCR: SARS Coronavirus 2 by RT PCR: NEGATIVE

## 2023-03-02 LAB — BRAIN NATRIURETIC PEPTIDE: B Natriuretic Peptide: 55.7 pg/mL (ref 0.0–100.0)

## 2023-03-02 MED ORDER — ALBUTEROL SULFATE HFA 108 (90 BASE) MCG/ACT IN AERS: 2.0000 | INHALATION_SPRAY | RESPIRATORY_TRACT | Status: DC | PRN

## 2023-03-02 MED ORDER — CEFDINIR 300 MG PO CAPS: 300.0000 mg | ORAL_CAPSULE | Freq: Two times a day (BID) | ORAL | 0 refills | Status: DC

## 2023-03-02 MED ORDER — PREDNISONE 20 MG PO TABS: 40.0000 mg | ORAL_TABLET | Freq: Every day | ORAL | 0 refills | Status: AC

## 2023-03-02 MED ORDER — IPRATROPIUM-ALBUTEROL 0.5-2.5 (3) MG/3ML IN SOLN
3.0000 mL | Freq: Once | RESPIRATORY_TRACT | Status: AC
  Administered 2023-03-02: 3 mL via RESPIRATORY_TRACT
  Filled 2023-03-02: qty 3

## 2023-03-02 MED ORDER — ALBUTEROL SULFATE HFA 108 (90 BASE) MCG/ACT IN AERS: 1.0000 | INHALATION_SPRAY | Freq: Four times a day (QID) | RESPIRATORY_TRACT | 0 refills | Status: DC | PRN

## 2023-03-02 MED ORDER — BENZONATATE 100 MG PO CAPS: 100.0000 mg | ORAL_CAPSULE | Freq: Three times a day (TID) | ORAL | 0 refills | Status: DC | PRN

## 2023-03-02 MED ORDER — ALBUTEROL SULFATE (2.5 MG/3ML) 0.083% IN NEBU
2.5000 mg | INHALATION_SOLUTION | Freq: Once | RESPIRATORY_TRACT | Status: AC
  Administered 2023-03-02: 2.5 mg via RESPIRATORY_TRACT
  Filled 2023-03-02: qty 3

## 2023-03-02 NOTE — ED Triage Notes (Signed)
Headache, sinus pressure, body aches sore throat for 2 days,(sinus problems 2 weeks and has now progressed). No fevers

## 2023-03-02 NOTE — Discharge Instructions (Addendum)
As discussed, workup today overall reassuring.  Chest x-ray did not show signs of pneumonia or other abnormality.  Chest x-ray also did not show any evidence of fluid overload concerning for heart failure exacerbation.  Your heart enzymes were normal; does not look like you are having a heart attack.  Given wheeze on exam as well as improvement with breathing treatments on the ED, will treat with continued use of albuterol as well as short course of prednisone for treatment of asthma exacerbation.  Given the duration of time for the been having sinus pressure, we will place you on antibiotics for treatment of bacterial sinusitis.  Also will send in cough suppressant to use as needed.  Recommend additionally using allergy medicine such as Zyrtec/Claritin/Allegra as well as nasal steroid spray such as Flonase/Nasacort for treatment of your symptoms.  Recommend close follow-up with primary care for reassessment.  Please do not hesitate to return to emergency department if the worrisome signs and symptoms we discussed become apparent.

## 2023-03-02 NOTE — ED Triage Notes (Signed)
Pt also reports short of breath, especially when lying down. He does have rescue inhaler, but not helping much. No fevers

## 2023-03-02 NOTE — ED Provider Notes (Signed)
[place] EMERGENCY DEPARTMENT AT [facility] Provider Note   CSN: [id number] Arrival date & time: 03/02/23  1126     History  Chief Complaint  Patient presents with   Shortness of Breath    Vincent Young is a 57 y.o. male.   Shortness of Breath   57 year old male presents emergency department with complaints of cough, nasal congestion, sinus pressure, shortness of breath.  Patient states that he has been ill for the past 2 weeks or so.  Reports 2 weeks ago, developing dry cough, body aches, sinus pressure as well as nasal congestion.  Reports over the past couple of days, developing shortness of breath with exertion as well as with lying flat.  Reports anterior chest pain associated with bouts of coughing but not experienced otherwise.  Similarly reports bilateral lateral abdominal pain isolated to coughing episodes and not experienced otherwise.  Denies any fever, nausea, vomiting, urinary symptoms, change in bowel habits.  Past medical history significant for OSA, CAD with cardiac cath from 10/21 with LAD tapers to 50% stenosis and D1 roughly 45% eccentric lesion, diabetes mellitus, hypertension, obesity, GERD, asthma, HIV with most recent CD4 T-helper cell percentage of 23 and RNA quant of 30 and absolute CD4 count of 946  Home Medications Prior to Admission medications   Medication Sig Start Date End Date Taking? Authorizing Provider  albuterol (VENTOLIN HFA) 108 (90 Base) MCG/ACT inhaler Inhale 1-2 puffs into the lungs every 6 (six) hours as needed for wheezing or shortness of breath. 03/02/23  Yes Sherian Maroon A, PA  benzonatate (TESSALON) 100 MG capsule Take 1 capsule (100 mg total) by mouth 3 (three) times daily as needed for cough. 03/02/23  Yes Sherian Maroon A, PA  cefdinir (OMNICEF) 300 MG capsule Take 1 capsule (300 mg total) by mouth 2 (two) times daily. 03/02/23  Yes Sherian Maroon A, PA  predniSONE (DELTASONE) 20 MG tablet Take 2 tablets (40 mg total)  by mouth daily with breakfast for 6 days. 03/02/23 03/08/23 Yes Sherian Maroon A, PA  amLODipine (NORVASC) 10 MG tablet Take 1 tablet (10 mg total) by mouth daily. Patient not taking: Reported on 01/24/2023 04/29/22   Sande Rives, MD  aspirin EC 81 MG tablet Take 1 tablet (81 mg total) by mouth daily. Swallow whole. Patient not taking: Reported on 01/24/2023 03/01/20   Sande Rives, MD  atorvastatin (LIPITOR) 80 MG tablet Take 1 tablet (80 mg total) by mouth daily. 02/25/22   Sande Rives, MD  bictegravir-emtricitabine-tenofovir AF (BIKTARVY) 50-200-25 MG TABS tablet TAKE 1 TABLET BY MOUTH DAILY. 01/24/23 01/24/24  Gardiner Barefoot, MD  fluticasone (FLONASE) 50 MCG/ACT nasal spray Place 2 sprays into both nostrils daily as needed for allergies.  06/01/18   [provider]  losartan (COZAAR) 100 MG tablet Take 1 tablet (100 mg total) by mouth daily. 04/29/22   O'NealRonnald Ramp, MD  meloxicam (MOBIC) 15 MG tablet Take 15 mg by mouth daily. 10/26/19   [provider]  metFORMIN (GLUCOPHAGE) 500 MG tablet TAKE 1 TABLET BY MOUTH EVERY DAY WITH A MEAL FOR 90 DAYS for 90    [provider]  nitroGLYCERIN (NITROSTAT) 0.4 MG SL tablet Place 1 tablet (0.4 mg total) under the tongue every 5 (five) minutes as needed. 04/17/22 07/16/22  O'NealRonnald Ramp, MD  oxyCODONE-acetaminophen (PERCOCET) 10-325 MG tablet Take 1 tablet twice a day by oral route for 30 days. 01/10/23   [provider]  tiZANidine (ZANAFLEX) 2 MG  tablet Take 2 mg by mouth at bedtime. 01/14/23   [provider]  valACYclovir (VALTREX) 500 MG tablet Take 2 tablets (1,000 mg total) by mouth daily. 01/24/23   Gardiner Barefoot, MD      Allergies    Black walnut pollen allergy skin test, Shellfish allergy, Other, and Penicillins    Review of Systems   Review of Systems  Respiratory:  Positive for shortness of breath.   All other systems reviewed and are negative.   Physical  Exam Updated Vital Signs BP 132/74   Pulse 82   Temp 98.9 F (37.2 C) (Oral)   Resp 18   Wt (!) 176.9 kg   SpO2 100%   BMI 54.39 kg/m  Physical Exam Vitals and nursing note reviewed.  Constitutional:      General: He is not in acute distress.    Appearance: He is well-developed.  HENT:     Head: Normocephalic and atraumatic.     Comments: Maxillary sinus tenderness to palpation bilaterally.    Right Ear: Tympanic membrane normal.     Left Ear: Tympanic membrane normal.     Nose: Congestion present.     Mouth/Throat:     Mouth: Mucous membranes are moist.     Pharynx: Oropharynx is clear.  Eyes:     Conjunctiva/sclera: Conjunctivae normal.  Cardiovascular:     Rate and Rhythm: Normal rate and regular rhythm.     Heart sounds: No murmur heard. Pulmonary:     Effort: Pulmonary effort is normal. No respiratory distress.     Breath sounds: Wheezing present.     Comments: Wheeze auscultated bilateral lung fields. Abdominal:     Palpations: Abdomen is soft.     Tenderness: There is no abdominal tenderness.  Musculoskeletal:     Cervical back: Neck supple.     Right lower leg: Edema present.     Left lower leg: Edema present.     Comments: 1-2+ lower extremity edema  Skin:    General: Skin is warm and dry.     Capillary Refill: Capillary refill takes less than 2 seconds.  Neurological:     Mental Status: He is alert.  Psychiatric:        Mood and Affect: Mood normal.     ED Results / Procedures / Treatments   Labs (all labs ordered are listed, but only abnormal results are displayed) Labs Reviewed  CBC - Abnormal; Notable for the following components:      Result Value   RBC 3.84 (*)    Hemoglobin 11.9 (*)    HCT 36.1 (*)    All other components within normal limits  URINALYSIS, ROUTINE W REFLEX MICROSCOPIC - Abnormal; Notable for the following components:   Protein, ur 30 (*)    All other components within normal limits  SARS CORONAVIRUS 2 BY RT PCR   COMPREHENSIVE METABOLIC PANEL  BRAIN NATRIURETIC PEPTIDE  TROPONIN I (HIGH SENSITIVITY)  TROPONIN I (HIGH SENSITIVITY)    EKG EKG Interpretation Date/Time:  Sunday March 02 2023 11:59:22 EDT Ventricular Rate:  79 PR Interval:  161 QRS Duration:  100 QT Interval:  374 QTC Calculation: 429 R Axis:   72  Text Interpretation: Sinus rhythm Confirmed by Fulton Reek [phone]) on 03/02/2023 12:15:22 PM  Radiology DG Chest 2 View  Result Date: 03/02/2023 CLINICAL DATA:  Short of breath, headache EXAM: CHEST - 2 VIEW COMPARISON:  11/17/2018 FINDINGS: Lungs are clear. Heart size and mediastinal contours are within normal  limits. No effusion. Visualized bones unremarkable. IMPRESSION: No acute cardiopulmonary disease. Electronically Signed   By: Corlis Leak M.D.   On: 03/02/2023 13:42    Procedures Procedures    Medications Ordered in ED Medications  albuterol (VENTOLIN HFA) 108 (90 Base) MCG/ACT inhaler 2 puff (has no administration in time range)  ipratropium-albuterol (DUONEB) 0.5-2.5 (3) MG/3ML nebulizer solution 3 mL (3 mLs Nebulization Given 03/02/23 1235)  albuterol (PROVENTIL) (2.5 MG/3ML) 0.083% nebulizer solution 2.5 mg (2.5 mg Nebulization Given 03/02/23 1416)    ED Course/ Medical Decision Making/ A&P                                 Medical Decision Making Amount and/or Complexity of Data Reviewed Labs: ordered. Radiology: ordered.  Risk Prescription drug management.   This patient presents to the ED for concern of cough, shortness of breath, nasal congestion, this involves an extensive number of treatment options, and is a complaint that carries with it a high risk of complications and morbidity.  The differential diagnosis includes COVID, RSV, influenza, myocarditis, CHF, ACS, PE, asthma, pneumothorax, anemia, other   Co morbidities that complicate the patient evaluation  See HPI   Additional history obtained:  Additional history obtained from  EMR External records from outside source obtained and reviewed including hospital records   Lab Tests:  I Ordered, and personally interpreted labs.  The pertinent results include:   no leukocytosis.  Evidence of anemia with a hemoglobin of 11.9.  Placed within range.  No electrolyte abnormalities.  No transaminitis.  No renal dysfunction.  BNP negative.  Initial troponin of 4 with repeat 3.  COVID-negative.   Imaging Studies ordered:  I ordered imaging studies including chest x-ray I independently visualized and interpreted imaging which showed no acute cardiopulmonary maladies I agree with the radiologist interpretation   Cardiac Monitoring: / EKG:  The patient was maintained on a cardiac monitor.  I personally viewed and interpreted the cardiac monitored which showed an underlying rhythm of: Sinus rhythm   Consultations Obtained:  N/a   Problem List / ED Course / Critical interventions / Medication management  Cough, nasal congestion, sinus pressure, shortness of breath I ordered medication including albuterol   Reevaluation of the patient after these medicines showed that the patient improved I have reviewed the patients home medicines and have made adjustments as needed   Social Determinants of Health:  Former cigarette use.  Denies illicit drug use.   Test / Admission - Considered:  Cough, nasal congestion, sinus pressure, asthma exacerbation, shortness of breath Vitals signs within normal range and stable throughout visit. Laboratory/imaging studies significant for: See above 57 year old male presents emergency department with complaints of cough, sinus pressure, shortness of breath, nasal congestion for the past 2 weeks or so.  On exam, patient with diffuse wheeze auscultated bilateral lung fields.  No clinical evidence of pneumonia.  Patient with delta negative troponin, lack of acute ischemic change on EKG; low suspicion for ACS.  Patient with negative BNP without  pulmonary vascular congestion/pleural effusions or other abnormalities on chest x-ray imaging; low suspicion for CHF.  Patient with low Pesi score of 69-79; low suspicion for PE.  Patient was with diffuse wheeze on exam with improvement of breathing after nebulizer therapy was administered.  Suspect patient's shortness of breath is likely secondary to asthma exacerbation.  Will recommend continued use of albuterol as well as short course of corticosteroids in  the outpatient setting.  Will recommend further symptomatic therapy as described in AVS for presumed viral URI.  Chest x-ray without signs of pneumonia.  Regarding maxillary sinus pressure for the past couple of weeks, will place patient on empiric antibiotics for concern for bacterial sinusitis.  Will recommend close follow-up with primary care in the outpatient setting for reevaluation of symptoms.  Treatment plan discussed at length with patient and he acknowledged understand was agreeable to said plan.  Patient overall well-appearing, afebrile, nonhypoxic, in no acute distress. Worrisome signs and symptoms were discussed with the patient, and the patient acknowledged understanding to return to the ED if noticed. Patient was stable upon discharge.          Final Clinical Impression(s) / ED Diagnoses Final diagnoses:  Acute cough  Nasal congestion  Sinus pressure  Dyspnea, unspecified type  Exacerbation of asthma, unspecified asthma severity, unspecified whether persistent    Rx / DC Orders ED Discharge Orders          Ordered    cefdinir (OMNICEF) 300 MG capsule  2 times daily        03/02/23 1518    benzonatate (TESSALON) 100 MG capsule  3 times daily PRN        03/02/23 1518    albuterol (VENTOLIN HFA) 108 (90 Base) MCG/ACT inhaler  Every 6 hours PRN        03/02/23 1518    predniSONE (DELTASONE) 20 MG tablet  Daily with breakfast        03/02/23 1518              Peter Garter, [place] 03/02/23 1815    Laurence Spates, MD 03/05/23 1552

## 2023-03-02 NOTE — ED Notes (Signed)
Pt placed in room and will get EKG in the room.

## 2023-03-08 ENCOUNTER — Other Ambulatory Visit: Payer: Self-pay | Admitting: Cardiovascular Disease

## 2023-04-07 DIAGNOSIS — J4530 Mild persistent asthma, uncomplicated: Secondary | ICD-10-CM | POA: Diagnosis not present

## 2023-04-08 ENCOUNTER — Other Ambulatory Visit (HOSPITAL_COMMUNITY): Payer: Self-pay

## 2023-04-08 ENCOUNTER — Other Ambulatory Visit: Payer: Self-pay | Admitting: Pharmacist

## 2023-04-08 ENCOUNTER — Other Ambulatory Visit: Payer: Self-pay

## 2023-04-08 DIAGNOSIS — B20 Human immunodeficiency virus [HIV] disease: Secondary | ICD-10-CM

## 2023-04-08 MED ORDER — VALACYCLOVIR HCL 500 MG PO TABS
1000.0000 mg | ORAL_TABLET | Freq: Every day | ORAL | 5 refills | Status: AC
  Filled 2023-04-08 (×2): qty 60, 30d supply, fill #0
  Filled 2023-07-11: qty 60, 30d supply, fill #1
  Filled 2023-09-30: qty 60, 30d supply, fill #2
  Filled 2023-12-11: qty 60, 30d supply, fill #3
  Filled 2024-02-09: qty 60, 30d supply, fill #4
  Filled 2024-03-16: qty 60, 30d supply, fill #5

## 2023-04-08 MED ORDER — BIKTARVY 50-200-25 MG PO TABS
1.0000 | ORAL_TABLET | Freq: Every day | ORAL | 5 refills | Status: DC
  Filled 2023-04-08: qty 30, 30d supply, fill #0
  Filled 2023-05-06: qty 30, 30d supply, fill #1
  Filled 2023-05-23 – 2023-06-16 (×2): qty 30, 30d supply, fill #2
  Filled 2023-07-11: qty 30, 30d supply, fill #3
  Filled 2023-09-09: qty 30, 30d supply, fill #4

## 2023-04-08 NOTE — Progress Notes (Signed)
Specialty Pharmacy Initial Fill Coordination Note  Vincent Young is a 57 y.o. male contacted today regarding refills of specialty medication(s) Bictegravir-Emtricitab-Tenofov   Patient requested Delivery   Delivery date: 04/10/23   Verified address: [address] [place] [address]   Medication will be filled on 04/09/23.   Patient is aware of $0 copayment.

## 2023-04-08 NOTE — Progress Notes (Signed)
Specialty Pharmacy Initiation Note   Vincent Young is a 57 y.o. male who will be followed by the specialty pharmacy service for RxSp HIV    Review of administration, indication, effectiveness, safety, potential side effects, storage/disposable, and missed dose instructions occurred today for patient's specialty medication(s) Bictegravir-Emtricitab-Tenofov     Patient/Caregiver did not have any additional questions or concerns.   Patient's therapy is appropriate to: Continue    Goals Addressed             This Visit's Progress    Achieve Undetectable HIV Viral Load < 20       Patient is not on track and improving. Patient will work on increased adherence      Comply with lab assessments       Patient is on track. Patient will adhere to provider and/or lab appointments      Increase CD4 count until steady state       Patient is on track. Patient will work on increased adherence      Maintain optimal adherence to therapy       Patient is not on track and no change. Patient will work on increased adherence         Jennette Kettle Specialty Pharmacist

## 2023-04-09 ENCOUNTER — Other Ambulatory Visit (HOSPITAL_COMMUNITY): Payer: Self-pay

## 2023-04-09 DIAGNOSIS — J4530 Mild persistent asthma, uncomplicated: Secondary | ICD-10-CM | POA: Diagnosis not present

## 2023-04-10 ENCOUNTER — Other Ambulatory Visit: Payer: Self-pay

## 2023-04-10 ENCOUNTER — Other Ambulatory Visit (HOSPITAL_BASED_OUTPATIENT_CLINIC_OR_DEPARTMENT_OTHER): Payer: Self-pay

## 2023-04-10 ENCOUNTER — Other Ambulatory Visit (HOSPITAL_COMMUNITY): Payer: Self-pay

## 2023-04-23 ENCOUNTER — Other Ambulatory Visit: Payer: Self-pay

## 2023-04-28 ENCOUNTER — Other Ambulatory Visit: Payer: Self-pay

## 2023-05-06 ENCOUNTER — Other Ambulatory Visit (HOSPITAL_COMMUNITY): Payer: Self-pay

## 2023-05-06 ENCOUNTER — Other Ambulatory Visit: Payer: Self-pay

## 2023-05-06 NOTE — Progress Notes (Signed)
Specialty Pharmacy Refill Coordination Note  Vincent Young is a 57 y.o. male contacted today regarding refills of specialty medication(s) Bictegravir-Emtricitab-Tenofov Musician)   Patient requested Delivery   Delivery date: 05/08/23   Verified address: [address] [name] [address]   Medication will be filled on 05/07/23.

## 2023-05-07 ENCOUNTER — Other Ambulatory Visit: Payer: Self-pay

## 2023-05-23 ENCOUNTER — Other Ambulatory Visit: Payer: Self-pay

## 2023-05-23 NOTE — Progress Notes (Signed)
 Specialty Pharmacy Refill Coordination Note  [place] is a 58 y.o. male contacted today regarding refills of specialty medication(s) Bictegravir-Emtricitab-Tenofov (Biktarvy )   Patient requested Delivery   Delivery date: 06/03/23   Verified address: [address] [place] [address]   Medication will be filled on 06/02/23.

## 2023-06-02 ENCOUNTER — Other Ambulatory Visit (HOSPITAL_COMMUNITY): Payer: Self-pay

## 2023-06-03 ENCOUNTER — Other Ambulatory Visit (HOSPITAL_COMMUNITY): Payer: Self-pay

## 2023-06-04 ENCOUNTER — Other Ambulatory Visit (HOSPITAL_COMMUNITY): Payer: Self-pay

## 2023-06-05 ENCOUNTER — Other Ambulatory Visit (HOSPITAL_COMMUNITY): Payer: Self-pay

## 2023-06-06 ENCOUNTER — Other Ambulatory Visit (HOSPITAL_COMMUNITY): Payer: Self-pay

## 2023-06-06 ENCOUNTER — Other Ambulatory Visit: Payer: Self-pay

## 2023-06-06 ENCOUNTER — Other Ambulatory Visit: Payer: Self-pay | Admitting: Cardiovascular Disease

## 2023-06-09 ENCOUNTER — Other Ambulatory Visit: Payer: Self-pay

## 2023-06-10 ENCOUNTER — Other Ambulatory Visit: Payer: Self-pay

## 2023-06-10 DIAGNOSIS — M1711 Unilateral primary osteoarthritis, right knee: Secondary | ICD-10-CM | POA: Diagnosis not present

## 2023-06-10 DIAGNOSIS — Z6841 Body Mass Index (BMI) 40.0 and over, adult: Secondary | ICD-10-CM | POA: Diagnosis not present

## 2023-06-11 ENCOUNTER — Other Ambulatory Visit: Payer: Self-pay

## 2023-06-12 ENCOUNTER — Other Ambulatory Visit (HOSPITAL_COMMUNITY): Payer: Self-pay

## 2023-06-12 DIAGNOSIS — Z79899 Other long term (current) drug therapy: Secondary | ICD-10-CM | POA: Diagnosis not present

## 2023-06-12 DIAGNOSIS — Z5181 Encounter for therapeutic drug level monitoring: Secondary | ICD-10-CM | POA: Diagnosis not present

## 2023-06-13 ENCOUNTER — Other Ambulatory Visit (HOSPITAL_COMMUNITY): Payer: Self-pay

## 2023-06-13 ENCOUNTER — Other Ambulatory Visit: Payer: Self-pay

## 2023-06-16 ENCOUNTER — Telehealth: Payer: Self-pay

## 2023-06-16 ENCOUNTER — Other Ambulatory Visit (HOSPITAL_COMMUNITY): Payer: Self-pay

## 2023-06-16 ENCOUNTER — Other Ambulatory Visit: Payer: Self-pay

## 2023-06-16 NOTE — Progress Notes (Signed)
cancelling until new good days/grant info received from [facility]- d.dean notified

## 2023-06-16 NOTE — Telephone Encounter (Signed)
[facility] Patient Advocate Encounter   I was successful in securing patient a $ 2000.00 grant from [employer] to provide copayment coverage for Biktarvy.  The patient's out of pocket cost will be 0.00 monthly.     I have spoken with the patient.    The billing information is as follows and has been shared with [facility].   Member ID: [id number] Group ID: [id number] RxBin: [id number] Dates of Eligibility: 06/12/23 through 05/19/24  Patient knows to call the office with questions or concerns.  Clearance Coots, CPhT Specialty Pharmacy Patient [facility] Phone: [phone] Fax:  [phone]

## 2023-06-16 NOTE — Progress Notes (Signed)
Kennedy Bucker renewed - [facility] spoke with patient and they are aware of new delivery date. Mail 1.28

## 2023-06-17 ENCOUNTER — Other Ambulatory Visit: Payer: Self-pay

## 2023-07-11 ENCOUNTER — Other Ambulatory Visit: Payer: Self-pay

## 2023-07-11 NOTE — Progress Notes (Signed)
 Specialty Pharmacy Refill Coordination Note  Vincent Young is a 58 y.o. male contacted today regarding refills of specialty medication(s) Bictegravir-Emtricitab-Tenofov Musician)   Patient requested Delivery   Delivery date: 07/15/23   Verified address: [address] [name] [address]   Medication will be filled on 07/14/23.

## 2023-07-16 ENCOUNTER — Other Ambulatory Visit: Payer: Self-pay | Admitting: Cardiovascular Disease

## 2023-07-24 DIAGNOSIS — M170 Bilateral primary osteoarthritis of knee: Secondary | ICD-10-CM | POA: Diagnosis not present

## 2023-07-30 ENCOUNTER — Other Ambulatory Visit: Payer: Self-pay | Admitting: Cardiovascular Disease

## 2023-07-31 DIAGNOSIS — M170 Bilateral primary osteoarthritis of knee: Secondary | ICD-10-CM | POA: Diagnosis not present

## 2023-08-11 ENCOUNTER — Other Ambulatory Visit: Payer: Self-pay

## 2023-08-13 ENCOUNTER — Other Ambulatory Visit: Payer: Self-pay

## 2023-09-02 ENCOUNTER — Encounter (HOSPITAL_BASED_OUTPATIENT_CLINIC_OR_DEPARTMENT_OTHER): Payer: Self-pay | Admitting: Emergency Medicine

## 2023-09-02 ENCOUNTER — Other Ambulatory Visit: Payer: Self-pay

## 2023-09-02 DIAGNOSIS — Z21 Asymptomatic human immunodeficiency virus [HIV] infection status: Secondary | ICD-10-CM | POA: Diagnosis not present

## 2023-09-02 DIAGNOSIS — R112 Nausea with vomiting, unspecified: Secondary | ICD-10-CM | POA: Diagnosis not present

## 2023-09-02 DIAGNOSIS — K529 Noninfective gastroenteritis and colitis, unspecified: Secondary | ICD-10-CM | POA: Diagnosis not present

## 2023-09-02 DIAGNOSIS — I10 Essential (primary) hypertension: Secondary | ICD-10-CM | POA: Insufficient documentation

## 2023-09-02 DIAGNOSIS — Z79899 Other long term (current) drug therapy: Secondary | ICD-10-CM | POA: Diagnosis not present

## 2023-09-02 DIAGNOSIS — R1084 Generalized abdominal pain: Secondary | ICD-10-CM | POA: Diagnosis not present

## 2023-09-02 DIAGNOSIS — Z743 Need for continuous supervision: Secondary | ICD-10-CM | POA: Diagnosis not present

## 2023-09-02 DIAGNOSIS — M545 Low back pain, unspecified: Secondary | ICD-10-CM | POA: Diagnosis not present

## 2023-09-02 DIAGNOSIS — R197 Diarrhea, unspecified: Secondary | ICD-10-CM | POA: Diagnosis not present

## 2023-09-02 LAB — COMPREHENSIVE METABOLIC PANEL WITH GFR
ALT: 16 U/L (ref 0–44)
AST: 15 U/L (ref 15–41)
Albumin: 3.8 g/dL (ref 3.5–5.0)
Alkaline Phosphatase: 96 U/L (ref 38–126)
Anion gap: 9 (ref 5–15)
BUN: 14 mg/dL (ref 6–20)
CO2: 24 mmol/L (ref 22–32)
Calcium: 9.3 mg/dL (ref 8.9–10.3)
Chloride: 104 mmol/L (ref 98–111)
Creatinine, Ser: 1.01 mg/dL (ref 0.61–1.24)
GFR, Estimated: >60 mL/min (ref >60)
Glucose, Bld: 112 mg/dL — ABNORMAL HIGH (ref 70–99)
Potassium: 3.8 mmol/L (ref 3.5–5.1)
Sodium: 137 mmol/L (ref 135–145)
Total Bilirubin: 0.5 mg/dL (ref 0.0–1.2)
Total Protein: 7.5 g/dL (ref 6.5–8.1)

## 2023-09-02 LAB — LIPASE, BLOOD: Lipase: 12 U/L (ref 11–51)

## 2023-09-02 LAB — CBC
HCT: 35.8 % — ABNORMAL LOW (ref 39.0–52.0)
Hemoglobin: 12.1 g/dL — ABNORMAL LOW (ref 13.0–17.0)
MCH: 30.8 pg (ref 26.0–34.0)
MCHC: 33.8 g/dL (ref 30.0–36.0)
MCV: 91.1 fL (ref 80.0–100.0)
Platelets: 291 10*3/uL (ref 150–400)
RBC: 3.93 MIL/uL — ABNORMAL LOW (ref 4.22–5.81)
RDW: 13.3 % (ref 11.5–15.5)
WBC: 8.1 10*3/uL (ref 4.0–10.5)
nRBC: 0 % (ref 0.0–0.2)

## 2023-09-02 NOTE — ED Triage Notes (Addendum)
 From home with ems  Abdo pain n/v/d, lower back pain Since 8 Am Right sided abdo pain Ems VS 160/84 98 18 97% 117 CBG

## 2023-09-03 ENCOUNTER — Emergency Department (HOSPITAL_BASED_OUTPATIENT_CLINIC_OR_DEPARTMENT_OTHER)
Admission: EM | Admit: 2023-09-03 | Discharge: 2023-09-03 | Disposition: A | Discharge status: Home / Self Care | Attending: Emergency Medicine | Admitting: Emergency Medicine

## 2023-09-03 ENCOUNTER — Emergency Department (HOSPITAL_BASED_OUTPATIENT_CLINIC_OR_DEPARTMENT_OTHER)

## 2023-09-03 DIAGNOSIS — K5730 Diverticulosis of large intestine without perforation or abscess without bleeding: Secondary | ICD-10-CM | POA: Diagnosis not present

## 2023-09-03 DIAGNOSIS — N200 Calculus of kidney: Secondary | ICD-10-CM | POA: Diagnosis not present

## 2023-09-03 DIAGNOSIS — K529 Noninfective gastroenteritis and colitis, unspecified: Secondary | ICD-10-CM

## 2023-09-03 DIAGNOSIS — R109 Unspecified abdominal pain: Secondary | ICD-10-CM | POA: Diagnosis not present

## 2023-09-03 LAB — URINALYSIS, ROUTINE W REFLEX MICROSCOPIC
Bilirubin Urine: NEGATIVE
Glucose, UA: NEGATIVE mg/dL
Hgb urine dipstick: NEGATIVE
Ketones, ur: NEGATIVE mg/dL
Leukocytes,Ua: NEGATIVE
Nitrite: NEGATIVE
Protein, ur: 30 mg/dL — AB
Specific Gravity, Urine: 1.025 (ref 1.005–1.030)
pH: 5.5 (ref 5.0–8.0)

## 2023-09-03 MED ORDER — ONDANSETRON 8 MG PO TBDP: ORAL_TABLET | ORAL | 0 refills | Status: DC

## 2023-09-03 MED ORDER — SODIUM CHLORIDE 0.9 % IV BOLUS
1000.0000 mL | Freq: Once | INTRAVENOUS | Status: AC
  Administered 2023-09-03: 1000 mL via INTRAVENOUS

## 2023-09-03 MED ORDER — KETOROLAC TROMETHAMINE 30 MG/ML IJ SOLN
30.0000 mg | Freq: Once | INTRAMUSCULAR | Status: AC
  Administered 2023-09-03: 30 mg via INTRAVENOUS
  Filled 2023-09-03: qty 1

## 2023-09-03 MED ORDER — ONDANSETRON HCL 4 MG/2ML IJ SOLN
4.0000 mg | Freq: Once | INTRAMUSCULAR | Status: AC
  Administered 2023-09-03: 4 mg via INTRAVENOUS
  Filled 2023-09-03: qty 2

## 2023-09-03 NOTE — ED Provider Notes (Signed)
 [place] EMERGENCY DEPARTMENT AT [facility] Provider Note   CSN: [id number] Arrival date & time: 09/02/23  2241     History  Chief Complaint  Patient presents with   Abdominal Pain    Vincent Young is a 58 y.o. male.  Patient is a 58 year old male with history of HIV disease, hypertension, sleep apnea.  Patient presenting today for evaluation of nausea, vomiting, and diarrhea.  This has been ongoing since 8 AM yesterday.  Symptoms have been persistent with no fevers or chills.  He reports not being able to keep anything down.  He also describes intermittent pain in his right flank and lower abdomen radiating into his testicle for the past week.  No urinary complaints.      Home Medications Prior to Admission medications   Medication Sig Start Date End Date Taking? Authorizing Provider  albuterol (VENTOLIN HFA) 108 (90 Base) MCG/ACT inhaler Inhale 1-2 puffs into the lungs every 6 (six) hours as needed for wheezing or shortness of breath. 03/02/23   Sherian Maroon A, PA  amLODipine (NORVASC) 10 MG tablet TAKE 1 TABLET BY MOUTH EVERY DAY 07/16/23   O'Neal, Ronnald Ramp, MD  aspirin EC 81 MG tablet Take 1 tablet (81 mg total) by mouth daily. Swallow whole. Patient not taking: Reported on 01/24/2023 03/01/20   Sande Rives, MD  atorvastatin (LIPITOR) 80 MG tablet Take 1 tablet (80 mg total) by mouth daily. 06/06/23   O'Neal, Ronnald Ramp, MD  benzonatate (TESSALON) 100 MG capsule Take 1 capsule (100 mg total) by mouth 3 (three) times daily as needed for cough. 03/02/23   Sherian Maroon A, PA  bictegravir-emtricitabine-tenofovir AF (BIKTARVY) 50-200-25 MG TABS tablet Take 1 tablet by mouth daily. 04/08/23 04/07/24  Gardiner Barefoot, MD  cefdinir (OMNICEF) 300 MG capsule Take 1 capsule (300 mg total) by mouth 2 (two) times daily. 03/02/23   Peter Garter, PA  fluticasone (FLONASE) 50 MCG/ACT nasal spray Place 2 sprays into both nostrils daily as needed for  allergies.  06/01/18   [provider]  losartan (COZAAR) 100 MG tablet Take 1 tablet (100 mg total) by mouth daily. 04/29/22   O'NealRonnald Ramp, MD  meloxicam (MOBIC) 15 MG tablet Take 15 mg by mouth daily. 10/26/19   [provider]  metFORMIN (GLUCOPHAGE) 500 MG tablet TAKE 1 TABLET BY MOUTH EVERY DAY WITH A MEAL FOR 90 DAYS for 90    [provider]  nitroGLYCERIN (NITROSTAT) 0.4 MG SL tablet Place 1 tablet (0.4 mg total) under the tongue every 5 (five) minutes as needed. 04/17/22 07/16/22  O'NealRonnald Ramp, MD  oxyCODONE-acetaminophen (PERCOCET) 10-325 MG tablet Take 1 tablet twice a day by oral route for 30 days. 01/10/23   [provider]  tiZANidine (ZANAFLEX) 2 MG tablet Take 2 mg by mouth at bedtime. 01/14/23   [provider]  valACYclovir (VALTREX) 500 MG tablet Take 2 tablets (1,000 mg total) by mouth daily. 04/08/23   Gardiner Barefoot, MD      Allergies    Black walnut pollen allergy skin test, Shellfish allergy, Other, and Penicillins    Review of Systems   Review of Systems  All other systems reviewed and are negative.   Physical Exam Updated Vital Signs BP (!) 154/85 (BP Location: Right Arm)   Pulse 98   Temp 98.8 F (37.1 C) (Oral)   Resp 17   SpO2 96%  Physical Exam Vitals and nursing note reviewed.  Constitutional:  General: He is not in acute distress.    Appearance: He is well-developed. He is not diaphoretic.  HENT:     Head: Normocephalic and atraumatic.  Cardiovascular:     Rate and Rhythm: Normal rate and regular rhythm.     Heart sounds: No murmur heard.    No friction rub.  Pulmonary:     Effort: Pulmonary effort is normal. No respiratory distress.     Breath sounds: Normal breath sounds. No wheezing or rales.  Abdominal:     General: Bowel sounds are normal. There is no distension.     Palpations: Abdomen is soft.     Tenderness: There is no abdominal tenderness.  Musculoskeletal:         General: Normal range of motion.     Cervical back: Normal range of motion and neck supple.  Skin:    General: Skin is warm and dry.  Neurological:     Mental Status: He is alert and oriented to person, place, and time.     Coordination: Coordination normal.    ED Results / Procedures / Treatments   Labs (all labs ordered are listed, but only abnormal results are displayed) Labs Reviewed  COMPREHENSIVE METABOLIC PANEL WITH GFR - Abnormal; Notable for the following components:      Result Value   Glucose, Bld 112 (*)    All other components within normal limits  CBC - Abnormal; Notable for the following components:   RBC 3.93 (*)    Hemoglobin 12.1 (*)    HCT 35.8 (*)    All other components within normal limits  URINALYSIS, ROUTINE W REFLEX MICROSCOPIC - Abnormal; Notable for the following components:   Protein, ur 30 (*)    Bacteria, UA RARE (*)    All other components within normal limits  LIPASE, BLOOD    EKG None  Radiology No results found.  Procedures Procedures    Medications Ordered in ED Medications  sodium chloride 0.9 % bolus 1,000 mL (has no administration in time range)  ondansetron (ZOFRAN) injection 4 mg (has no administration in time range)  ketorolac (TORADOL) 30 MG/ML injection 30 mg (has no administration in time range)    ED Course/ Medical Decision Making/ A&P  Patient is a 58 year old male presenting with nausea, vomiting, and diarrhea as described in the HPI.  Patient arrives with stable vital signs and is afebrile.  He is well-hydrated appearing and abdomen is benign.  Laboratory studies obtained including CBC, CMP, and lipase, all of which are unremarkable.  Urinalysis is clear.  CT with renal protocol obtained due to his complaints of right flank pain.  This shows nonobstructing renal calculi, but no other acute process.  Patient has received IV fluids along with Toradol for pain and Zofran for nausea and seems to be feeling better.  I  highly suspect a viral etiology to his symptoms.  I will prescribe Zofran and have him adhere to a clear liquid diet for the next 12 hours.  To return as needed for any problems.  Final Clinical Impression(s) / ED Diagnoses Final diagnoses:  None    Rx / DC Orders ED Discharge Orders     None         Geoffery Lyons, MD 09/03/23 [phone]

## 2023-09-03 NOTE — ED Notes (Signed)
 Patient transported to CT

## 2023-09-03 NOTE — Discharge Instructions (Signed)
 Begin taking Zofran as prescribed as needed for nausea.  Clear liquid diet for the next 12 hours, then slowly advance diet as tolerated.  Follow-up with primary doctor if not improving in the next few days, and return to the ER if you develop any new and/or concerning issues.

## 2023-09-09 ENCOUNTER — Other Ambulatory Visit (HOSPITAL_COMMUNITY): Payer: Self-pay

## 2023-09-09 ENCOUNTER — Other Ambulatory Visit: Payer: Self-pay

## 2023-09-09 NOTE — Progress Notes (Signed)
 Specialty Pharmacy Refill Coordination Note  Vincent Young is a 58 y.o. male contacted today regarding refills of specialty medication(s) Bictegravir-Emtricitab-Tenofov (Biktarvy )   Patient requested Delivery   Delivery date: 09/11/23   Verified address: [address] [place] [address]   Medication will be filled on 09/10/23.

## 2023-09-10 ENCOUNTER — Other Ambulatory Visit: Payer: Self-pay

## 2023-09-11 ENCOUNTER — Other Ambulatory Visit: Payer: Self-pay

## 2023-09-11 ENCOUNTER — Other Ambulatory Visit

## 2023-09-11 DIAGNOSIS — B20 Human immunodeficiency virus [HIV] disease: Secondary | ICD-10-CM

## 2023-09-15 LAB — HIV-1 RNA QUANT-NO REFLEX-BLD
HIV 1 RNA Quant: <20 {copies}/mL — AB
HIV-1 RNA Quant, Log: <1.3 {Log_copies}/mL — AB

## 2023-09-15 LAB — T-HELPER CELLS (CD4) COUNT (NOT AT ARMC)
Absolute CD4: 978 {cells}/uL (ref 490–1740)
CD4 T Helper %: 29 % — ABNORMAL LOW (ref 30–61)
Total lymphocyte count: 3425 {cells}/uL (ref 850–3900)

## 2023-09-19 DIAGNOSIS — Z21 Asymptomatic human immunodeficiency virus [HIV] infection status: Secondary | ICD-10-CM | POA: Diagnosis not present

## 2023-09-19 DIAGNOSIS — E782 Mixed hyperlipidemia: Secondary | ICD-10-CM | POA: Diagnosis not present

## 2023-09-19 DIAGNOSIS — E1142 Type 2 diabetes mellitus with diabetic polyneuropathy: Secondary | ICD-10-CM | POA: Diagnosis not present

## 2023-09-19 DIAGNOSIS — E785 Hyperlipidemia, unspecified: Secondary | ICD-10-CM | POA: Diagnosis not present

## 2023-09-19 DIAGNOSIS — I700 Atherosclerosis of aorta: Secondary | ICD-10-CM | POA: Diagnosis not present

## 2023-09-25 ENCOUNTER — Ambulatory Visit: Admitting: Infectious Diseases

## 2023-09-25 DIAGNOSIS — B20 Human immunodeficiency virus [HIV] disease: Secondary | ICD-10-CM

## 2023-09-30 ENCOUNTER — Ambulatory Visit (INDEPENDENT_AMBULATORY_CARE_PROVIDER_SITE_OTHER): Admitting: Infectious Diseases

## 2023-09-30 ENCOUNTER — Other Ambulatory Visit: Payer: Self-pay

## 2023-09-30 ENCOUNTER — Encounter: Payer: Self-pay | Admitting: Infectious Diseases

## 2023-09-30 ENCOUNTER — Other Ambulatory Visit (HOSPITAL_COMMUNITY): Payer: Self-pay

## 2023-09-30 VITALS — BP 128/84 | HR 95 | Temp 98.7°F | Wt 391.6 lb

## 2023-09-30 DIAGNOSIS — E119 Type 2 diabetes mellitus without complications: Secondary | ICD-10-CM

## 2023-09-30 DIAGNOSIS — Z5181 Encounter for therapeutic drug level monitoring: Secondary | ICD-10-CM

## 2023-09-30 DIAGNOSIS — Z23 Encounter for immunization: Secondary | ICD-10-CM | POA: Diagnosis not present

## 2023-09-30 DIAGNOSIS — B20 Human immunodeficiency virus [HIV] disease: Secondary | ICD-10-CM

## 2023-09-30 DIAGNOSIS — I10 Essential (primary) hypertension: Secondary | ICD-10-CM | POA: Diagnosis not present

## 2023-09-30 DIAGNOSIS — Z6841 Body Mass Index (BMI) 40.0 and over, adult: Secondary | ICD-10-CM

## 2023-09-30 DIAGNOSIS — Z7984 Long term (current) use of oral hypoglycemic drugs: Secondary | ICD-10-CM

## 2023-09-30 DIAGNOSIS — Z7985 Long-term (current) use of injectable non-insulin antidiabetic drugs: Secondary | ICD-10-CM

## 2023-09-30 DIAGNOSIS — Z0000 Encounter for general adult medical examination without abnormal findings: Secondary | ICD-10-CM

## 2023-09-30 DIAGNOSIS — Z87891 Personal history of nicotine dependence: Secondary | ICD-10-CM

## 2023-09-30 MED ORDER — BIKTARVY 50-200-25 MG PO TABS
1.0000 | ORAL_TABLET | Freq: Every day | ORAL | 11 refills | Status: AC
  Filled 2023-09-30 – 2023-10-17 (×2): qty 30, 30d supply, fill #0
  Filled 2023-11-19: qty 30, 30d supply, fill #1
  Filled 2023-12-18 – 2023-12-22 (×2): qty 30, 30d supply, fill #2
  Filled 2024-01-20 – 2024-02-09 (×2): qty 30, 30d supply, fill #3
  Filled 2024-03-08 – 2024-03-16 (×2): qty 30, 30d supply, fill #4
  Filled 2024-04-07 – 2024-04-08 (×2): qty 30, 30d supply, fill #5
  Filled 2024-04-30 – 2024-05-06 (×2): qty 30, 30d supply, fill #6
  Filled 2024-06-02 – 2024-06-04 (×2): qty 30, 30d supply, fill #7

## 2023-09-30 NOTE — Progress Notes (Signed)
 Specialty Pharmacy Ongoing Clinical Assessment Note  Vincent Young is a 58 y.o. male who is being followed by the specialty pharmacy service for RxSp HIV   Patient's specialty medication(s) reviewed today: Bictegravir-Emtricitab-Tenofov (Biktarvy )   Missed doses in the last 4 weeks: More than 5   Patient/Caregiver did not have any additional questions or concerns.   Therapeutic benefit summary: Patient is achieving benefit   Adverse events/side effects summary: No adverse events/side effects   Patient's therapy is appropriate to: Continue    Goals Addressed             This Visit's Progress    Achieve Undetectable HIV Viral Load < 20   On track    Patient is on track. Patient will work on increased adherence. Patient's viral load was <20 copies/mL as of 09/11/23      Comply with lab assessments   On track    Patient is on track. Patient will adhere to provider and/or lab appointments      Increase CD4 count until steady state   On track    Patient is on track. Patient will work on increased adherence      Maintain optimal adherence to therapy   No change    Patient is not on track and no change. Patient will work on increased adherence         Follow up: 6 months  Advertising account planner

## 2023-09-30 NOTE — Progress Notes (Unsigned)
 [address], [place], [place], [address]                                                                  Phn. [phone]; Fax: [phone]                                                                             Date: 09/30/23  Reason for Visit: Routine HIV care.  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 He has started Mounjaro for diabetes management and is motivated to lose weight. He plans to begin exercising and return to the gym next week. He experiences significant knee pain, rated as eight out of ten, and has stopped taking oxycodone , opting for natural herbal remedies.  His work schedule impacts his eating habits, typically consuming two meals a day and often eating unhealthy foods due to time constraints. Denies recent sexual activity, but prefers male partners.  He is willing to to get [facility] # 2 and PCV 20. Follows dentist.   Seen in the ED 03/02/23 for sinusitis/asthma exacerbation and 09/03/23 for nausea, vomiting, diarrhea with unremarkable work up and thought to be viral etiology.   No other complaints.   ROS: As stated in above HPI; all other systems were reviewed and are otherwise negative unless noted below  No reported fever / chills, night sweats, unintentional weight loss, acute visual change, odynophagia, chest pain/pressure, new or worsened SOB or WOB, nausea,  vomiting, diarrhea, dysuria, GU discharge, syncope, seizures, red/hot swollen joints, hallucinations / delusions, rashes, new allergies, unusual / excessive bleeding, swollen lymph nodes, or new hospitalizations/ED visits/Urgent Care visits since the pt was last seen.  PMH/ PSH/ FamHx / Social Hx , medications and allergies reviewed and updated as appropriate; please see corresponding tab in EHR / prior notes                                        MEDS  Allergies  Allergen Reactions   Black Walnut Pollen Allergy Skin Test Anaphylaxis   Shellfish Allergy Anaphylaxis   Other Other (See Comments)    Tree nuts cause throat swelling Grass pollen causes  itchy, sneezing   Penicillins Rash    10 years ago   Past Medical History:  Diagnosis Date   Acute renal failure (HCC)    due to dehydration   Arthritis    Asthma    Coronary artery disease    Diabetes mellitus without complication (HCC)    DJD (degenerative joint disease)    Exposure to TB    Family history of adverse reaction to anesthesia    " My brother has had PONV"   GERD (gastroesophageal reflux disease)    Headache    HIV infection (HCC)    Hypertension    Obesity    OSA (obstructive sleep apnea) 07/12/2020   Pilonidal cyst    Recurrent genital HSV (herpes simplex virus) infection    Sleep apnea    never tested-has s/s   Past Surgical History:  Procedure Laterality Date   APPENDECTOMY     CHONDROPLASTY  12/16/2011   Procedure: CHONDROPLASTY;  Surgeon: Ilean Mall, MD;  Location: [facility];  Service: Orthopedics;  Laterality: Right;  Right knee medial and lateral menisectomy and Debridement 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,Trochlea    COLONOSCOPY W/ BIOPSIES AND POLYPECTOMY     COLONOSCOPY WITH PROPOFOL  N/A 08/29/2014   Procedure: COLONOSCOPY WITH PROPOFOL ;  Surgeon: Ozell Blunt, MD;  Location: [facility] ENDOSCOPY;  Service: Endoscopy;  Laterality: N/A;   COLONOSCOPY WITH  PROPOFOL  N/A 11/16/2019   Procedure: COLONOSCOPY WITH PROPOFOL ;  Surgeon: Ozell Blunt, MD;  Location: [facility] ENDOSCOPY;  Service: Endoscopy;  Laterality: N/A;   HERNIA REPAIR     inguinal as a child   LEFT HEART CATH AND CORONARY ANGIOGRAPHY N/A 03/15/2020   Procedure: LEFT HEART CATH AND CORONARY ANGIOGRAPHY;  Surgeon: Arleen Lacer, MD;  Location: [facility] INVASIVE CV LAB;  Service: Cardiovascular;  Laterality: N/A;   PEG TUBE PLACEMENT  2009   POLYPECTOMY  11/16/2019   Procedure: POLYPECTOMY;  Surgeon: Ozell Blunt, MD;  Location: [facility] ENDOSCOPY;  Service: Endoscopy;;   TONSILLECTOMY     UVULECTOMY  2009   Social History   Socioeconomic History   Marital status: Legally Separated    Spouse name: Not on file   Number of children: 6   Years of education: Not on file   Highest education level: Not on file  Occupational History   Occupation: [employer]   Tobacco Use   Smoking status: Former    Current packs/day: 0.00    Types: Cigarettes    Start date: 12/03/1973    Quit date: 12/04/2003    Years since quitting: 19.8   Smokeless tobacco: Never   Tobacco comments:    pt. no longer smokes  Vaping Use   Vaping status: Never Used  Substance and Sexual Activity   Alcohol use: Not Currently    Comment: occasional   Drug use: Not Currently   Sexual activity: Yes    Partners: Female    Comment: pt. declined condoms  Other Topics Concern   Not on file  Social History Narrative   Not on file   Social Drivers of Health   Financial Resource Strain: Not on file  Food Insecurity: No Food Insecurity (02/18/2022)   Hunger Vital Sign    Worried About Running Out of Food in the Last Year: Never true    Ran Out of Food in the Last Year: Never true  Transportation Needs: No Transportation Needs (02/18/2022)   PRAPARE - Transportation    Lack of Transportation (  Medical): No    Lack of Transportation (Non-Medical): No  Physical Activity: Not on file  Stress: Not on file  Social Connections: Unknown  (10/02/2021)   Received from [facility], [facility]   Social Network    Social Network: Not on file  Intimate Partner Violence: Unknown (08/22/2021)   Received from [facility], [facility]   HITS    Physically Hurt: Not on file    Insult or Talk Down To: Not on file    Threaten Physical Harm: Not on file    Scream or Curse: Not on file   Family History  Problem Relation Age of Onset   Heart disease Father    Hypertension Father    Hypertension Mother    Arthritis Mother    Hypertension Sister    Arthritis Sister    Hypertension Brother    Vitals  BP 128/84   Pulse 95   Temp 98.7 F (37.1 C) (Oral)   Wt (!) 391 lb 9.6 oz (177.6 kg)   SpO2 93%   BMI 54.62 kg/m   Examination  Gen: no acute distress, morbidly obese  HEENT: [place]/AT, no scleral icterus, no pale conjunctivae, hearing normal, oral mucosa moist Neck: Supple Cardio: Regular rate and rhythm, S1S2 Resp: Pulmonary effort normal in room air, Normal breath sounds  GI: nondistended, non tender and soft GU: Musc: Extremities: No pedal edema Skin: No rashes Neuro: grossly non focal , awake, alert and oriented * 3  Psych: Calm, cooperative  Lab Results HIV 1 RNA Quant  Date Value  09/11/2023 <20 DETECTED copies/mL (A)  01/24/2023 30 Copies/mL (H)  01/16/2022 Not Detected Copies/mL   HIV-1 RNA Viral Load (no units)  Date Value  06/08/2013 <40  05/11/2013 <40  02/16/2013 <40   CD4 (no units)  Date Value  06/08/2013 480  05/11/2013 691  02/16/2013 576   CD4 T Cell Abs (/uL)  Date Value  01/16/2022 738  03/22/2021 736  09/20/2020 733   No results found for: "HIV1GENOSEQ" Lab Results  Component Value Date   WBC 8.1 09/02/2023   HGB 12.1 (L) 09/02/2023   HCT 35.8 (L) 09/02/2023   MCV 91.1 09/02/2023   PLT 291 09/02/2023    Lab Results  Component Value Date   CREATININE 1.01 09/02/2023   BUN 14 09/02/2023   NA 137 09/02/2023   K 3.8 09/02/2023   CL 104 09/02/2023   CO2 24 09/02/2023    Lab Results  Component Value Date   ALT 16 09/02/2023   AST 15 09/02/2023   ALKPHOS 96 09/02/2023   BILITOT 0.5 09/02/2023    Lab Results  Component Value Date   CHOL 152 01/24/2023   TRIG 97 01/24/2023   HDL 47 01/24/2023   LDLCALC 86 01/24/2023   Lab Results  Component Value Date   HAV NON REACTIVE 11/24/2014   Lab Results  Component Value Date   HEPBSAG NEG 10/18/2009   HEPBSAB POS (A) 11/24/2014   Lab Results  Component Value Date   HCVAB NEG 10/18/2009   Lab Results  Component Value Date   CHLAMYDIAWP Negative 01/16/2022   N Negative 01/16/2022   No results found for: "GCPROBEAPT" No results found for: "QUANTGOLD"  Health Maintenance: Immunization History  Administered Date(s) Administered   Hepatitis A 08/31/2008   Hepatitis A, Adult 10/24/2016   Hepatitis B 08/31/2008, 10/06/2008   Influenza Split 02/04/2011, 02/07/2012, 02/12/2016   Influenza Whole 03/10/2008   Influenza,inj,Quad PF,6+ Mos 02/02/2013, 01/24/2016, 01/21/2018, 04/05/2020, 04/04/2021,  02/12/2022   Meningococcal Mcv4o 11/21/2016   PFIZER Comirnaty(Gray Top)Covid-19 Tri-Sucrose Vaccine 11/03/2020   PFIZER(Purple Top)SARS-COV-2 Vaccination 08/19/2019, 09/11/2019, 10/01/2019, 04/07/2020   Pfizer Covid-19 Vaccine Bivalent Booster 74yrs & up 04/04/2021   Pneumococcal Conjugate-13 01/21/2018   Pneumococcal Polysaccharide-23 10/06/2008, 10/19/2013   Td 10/06/2008    Assessment/Plan: # HIV - continue Biktarvy   - discussed labs  - Fu in 4 months per patient preference   # Morbid Obesity  # DM2 # HTN - discussed healthy diet containing more fruits and vegetables and regular exercise - on meds including statin - Following with PCP  # Immunization  - Menveo #2 and PCV 20 today   # Health maintenance - lipid panel today - Follows dentist - Colonoscopy in 10/2019 - diverticulolsis, one polyp removed   Patient's labs were reviewed as well as his previous records. Patients questions  were addressed and answered. Safe sex counseling done.   I have personally spent 45 minutes involved in face-to-face and non-face-to-face activities for this patient on the day of the visit. Professional time spent includes the following activities: Preparing to see the patient (review of tests), Obtaining and/or reviewing separately obtained history (admission/discharge record), Performing a medically appropriate examination and/or evaluation , Ordering medications/tests/procedures, referring and communicating with other health care professionals, Documenting clinical information in the EMR, Independently interpreting results (not separately reported), Communicating results to the patient/family/caregiver, Counseling and educating the patient/family/caregiver and Care coordination (not separately reported).   Of note, portions of this note may have been created with voice recognition software. While this note has been edited for accuracy, occasional wrong-word or 'sound-a-like' substitutions may have occurred due to the inherent limitations of voice recognition software.   Electronically signed by:  Terre Ferri, MD Infectious Disease Physician [facility] [address] [place], [place] [address] Phone: [phone]  Fax: [phone]

## 2023-10-01 ENCOUNTER — Other Ambulatory Visit: Payer: Self-pay | Admitting: Cardiovascular Disease

## 2023-10-01 DIAGNOSIS — Z23 Encounter for immunization: Secondary | ICD-10-CM | POA: Insufficient documentation

## 2023-10-02 NOTE — Progress Notes (Signed)
 The 10-year ASCVD risk score (Arnett DK, et al., 2019) is: 22.7%   Values used to calculate the score:     Age: 58 years     Sex: Male     Is Non-Hispanic African American: Yes     Diabetic: Yes     Tobacco smoker: No     Systolic Blood Pressure: 128 mmHg     Is BP treated: Yes     HDL Cholesterol: 47 mg/dL     Total Cholesterol: 152 mg/dL  Currently prescribed atorvastatin  80 mg.   Dearra Myhand, BSN, RN

## 2023-10-14 DIAGNOSIS — R109 Unspecified abdominal pain: Secondary | ICD-10-CM | POA: Diagnosis not present

## 2023-10-14 DIAGNOSIS — E1142 Type 2 diabetes mellitus with diabetic polyneuropathy: Secondary | ICD-10-CM | POA: Diagnosis not present

## 2023-10-17 ENCOUNTER — Other Ambulatory Visit (HOSPITAL_COMMUNITY): Payer: Self-pay

## 2023-10-17 ENCOUNTER — Other Ambulatory Visit: Payer: Self-pay

## 2023-10-17 ENCOUNTER — Other Ambulatory Visit: Payer: Self-pay | Admitting: Pharmacy Technician

## 2023-10-17 NOTE — Progress Notes (Signed)
 Specialty Pharmacy Refill Coordination Note  Vincent Young is a 58 y.o. male contacted today regarding refills of specialty medication(s) Bictegravir-Emtricitab-Tenofov (Biktarvy )   Patient requested Delivery   Delivery date: 10/27/23   Verified address: [address] [place] [address]   Medication will be filled on 10/24/23.

## 2023-10-18 ENCOUNTER — Other Ambulatory Visit: Payer: Self-pay | Admitting: Cardiovascular Disease

## 2023-10-18 DIAGNOSIS — E1169 Type 2 diabetes mellitus with other specified complication: Secondary | ICD-10-CM | POA: Diagnosis not present

## 2023-10-18 DIAGNOSIS — E118 Type 2 diabetes mellitus with unspecified complications: Secondary | ICD-10-CM | POA: Diagnosis not present

## 2023-10-18 DIAGNOSIS — J4530 Mild persistent asthma, uncomplicated: Secondary | ICD-10-CM | POA: Diagnosis not present

## 2023-10-18 DIAGNOSIS — E782 Mixed hyperlipidemia: Secondary | ICD-10-CM | POA: Diagnosis not present

## 2023-10-24 ENCOUNTER — Other Ambulatory Visit: Payer: Self-pay

## 2023-11-19 ENCOUNTER — Other Ambulatory Visit (HOSPITAL_COMMUNITY): Payer: Self-pay

## 2023-11-19 ENCOUNTER — Other Ambulatory Visit: Payer: Self-pay

## 2023-11-19 NOTE — Progress Notes (Signed)
 Specialty Pharmacy Refill Coordination Note  Spoke with Vincent Young  Vincent Young is a 58 y.o. male contacted today regarding refills of specialty medication(s) Bictegravir-Emtricitab-Tenofov (Biktarvy )  Patient requested: Delivery   Delivery date: 11/24/23   Verified address: [address] [place] [address]  Medication will be filled on 11/20/23.

## 2023-12-11 ENCOUNTER — Other Ambulatory Visit (HOSPITAL_COMMUNITY): Payer: Self-pay

## 2023-12-18 ENCOUNTER — Other Ambulatory Visit: Payer: Self-pay

## 2023-12-22 ENCOUNTER — Other Ambulatory Visit: Payer: Self-pay

## 2023-12-22 ENCOUNTER — Other Ambulatory Visit (HOSPITAL_COMMUNITY): Payer: Self-pay

## 2023-12-22 NOTE — Progress Notes (Signed)
 Specialty Pharmacy Refill Coordination Note  Spoke with Vincent Young is a 58 y.o. male contacted today regarding refills of specialty medication(s) Bictegravir-Emtricitab-Tenofov (Biktarvy )  Doses on hand: 6  Patient requested: Delivery   Delivery date: 12/24/23   Verified address: [address] [place] [address]  Medication will be filled on 12/23/23.

## 2023-12-23 ENCOUNTER — Other Ambulatory Visit: Payer: Self-pay | Admitting: Cardiovascular Disease

## 2024-01-19 ENCOUNTER — Other Ambulatory Visit: Payer: Self-pay | Admitting: Cardiovascular Disease

## 2024-01-20 ENCOUNTER — Other Ambulatory Visit: Payer: Self-pay

## 2024-01-22 ENCOUNTER — Other Ambulatory Visit (HOSPITAL_COMMUNITY): Payer: Self-pay

## 2024-02-03 DIAGNOSIS — Z6841 Body Mass Index (BMI) 40.0 and over, adult: Secondary | ICD-10-CM | POA: Diagnosis not present

## 2024-02-03 DIAGNOSIS — M1711 Unilateral primary osteoarthritis, right knee: Secondary | ICD-10-CM | POA: Diagnosis not present

## 2024-02-09 ENCOUNTER — Ambulatory Visit: Admitting: Infectious Diseases

## 2024-02-09 ENCOUNTER — Other Ambulatory Visit: Payer: Self-pay | Admitting: Pharmacy Technician

## 2024-02-09 ENCOUNTER — Other Ambulatory Visit: Payer: Self-pay

## 2024-02-09 NOTE — Progress Notes (Signed)
 Specialty Pharmacy Refill Coordination Note  Vincent Young is a 58 y.o. male contacted today regarding refills of specialty medication(s) Bictegravir-Emtricitab-Tenofov (Biktarvy )   Patient requested Delivery   Delivery date: 02/11/24   Verified address: [address]  [place]   Medication will be filled on 02/10/24.

## 2024-02-18 ENCOUNTER — Other Ambulatory Visit: Payer: Self-pay | Admitting: Cardiovascular Disease

## 2024-02-24 ENCOUNTER — Encounter: Payer: Self-pay | Admitting: Infectious Diseases

## 2024-02-24 ENCOUNTER — Ambulatory Visit: Admitting: Infectious Diseases

## 2024-02-24 ENCOUNTER — Other Ambulatory Visit (HOSPITAL_COMMUNITY)
Admission: RE | Admit: 2024-02-24 | Discharge: 2024-02-24 | Disposition: A | Discharge status: Home / Self Care | Source: Ambulatory Visit | Attending: Infectious Diseases | Admitting: Infectious Diseases

## 2024-02-24 ENCOUNTER — Other Ambulatory Visit: Payer: Self-pay

## 2024-02-24 VITALS — BP 141/90 | HR 82 | Temp 97.6°F | Resp 16 | Wt 360.0 lb

## 2024-02-24 DIAGNOSIS — Z5181 Encounter for therapeutic drug level monitoring: Secondary | ICD-10-CM

## 2024-02-24 DIAGNOSIS — Z79899 Other long term (current) drug therapy: Secondary | ICD-10-CM

## 2024-02-24 DIAGNOSIS — B20 Human immunodeficiency virus [HIV] disease: Secondary | ICD-10-CM | POA: Diagnosis not present

## 2024-02-24 DIAGNOSIS — Z7185 Encounter for immunization safety counseling: Secondary | ICD-10-CM

## 2024-02-24 DIAGNOSIS — Z113 Encounter for screening for infections with a predominantly sexual mode of transmission: Secondary | ICD-10-CM | POA: Diagnosis not present

## 2024-02-24 DIAGNOSIS — E669 Obesity, unspecified: Secondary | ICD-10-CM | POA: Diagnosis not present

## 2024-02-24 DIAGNOSIS — Z7984 Long term (current) use of oral hypoglycemic drugs: Secondary | ICD-10-CM

## 2024-02-24 DIAGNOSIS — Z23 Encounter for immunization: Secondary | ICD-10-CM | POA: Diagnosis not present

## 2024-02-24 DIAGNOSIS — E119 Type 2 diabetes mellitus without complications: Secondary | ICD-10-CM | POA: Diagnosis not present

## 2024-02-24 DIAGNOSIS — Z0000 Encounter for general adult medical examination without abnormal findings: Secondary | ICD-10-CM

## 2024-02-24 DIAGNOSIS — I10 Essential (primary) hypertension: Secondary | ICD-10-CM | POA: Diagnosis not present

## 2024-02-24 NOTE — Progress Notes (Unsigned)
 [address], [place], [place], [address]                                                                  Phn. [phone]; Fax: [phone]                                                                             Date: 09/30/23  Reason for Visit: Routine HIV care.  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 He has started Mounjaro for diabetes management and is motivated to lose weight. He plans to begin exercising and return to the gym next week. He experiences significant knee pain, rated as eight out of ten, and has stopped taking oxycodone , opting for natural herbal remedies.  His work schedule impacts his eating habits, typically consuming two meals a day and often eating unhealthy foods due to time constraints. Denies recent sexual activity, but prefers male partners.  He is willing to to get [facility] # 2 and PCV 20. Follows dentist.   Seen in the ED 03/02/23 for sinusitis/asthma exacerbation and 09/03/23 for nausea, vomiting, diarrhea with unremarkable work up and thought to be viral etiology.   No other complaints.   Lost 20-30 lbs, starting gym, on moujaro, wants to get shingrix and tdap. Denies sad, missed meds one/2, no other concerns, decline flu.  ROS: As stated in above HPI; all other systems were reviewed and are otherwise negative unless noted below  No reported fever / chills,  night sweats, unintentional weight loss, acute visual change, odynophagia, chest pain/pressure, new or worsened SOB or WOB, nausea, vomiting, diarrhea, dysuria, GU discharge, syncope, seizures, red/hot swollen joints, hallucinations / delusions, rashes, new allergies, unusual / excessive bleeding, swollen lymph nodes, or new hospitalizations/ED visits/Urgent Care visits since the pt was last seen.  PMH/ PSH/ FamHx / Social Hx , medications and allergies reviewed and updated as appropriate; please see corresponding tab in EHR / prior notes                                        MEDS  Allergies  Allergen Reactions   Black Walnut Pollen Allergy Skin  Test Anaphylaxis   Shellfish Allergy Anaphylaxis   Other Other (See Comments)    Tree nuts cause throat swelling Grass pollen causes itchy, sneezing   Penicillins Rash    10 years ago   Past Medical History:  Diagnosis Date   Acute renal failure    due to dehydration   Arthritis    Asthma    Coronary artery disease    Diabetes mellitus without complication (HCC)    DJD (degenerative joint disease)    Exposure to TB    Family history of adverse reaction to anesthesia     My brother has had PONV   GERD (gastroesophageal reflux disease)    Headache    HIV infection (HCC)    Hypertension    Obesity    OSA (obstructive sleep apnea) 07/12/2020   Pilonidal cyst    Recurrent genital HSV (herpes simplex virus) infection    Sleep apnea    never tested-has s/s   Past Surgical History:  Procedure Laterality Date   APPENDECTOMY     CHONDROPLASTY  12/16/2011   Procedure: CHONDROPLASTY;  Surgeon: Dempsey JINNY Sensor, MD;  Location: [facility];  Service: Orthopedics;  Laterality: Right;  Right knee medial and lateral menisectomy and Debridement 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,Trochlea    COLONOSCOPY W/ BIOPSIES AND POLYPECTOMY     COLONOSCOPY WITH PROPOFOL  N/A 08/29/2014   Procedure:  COLONOSCOPY WITH PROPOFOL ;  Surgeon: Oliva Boots, MD;  Location: [facility] ENDOSCOPY;  Service: Endoscopy;  Laterality: N/A;   COLONOSCOPY WITH PROPOFOL  N/A 11/16/2019   Procedure: COLONOSCOPY WITH PROPOFOL ;  Surgeon: Boots Oliva, MD;  Location: [facility] ENDOSCOPY;  Service: Endoscopy;  Laterality: N/A;   HERNIA REPAIR     inguinal as a child   LEFT HEART CATH AND CORONARY ANGIOGRAPHY N/A 03/15/2020   Procedure: LEFT HEART CATH AND CORONARY ANGIOGRAPHY;  Surgeon: Anner Alm ORN, MD;  Location: [facility] INVASIVE CV LAB;  Service: Cardiovascular;  Laterality: N/A;   PEG TUBE PLACEMENT  2009   POLYPECTOMY  11/16/2019   Procedure: POLYPECTOMY;  Surgeon: Boots Oliva, MD;  Location: [facility] ENDOSCOPY;  Service: Endoscopy;;   TONSILLECTOMY     UVULECTOMY  2009   Social History   Socioeconomic History   Marital status: Legally Separated    Spouse name: Not on file   Number of children: 6   Years of education: Not on file   Highest education level: Not on file  Occupational History   Occupation: [employer]   Tobacco Use   Smoking status: Former    Current packs/day: 0.00    Types: Cigarettes    Start date: 12/03/1973    Quit date: 12/04/2003    Years since quitting: 20.2   Smokeless tobacco: Never   Tobacco comments:    pt. no longer smokes  Vaping Use   Vaping status: Never Used  Substance and Sexual Activity   Alcohol use: Not Currently    Comment: occasional   Drug use: Not Currently   Sexual activity: Yes    Partners: Female    Comment: pt. declined condoms  Other Topics Concern   Not on file  Social History Narrative   Not on file   Social Drivers of Health   Financial Resource Strain: Not on file  Food Insecurity: No Food Insecurity (02/18/2022)   Hunger Vital Sign    Worried About Running Out of Food in the Last Year: Never true    Ran Out of Food in  the Last Year: Never true  Transportation Needs: No Transportation Needs (02/18/2022)   PRAPARE - Administrator, Civil Service (Medical):  No    Lack of Transportation (Non-Medical): No  Physical Activity: Not on file  Stress: Not on file  Social Connections: Unknown (10/02/2021)   [facility] from [facility]   Social Network    Social Network: Not on file  Intimate Partner Violence: Unknown (08/22/2021)   Received from [facility]   HITS    Physically Hurt: Not on file    Insult or Talk Down To: Not on file    Threaten Physical Harm: Not on file    Scream or Curse: Not on file   Family History  Problem Relation Age of Onset   Heart disease Father    Hypertension Father    Hypertension Mother    Arthritis Mother    Hypertension Sister    Arthritis Sister    Hypertension Brother    Vitals  There were no vitals taken for this visit.  Examination  Gen: no acute distress, morbidly obese  HEENT: [place]/AT, no scleral icterus, no pale conjunctivae, hearing normal, oral mucosa moist Neck: Supple Cardio: Regular rate and rhythm, S1S2 Resp: Pulmonary effort normal in room air, Normal breath sounds  GI: nondistended, non tender and soft GU: Musc: Extremities: No pedal edema Skin: No rashes Neuro: grossly non focal , awake, alert and oriented * 3  Psych: Calm, cooperative  Lab Results HIV 1 RNA Quant  Date Value  09/11/2023 <20 DETECTED copies/mL (A)  01/24/2023 30 Copies/mL (H)  01/16/2022 Not Detected Copies/mL   HIV-1 RNA Viral Load (no units)  Date Value  06/08/2013 <40  05/11/2013 <40  02/16/2013 <40   CD4 (no units)  Date Value  06/08/2013 480  05/11/2013 691  02/16/2013 576   CD4 T Cell Abs (/uL)  Date Value  01/16/2022 738  03/22/2021 736  09/20/2020 733   No results found for: HIV1GENOSEQ Lab Results  Component Value Date   WBC 8.1 09/02/2023   HGB 12.1 (L) 09/02/2023   HCT 35.8 (L) 09/02/2023   MCV 91.1 09/02/2023   PLT 291 09/02/2023    Lab Results  Component Value Date   CREATININE 1.01 09/02/2023   BUN 14 09/02/2023   NA 137 09/02/2023   K 3.8 09/02/2023   CL 104  09/02/2023   CO2 24 09/02/2023   Lab Results  Component Value Date   ALT 16 09/02/2023   AST 15 09/02/2023   ALKPHOS 96 09/02/2023   BILITOT 0.5 09/02/2023    Lab Results  Component Value Date   CHOL 152 01/24/2023   TRIG 97 01/24/2023   HDL 47 01/24/2023   LDLCALC 86 01/24/2023   Lab Results  Component Value Date   HAV NON REACTIVE 11/24/2014   Lab Results  Component Value Date   HEPBSAG NEG 10/18/2009   HEPBSAB POS (A) 11/24/2014   Lab Results  Component Value Date   HCVAB NEG 10/18/2009   Lab Results  Component Value Date   CHLAMYDIAWP Negative 01/16/2022   N Negative 01/16/2022   No results found for: GCPROBEAPT No results found for: QUANTGOLD  Health Maintenance: Immunization History  Administered Date(s) Administered   Hepatitis A 08/31/2008   Hepatitis A, Adult 10/24/2016   Hepatitis B 08/31/2008, 10/06/2008   Influenza Split 02/04/2011, 02/07/2012, 02/12/2016   Influenza Whole 03/10/2008   Influenza,inj,Quad PF,6+ Mos 02/02/2013, 01/24/2016, 01/21/2018, 04/05/2020, 04/04/2021, 02/12/2022   Meningococcal Mcv4o 11/21/2016, 09/30/2023  PFIZER Comirnaty(Gray Top)Covid-19 Tri-Sucrose Vaccine 11/03/2020   PFIZER(Purple Top)SARS-COV-2 Vaccination 08/19/2019, 09/11/2019, 10/01/2019, 04/07/2020   PNEUMOCOCCAL CONJUGATE-20 09/30/2023   Pfizer Covid-19 Vaccine Bivalent Booster 22yrs & up 04/04/2021   Pneumococcal Conjugate-13 01/21/2018   Pneumococcal Polysaccharide-23 10/06/2008, 10/19/2013   Td 10/06/2008    Assessment/Plan: # HIV - continue Biktarvy   - discussed labs  - Fu in 4 months per patient preference   # Morbid Obesity  # DM2 # HTN - discussed healthy diet containing more fruits and vegetables and regular exercise - on meds including statin - Following with PCP  # Immunization  - Menveo #2 and PCV 20 today   # Health maintenance - lipid panel today - Follows dentist - Colonoscopy in 10/2019 - diverticulolsis, one polyp removed    Patient's labs were reviewed as well as his previous records. Patients questions were addressed and answered. Safe sex counseling done.   I have personally spent 45 minutes involved in face-to-face and non-face-to-face activities for this patient on the day of the visit. Professional time spent includes the following activities: Preparing to see the patient (review of tests), Obtaining and/or reviewing separately obtained history (admission/discharge record), Performing a medically appropriate examination and/or evaluation , Ordering medications/tests/procedures, referring and communicating with other health care professionals, Documenting clinical information in the EMR, Independently interpreting results (not separately reported), Communicating results to the patient/family/caregiver, Counseling and educating the patient/family/caregiver and Care coordination (not separately reported).   Of note, portions of this note may have been created with voice recognition software. While this note has been edited for accuracy, occasional wrong-word or 'sound-a-like' substitutions may have occurred due to the inherent limitations of voice recognition software.   Electronically signed by:  Annalee Orem, MD Infectious Disease Physician [facility] [address] [place], [place] [address] Phone: [phone]  Fax: [phone]

## 2024-02-25 DIAGNOSIS — Z23 Encounter for immunization: Secondary | ICD-10-CM | POA: Insufficient documentation

## 2024-02-25 DIAGNOSIS — Z7185 Encounter for immunization safety counseling: Secondary | ICD-10-CM | POA: Insufficient documentation

## 2024-02-25 LAB — URINE CYTOLOGY ANCILLARY ONLY
Chlamydia: NEGATIVE
Comment: NEGATIVE
Comment: NORMAL
Neisseria Gonorrhea: NEGATIVE

## 2024-02-26 LAB — COMPREHENSIVE METABOLIC PANEL WITH GFR
AG Ratio: 1.2 (calc) (ref 1.0–2.5)
ALT: 12 U/L (ref 9–46)
AST: 14 U/L (ref 10–35)
Albumin: 4 g/dL (ref 3.6–5.1)
Alkaline phosphatase (APISO): 95 U/L (ref 35–144)
BUN: 13 mg/dL (ref 7–25)
CO2: 26 mmol/L (ref 20–32)
Calcium: 9.2 mg/dL (ref 8.6–10.3)
Chloride: 105 mmol/L (ref 98–110)
Creat: 1.21 mg/dL (ref 0.70–1.35)
Globulin: 3.3 g/dL (ref 1.9–3.7)
Glucose, Bld: 110 mg/dL — ABNORMAL HIGH (ref 65–99)
Potassium: 4.4 mmol/L (ref 3.5–5.3)
Sodium: 139 mmol/L (ref 135–146)
Total Bilirubin: 0.5 mg/dL (ref 0.2–1.2)
Total Protein: 7.3 g/dL (ref 6.1–8.1)
eGFR: 69 mL/min/1.73m2 (ref >=60)

## 2024-02-26 LAB — LIPID PANEL
Cholesterol: 123 mg/dL (ref <200)
HDL: 45 mg/dL (ref >=40)
LDL Cholesterol (Calc): 65 mg/dL
Non-HDL Cholesterol (Calc): 78 mg/dL (ref <130)
Total CHOL/HDL Ratio: 2.7 (calc) (ref <5.0)
Triglycerides: 51 mg/dL (ref <150)

## 2024-02-26 LAB — RPR: RPR Ser Ql: NONREACTIVE

## 2024-02-26 LAB — HIV RNA, RTPCR W/R GT (RTI, PI,INT)
HIV 1 RNA Quant: 25 {copies}/mL — ABNORMAL HIGH
HIV-1 RNA Quant, Log: 1.4 {Log_copies}/mL — ABNORMAL HIGH

## 2024-02-29 ENCOUNTER — Ambulatory Visit: Payer: Self-pay | Admitting: Infectious Diseases

## 2024-03-08 ENCOUNTER — Other Ambulatory Visit: Payer: Self-pay

## 2024-03-10 ENCOUNTER — Other Ambulatory Visit (HOSPITAL_COMMUNITY): Payer: Self-pay

## 2024-03-14 ENCOUNTER — Other Ambulatory Visit: Payer: Self-pay | Admitting: Cardiovascular Disease

## 2024-03-16 ENCOUNTER — Other Ambulatory Visit: Payer: Self-pay

## 2024-03-16 ENCOUNTER — Other Ambulatory Visit (HOSPITAL_COMMUNITY): Payer: Self-pay

## 2024-03-16 NOTE — Progress Notes (Signed)
 Specialty Pharmacy Refill Coordination Note  Vincent Young is a 58 y.o. male contacted today regarding refills of specialty medication(s) Bictegravir-Emtricitab-Tenofov (Biktarvy )   Patient requested Delivery   Delivery date: 03/18/24   Verified address: [address]  [place]   Medication will be filled on: 03/17/24

## 2024-03-25 ENCOUNTER — Other Ambulatory Visit: Payer: Self-pay

## 2024-03-26 ENCOUNTER — Encounter: Payer: Self-pay | Admitting: *Deleted

## 2024-03-29 ENCOUNTER — Ambulatory Visit: Admitting: Emergency Medicine

## 2024-03-29 NOTE — Progress Notes (Deleted)
  Cardiology Office Note:    Date:  03/29/2024  ID:  [name], DOB 08-26-1965, MRN [id number] PCP: Dayna Motto, DO  [facility] Providers Cardiologist:  Darryle ONEIDA Decent, MD { Click to update primary MD,subspecialty MD or APP then REFRESH:1}    {Click to Open Review  :1}   Patient Profile:       Chief Complaint: *** History of Present Illness:  Vincent Young is a 58 y.o. male with visit-pertinent history of HIV, T2DM, hypertension, obesity, nonobstructive CAD  Echocardiogram 02/02/2020 with LVEF 55 to 60%, no RWMA, mild LVH, normal diastolic parameters, RV function and size normal, trivial MR, borderline dilation of ascending aorta measuring 30 mm.  Coronary CTA 02/29/2020 showed coronary calcium  score 195 (94th percentile) with severe stenosis in proximal D1 and significant plaque in the distal LAD.  He underwent cardiac catheterization 03/15/2020 showing angiographically minimal disease with ramus lesion 55% stenosis, apical LAD tapers to roughly 50%, D1 has roughly 45% eccentric lesion.  Medical management was recommended.  He was last seen in clinic on 04/29/2022 by Dr. Decent.  His blood pressure was elevated at 170/98.  He reports sharp chest discomfort however symptoms do not sound like angina but symptoms primarily occurring in the morning with untreated severe sleep apnea.  His losartan  was increased to 100 mg daily and 10 mg of amlodipine  was added.  He was to return in 6 months.  Discussed the use of AI scribe software for clinical note transcription with the patient, who gave verbal consent to proceed.  History of Present Illness     Review of systems:  Please see the history of present illness. All other systems are reviewed and otherwise negative. ***      Studies Reviewed:        ***  Risk Assessment/Calculations:   {Does this patient have ATRIAL FIBRILLATION?[phone]} No BP recorded.  {Refresh Note OR Click here to enter BP  :1}***    STOP-Bang Score:     { Consider Dx Sleep Disordered Breathing or Sleep Apnea  ICD G47.33          :1}     Physical Exam:   VS:  There were no vitals taken for this visit.   Wt Readings from Last 3 Encounters:  02/24/24 (!) 360 lb (163.3 kg)  09/30/23 (!) 391 lb 9.6 oz (177.6 kg)  03/02/23 (!) 390 lb (176.9 kg)    GEN: Well nourished, well developed in no acute distress NECK: No JVD; No carotid bruits CARDIAC: ***RRR, no murmurs, rubs, gallops RESPIRATORY:  Clear to auscultation without rales, wheezing or rhonchi  ABDOMEN: Soft, non-tender, non-distended EXTREMITIES:  No edema; No acute deformity ***      Assessment and Plan:    Assessment and Plan Assessment & Plan      {Are you ordering a CV Procedure (e.g. stress test, cath, DCCV, TEE, etc)?   Press F2        :[id number]}  Dispo:  No follow-ups on file.  Signed, Lum LITTIE Louis, NP

## 2024-04-07 ENCOUNTER — Other Ambulatory Visit: Payer: Self-pay

## 2024-04-08 ENCOUNTER — Other Ambulatory Visit: Payer: Self-pay

## 2024-04-08 ENCOUNTER — Other Ambulatory Visit (HOSPITAL_COMMUNITY): Payer: Self-pay

## 2024-04-08 NOTE — Progress Notes (Signed)
 Specialty Pharmacy Refill Coordination Note  Vincent Young is a 58 y.o. male contacted today regarding refills of specialty medication(s) Bictegravir-Emtricitab-Tenofov (Biktarvy )   Patient requested (Patient-Rptd) Delivery   Delivery date: 04/09/24   Verified address: (Patient-Rptd) [address] [place] [address]   Medication will be filled on: 04/08/24

## 2024-04-19 ENCOUNTER — Other Ambulatory Visit: Payer: Self-pay | Admitting: Cardiovascular Disease

## 2024-04-21 DIAGNOSIS — R809 Proteinuria, unspecified: Secondary | ICD-10-CM | POA: Diagnosis not present

## 2024-04-21 DIAGNOSIS — Z21 Asymptomatic human immunodeficiency virus [HIV] infection status: Secondary | ICD-10-CM | POA: Diagnosis not present

## 2024-04-21 DIAGNOSIS — E1129 Type 2 diabetes mellitus with other diabetic kidney complication: Secondary | ICD-10-CM | POA: Diagnosis not present

## 2024-04-21 DIAGNOSIS — Z125 Encounter for screening for malignant neoplasm of prostate: Secondary | ICD-10-CM | POA: Diagnosis not present

## 2024-04-30 ENCOUNTER — Other Ambulatory Visit: Payer: Self-pay

## 2024-05-01 NOTE — Progress Notes (Deleted)
 " Cardiology Office Note:    Date:  05/01/2024   ID:  Vincent Young, DOB 04-16-1966, MRN [id number]  PCP:  Dayna Motto, DO   [facility] Providers Cardiologist:  Darryle ONEIDA Decent, MD { Click to update primary MD,subspecialty MD or APP then REFRESH:1}    Referring MD: Dayna Motto, DO   No chief complaint on file. ***  History of Present Illness:    Vincent Young is a 58 y.o. male with a hx of HIV, DM, severe OSA, hypertension, obesity, nonobstructive CAD.  He established with cardiology for chest pain.  He had an elevated coronary calcium  score and underwent left heart catheterization in 2021 with normal focusing on risk factor modification.  Echocardiogram demonstrated normal BiV function and no significant valvular disease.  There was borderline dilatation of the ascending aorta measuring 30 mm  His last visit was in 2023 and he was doing well at that time.  He presents today for routine follow-up.    CAD - Continue 81 mg aspirin , 10 mg Jardiance   Hypertension -- 100 mg losartan , 10 mg amlodipine     Hyperlipidemia with LDL less than 55 - Continue 80 mg Lipitor 02/24/2024: Cholesterol 123; HDL 45; LDL Cholesterol (Calc) 65; Triglycerides 51   OSA - AC using CPAP at night   Obesity - Working on weight loss        Past Medical History:  Diagnosis Date   Acute renal failure    due to dehydration   Arthritis    Asthma    Coronary artery disease    Diabetes mellitus without complication (HCC)    DJD (degenerative joint disease)    Exposure to TB    Family history of adverse reaction to anesthesia     My brother has had PONV   GERD (gastroesophageal reflux disease)    Headache    HIV infection (HCC)    Hypertension    Obesity    OSA (obstructive sleep apnea) 07/12/2020   Pilonidal cyst    Recurrent genital HSV (herpes simplex virus) infection    Sleep apnea    never tested-has s/s    Past Surgical History:  Procedure Laterality  Date   APPENDECTOMY     CHONDROPLASTY  12/16/2011   Procedure: CHONDROPLASTY;  Surgeon: Dempsey JINNY Sensor, MD;  Location: [facility];  Service: Orthopedics;  Laterality: Right;  Right knee medial and lateral menisectomy and Debridement 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,Trochlea    COLONOSCOPY W/ BIOPSIES AND POLYPECTOMY     COLONOSCOPY WITH PROPOFOL  N/A 08/29/2014   Procedure: COLONOSCOPY WITH PROPOFOL ;  Surgeon: Oliva Boots, MD;  Location: [facility] ENDOSCOPY;  Service: Endoscopy;  Laterality: N/A;   COLONOSCOPY WITH PROPOFOL  N/A 11/16/2019   Procedure: COLONOSCOPY WITH PROPOFOL ;  Surgeon: Boots Oliva, MD;  Location: [facility] ENDOSCOPY;  Service: Endoscopy;  Laterality: N/A;   HERNIA REPAIR     inguinal as a child   LEFT HEART CATH AND CORONARY ANGIOGRAPHY N/A 03/15/2020   Procedure: LEFT HEART CATH AND CORONARY ANGIOGRAPHY;  Surgeon: Anner Alm ORN, MD;  Location: [facility] INVASIVE CV LAB;  Service: Cardiovascular;  Laterality: N/A;   PEG TUBE PLACEMENT  2009   POLYPECTOMY  11/16/2019   Procedure: POLYPECTOMY;  Surgeon: Boots Oliva, MD;  Location: [facility];  Service: Endoscopy;;   TONSILLECTOMY     UVULECTOMY  2009    Current Medications: Active Medications[1]   Allergies:   Black walnut pollen allergy skin test, Shellfish allergy,  Other, and Penicillins   Social History   Socioeconomic History   Marital status: Legally Separated    Spouse name: Not on file   Number of children: 6   Years of education: Not on file   Highest education level: Not on file  Occupational History   Occupation: [employer]   Tobacco Use   Smoking status: Former    Current packs/day: 0.00    Types: Cigarettes    Start date: 12/03/1973    Quit date: 12/04/2003    Years since quitting: 20.4   Smokeless tobacco: Never   Tobacco comments:    pt. no longer smokes  Vaping Use   Vaping status: Never Used  Substance and Sexual Activity   Alcohol use: Not Currently     Comment: occasional   Drug use: Not Currently   Sexual activity: Yes    Partners: Female    Comment: pt. declined condoms  Other Topics Concern   Not on file  Social History Narrative   Not on file   Social Drivers of Health   Tobacco Use: Medium Risk (02/24/2024)   Patient History    Smoking Tobacco Use: Former    Smokeless Tobacco Use: Never    Passive Exposure: Not on Actuary Strain: Not on file  Food Insecurity: No Food Insecurity (02/18/2022)   Hunger Vital Sign    Worried About Running Out of Food in the Last Year: Never true    Ran Out of Food in the Last Year: Never true  Transportation Needs: No Transportation Needs (02/18/2022)   PRAPARE - Administrator, Civil Service (Medical): No    Lack of Transportation (Non-Medical): No  Physical Activity: Not on file  Stress: Not on file  Social Connections: Unknown (10/02/2021)   Received from [facility]   Social Network    Social Network: Not on file  Depression (PHQ2-9): Low Risk (09/30/2023)   Depression (PHQ2-9)    PHQ-2 Score: 4  Alcohol Screen: Not on file  Housing: Not on file  Utilities: Not on file  Health Literacy: Not on file     Family History: The patient's ***family history includes Arthritis in his mother and sister; Heart disease in his father; Hypertension in his brother, father, mother, and sister.  ROS:   Please see the history of present illness.    *** All other systems reviewed and are negative.  EKGs/Labs/Other Studies Reviewed:    The following studies were reviewed today: ***      Recent Labs: 09/02/2023: Hemoglobin 12.1; Platelets 291 02/24/2024: ALT 12; BUN 13; Creat 1.21; Potassium 4.4; Sodium 139  Recent Lipid Panel    Component Value Date/Time   CHOL 123 02/24/2024 1132   TRIG 51 02/24/2024 1132   HDL 45 02/24/2024 1132   CHOLHDL 2.7 02/24/2024 1132   VLDL 13 12/28/2015 1135   LDLCALC 65 02/24/2024 1132     Risk Assessment/Calculations:    {Does this patient have ATRIAL FIBRILLATION?[phone]}  No BP recorded.  {Refresh Note OR Click here to enter BP  :1}***    STOP-Bang Score:     { Consider Dx Sleep Disordered Breathing or Sleep Apnea  ICD G47.33          :1}     Physical Exam:    VS:  There were no vitals taken for this visit.    Wt Readings from Last 3 Encounters:  02/24/24 (!) 360 lb (163.3 kg)  09/30/23 (!) 391 lb 9.6 oz (  177.6 kg)  03/02/23 (!) 390 lb (176.9 kg)     GEN: *** Well nourished, well developed in no acute distress HEENT: Normal NECK: No JVD; No carotid bruits LYMPHATICS: No lymphadenopathy CARDIAC: ***RRR, no murmurs, rubs, gallops RESPIRATORY:  Clear to auscultation without rales, wheezing or rhonchi  ABDOMEN: Soft, non-tender, non-distended MUSCULOSKELETAL:  No edema; No deformity  SKIN: Warm and dry NEUROLOGIC:  Alert and oriented x 3 PSYCHIATRIC:  Normal affect   ASSESSMENT:    No diagnosis found. PLAN:    In order of problems listed above:  ***      {Are you ordering a CV Procedure (e.g. stress test, cath, DCCV, TEE, etc)?   Press F2        :[id number]}    Medication Adjustments/Labs and Tests Ordered: Current medicines are reviewed at length with the patient today.  Concerns regarding medicines are outlined above.  No orders of the defined types were placed in this encounter.  No orders of the defined types were placed in this encounter.   There are no Patient Instructions on file for this visit.   Signed, Jon Nat Hails, PA  05/01/2024 4:42 PM     [facility]     [1]  No outpatient medications have been marked as taking for the 05/03/24 encounter (Appointment) with Hails Jon Nat, PA.   "

## 2024-05-03 ENCOUNTER — Emergency Department (HOSPITAL_COMMUNITY)

## 2024-05-03 ENCOUNTER — Emergency Department (HOSPITAL_COMMUNITY)
Admission: EM | Admit: 2024-05-03 | Discharge: 2024-05-03 | Disposition: A | Discharge status: Home / Self Care | Attending: Emergency Medicine | Admitting: Emergency Medicine

## 2024-05-03 ENCOUNTER — Ambulatory Visit: Admitting: Physician Assistant

## 2024-05-03 ENCOUNTER — Other Ambulatory Visit: Payer: Self-pay

## 2024-05-03 DIAGNOSIS — J45909 Unspecified asthma, uncomplicated: Secondary | ICD-10-CM | POA: Diagnosis not present

## 2024-05-03 DIAGNOSIS — Z7984 Long term (current) use of oral hypoglycemic drugs: Secondary | ICD-10-CM | POA: Diagnosis not present

## 2024-05-03 DIAGNOSIS — Z21 Asymptomatic human immunodeficiency virus [HIV] infection status: Secondary | ICD-10-CM | POA: Diagnosis not present

## 2024-05-03 DIAGNOSIS — R1111 Vomiting without nausea: Secondary | ICD-10-CM | POA: Diagnosis not present

## 2024-05-03 DIAGNOSIS — I10 Essential (primary) hypertension: Secondary | ICD-10-CM | POA: Diagnosis not present

## 2024-05-03 DIAGNOSIS — Z7982 Long term (current) use of aspirin: Secondary | ICD-10-CM | POA: Diagnosis not present

## 2024-05-03 DIAGNOSIS — K5730 Diverticulosis of large intestine without perforation or abscess without bleeding: Secondary | ICD-10-CM | POA: Diagnosis not present

## 2024-05-03 DIAGNOSIS — N139 Obstructive and reflux uropathy, unspecified: Secondary | ICD-10-CM | POA: Diagnosis not present

## 2024-05-03 DIAGNOSIS — N200 Calculus of kidney: Secondary | ICD-10-CM | POA: Diagnosis not present

## 2024-05-03 DIAGNOSIS — R10A1 Flank pain, right side: Secondary | ICD-10-CM | POA: Diagnosis present

## 2024-05-03 DIAGNOSIS — E119 Type 2 diabetes mellitus without complications: Secondary | ICD-10-CM | POA: Diagnosis not present

## 2024-05-03 DIAGNOSIS — K4090 Unilateral inguinal hernia, without obstruction or gangrene, not specified as recurrent: Secondary | ICD-10-CM | POA: Diagnosis not present

## 2024-05-03 DIAGNOSIS — Z7951 Long term (current) use of inhaled steroids: Secondary | ICD-10-CM | POA: Diagnosis not present

## 2024-05-03 DIAGNOSIS — I2510 Atherosclerotic heart disease of native coronary artery without angina pectoris: Secondary | ICD-10-CM | POA: Diagnosis not present

## 2024-05-03 DIAGNOSIS — R0902 Hypoxemia: Secondary | ICD-10-CM | POA: Diagnosis not present

## 2024-05-03 LAB — CBC
HCT: 42.8 % (ref 39.0–52.0)
Hemoglobin: 13.9 g/dL (ref 13.0–17.0)
MCH: 31.5 pg (ref 26.0–34.0)
MCHC: 32.5 g/dL (ref 30.0–36.0)
MCV: 97.1 fL (ref 80.0–100.0)
Platelets: 286 K/uL (ref 150–400)
RBC: 4.41 MIL/uL (ref 4.22–5.81)
RDW: 14.2 % (ref 11.5–15.5)
WBC: 9.6 K/uL (ref 4.0–10.5)
nRBC: 0 % (ref 0.0–0.2)

## 2024-05-03 LAB — URINALYSIS, ROUTINE W REFLEX MICROSCOPIC
Bacteria, UA: NONE SEEN
Bilirubin Urine: NEGATIVE
Glucose, UA: >=500 mg/dL — AB
Ketones, ur: 5 mg/dL — AB
Leukocytes,Ua: NEGATIVE
Nitrite: NEGATIVE
Protein, ur: 30 mg/dL — AB
Specific Gravity, Urine: 1.03 (ref 1.005–1.030)
pH: 5 (ref 5.0–8.0)

## 2024-05-03 LAB — BASIC METABOLIC PANEL WITH GFR
Anion gap: 12 (ref 5–15)
BUN: 17 mg/dL (ref 6–20)
CO2: 22 mmol/L (ref 22–32)
Calcium: 9.3 mg/dL (ref 8.9–10.3)
Chloride: 105 mmol/L (ref 98–111)
Creatinine, Ser: 1.41 mg/dL — ABNORMAL HIGH (ref 0.61–1.24)
GFR, Estimated: 57 mL/min — ABNORMAL LOW (ref >60)
Glucose, Bld: 151 mg/dL — ABNORMAL HIGH (ref 70–99)
Potassium: 4.3 mmol/L (ref 3.5–5.1)
Sodium: 139 mmol/L (ref 135–145)

## 2024-05-03 MED ORDER — HYDROMORPHONE HCL 1 MG/ML IJ SOLN
1.0000 mg | Freq: Once | INTRAMUSCULAR | Status: AC
  Administered 2024-05-03: 08:00:00 1 mg via INTRAVENOUS
  Filled 2024-05-03: qty 1

## 2024-05-03 MED ORDER — OXYCODONE-ACETAMINOPHEN 5-325 MG PO TABS: 1.0000 | ORAL_TABLET | Freq: Four times a day (QID) | ORAL | 0 refills | Status: AC | PRN

## 2024-05-03 MED ORDER — ONDANSETRON HCL 4 MG/2ML IJ SOLN
4.0000 mg | Freq: Once | INTRAMUSCULAR | Status: AC
  Administered 2024-05-03: 08:00:00 4 mg via INTRAVENOUS
  Filled 2024-05-03: qty 2

## 2024-05-03 MED ORDER — HYDROMORPHONE HCL 1 MG/ML IJ SOLN
1.0000 mg | Freq: Once | INTRAMUSCULAR | Status: AC
  Administered 2024-05-03: 10:00:00 1 mg via INTRAVENOUS
  Filled 2024-05-03: qty 1

## 2024-05-03 MED ORDER — ONDANSETRON 4 MG PO TBDP: 4.0000 mg | ORAL_TABLET | Freq: Three times a day (TID) | ORAL | 0 refills | Status: AC | PRN

## 2024-05-03 NOTE — ED Provider Notes (Signed)
 [place] EMERGENCY DEPARTMENT AT [facility] Provider Note   CSN: [id number] Arrival date & time: 05/03/24  [id number]     Patient presents with: No chief complaint on file.   Vincent Young is a 58 y.o. male.   Patient is a 58 year old male with a history of CAD, diabetes, asthma, GERD and HIV on medication with undetectable viral loads who is presenting today with complaints of sudden onset of right sided pain, nausea and vomiting.  Patient reports that yesterday was a normal day he went to bed feeling fine and woke up at 2 AM with a severe pain in his right flank that radiated down into his right testicle.  The pain has been unrelenting since it started and has caused him to vomit multiple times.  He did notice his urine looked dark but denies any dysuria, frequency or urgency.  He has not had any fever.  He did increase his Mounjaro at the beginning of the week but has been tolerating that well.  Otherwise he has had no medication changes.  Does report having similar symptoms in the past and was told he had kidney stones but has never needed intervention.  EMS gave him 100 mcg of fentanyl  which he said has not helped with his pain.  The history is provided by the patient and medical records.       Prior to Admission medications  Medication Sig Start Date End Date Taking? Authorizing Provider  albuterol  (VENTOLIN  HFA) 108 (90 Base) MCG/ACT inhaler Inhale 1-2 puffs into the lungs every 6 (six) hours as needed for wheezing or shortness of breath. 03/02/23   Silver Wonda LABOR, PA  allopurinol (ZYLOPRIM) 300 MG tablet Take 300 mg by mouth.    [provider]  amLODipine  (NORVASC ) 10 MG tablet TAKE 1 TABLET BY MOUTH EVERY DAY 10/20/23   O'Neal, Darryle Ned, MD  aspirin  EC 81 MG tablet Take 1 tablet (81 mg total) by mouth daily. Swallow whole. 03/01/20   Barbaraann Darryle Ned, MD  atorvastatin  (LIPITOR) 80 MG tablet TAKE 1 TABLET BY MOUTH EVERY DAY 04/22/24   O'Neal, Darryle Ned, MD  bictegravir-emtricitabine -tenofovir  AF (BIKTARVY ) 50-200-25 MG TABS tablet Take 1 tablet by mouth daily. 09/30/23 09/29/24  Manandhar, Sabina, MD  budesonide-formoterol (SYMBICORT) 160-4.5 MCG/ACT inhaler 1 puff 2 (two) times daily. 04/07/23   [provider]  fluticasone (FLONASE) 50 MCG/ACT nasal spray Place 2 sprays into both nostrils daily as needed for allergies.  06/01/18   [provider]  hydrOXYzine (ATARAX) 25 MG tablet Take 25 mg by mouth daily as needed.    [provider]  JARDIANCE 10 MG TABS tablet 1 TABLET BY MOUTH ONCE DAILY, TAKEN IN THE MORNING, WITH OR WITHOUT FOOD    [provider]  losartan  (COZAAR ) 100 MG tablet Take 1 tablet (100 mg total) by mouth daily. 04/29/22   O'NealDarryle Ned, MD  LYRICA 25 MG capsule Take 1 capsule twice a day by oral route for 30 days. 08/13/23   [provider]  meloxicam (MOBIC) 15 MG tablet Take 15 mg by mouth daily. 10/26/19   [provider]  metFORMIN (GLUCOPHAGE) 500 MG tablet TAKE 1 TABLET BY MOUTH EVERY DAY WITH A MEAL FOR 90 DAYS for 90    [provider]  MOUNJARO 2.5 MG/0.5ML Pen SMARTSIG:2.5 Milligram(s) SUB-Q Once a Week    [provider]  nitroGLYCERIN  (NITROSTAT ) 0.4 MG SL tablet Place 1 tablet (0.4 mg total) under the tongue every  5 (five) minutes as needed. 04/17/22 02/24/24  O'NealDarryle Ned, MD  ondansetron  (ZOFRAN -ODT) 8 MG disintegrating tablet 8mg  ODT q4 hours prn nausea 09/03/23   Geroldine Berg, MD  Oxycodone  HCl 10 MG TABS Take 1 tablet twice a day by oral route as needed for 30 days. 02/09/24   [provider]  tiZANidine (ZANAFLEX) 2 MG tablet Take 2 mg by mouth at bedtime. 01/14/23   [provider]  valACYclovir  (VALTREX ) 500 MG tablet Take 2 tablets (1,000 mg total) by mouth daily. 04/08/23   Efrain Lamar ORN, MD    Allergies: Black walnut pollen allergy skin test, Shellfish allergy, Other, and Penicillins    Review  of Systems  Updated Vital Signs BP (!) 180/94   Pulse 80   Temp 97.6 F (36.4 C) (Oral)   Resp 20   SpO2 99%   Physical Exam Vitals and nursing note reviewed.  Constitutional:      General: He is not in acute distress.    Appearance: He is well-developed.     Comments: Appears to be in pain  HENT:     Head: Normocephalic and atraumatic.  Eyes:     Conjunctiva/sclera: Conjunctivae normal.     Pupils: Pupils are equal, round, and reactive to light.  Cardiovascular:     Rate and Rhythm: Normal rate and regular rhythm.     Heart sounds: No murmur heard. Pulmonary:     Effort: Pulmonary effort is normal. No respiratory distress.     Breath sounds: Normal breath sounds. No wheezing or rales.  Abdominal:     General: There is no distension.     Palpations: Abdomen is soft.     Tenderness: There is no abdominal tenderness. There is right CVA tenderness. There is no guarding or rebound.  Musculoskeletal:        General: No tenderness. Normal range of motion.     Cervical back: Normal range of motion and neck supple.  Skin:    General: Skin is warm and dry.     Findings: No erythema or rash.  Neurological:     Mental Status: He is alert and oriented to person, place, and time. Mental status is at baseline.  Psychiatric:        Behavior: Behavior normal.     (all labs ordered are listed, but only abnormal results are displayed) Labs Reviewed  URINALYSIS, ROUTINE W REFLEX MICROSCOPIC - Abnormal; Notable for the following components:      Result Value   Glucose, UA >=500 (*)    Hgb urine dipstick MODERATE (*)    Ketones, ur 5 (*)    Protein, ur 30 (*)    All other components within normal limits  BASIC METABOLIC PANEL WITH GFR - Abnormal; Notable for the following components:   Glucose, Bld 151 (*)    Creatinine, Ser 1.41 (*)    GFR, Estimated 57 (*)    All other components within normal limits  CBC    EKG: None  Radiology: CT Renal Stone Study Result Date:  05/03/2024 CLINICAL DATA:  Abdominal/flank pain, stone suspected. EXAM: CT ABDOMEN AND PELVIS WITHOUT CONTRAST TECHNIQUE: Multidetector CT imaging of the abdomen and pelvis was performed following the standard protocol without IV contrast. RADIATION DOSE REDUCTION: This exam was performed according to the departmental dose-optimization program which includes automated exposure control, adjustment of the mA and/or kV according to patient size and/or use of iterative reconstruction technique. COMPARISON:  CT scan renal stone protocol from 09/03/2023.  FINDINGS: Lower chest: The lung bases are clear. No pleural effusion. The heart is normal in size. No pericardial effusion. Hepatobiliary: The liver is normal in size. Non-cirrhotic configuration. No suspicious mass. No intrahepatic or extrahepatic bile duct dilation. No calcified gallstones. Normal gallbladder wall thickness. No pericholecystic inflammatory changes. Pancreas: Unremarkable. No pancreatic ductal dilatation or surrounding inflammatory changes. Spleen: Within normal limits. No focal lesion. Adrenals/Urinary Tract: Adrenal glands are unremarkable. No suspicious renal mass within the limitations of this unenhanced exam. There is a 2 x 3 mm right vesicoureteric junction calculus causing mild proximal obstructive uropathy. There are additional 2, 3 mm or smaller calculi in the right kidney. There is a single punctate nonobstructing calculus in the left kidney interpolar region. No left ureterolithiasis or obstructive uropathy. Urinary bladder is under distended, precluding optimal assessment. However, no large mass identified. No perivesical fat stranding. Stomach/Bowel: No disproportionate dilation of the small or large bowel loops. No evidence of abnormal bowel wall thickening or inflammatory changes. The appendix was not visualized; however there is no acute inflammatory process in the right lower quadrant. There are multiple diverticula mainly in the  sigmoid colon, without imaging signs of diverticulitis. Vascular/Lymphatic: No ascites or pneumoperitoneum. No abdominal or pelvic lymphadenopathy, by size criteria. No aneurysmal dilation of the major abdominal arteries. There are mild peripheral atherosclerotic vascular calcifications of the aorta and its major branches. Reproductive: Normal size prostate. Symmetric seminal vesicles. Other: There are small fat containing umbilical and left inguinal hernias. The soft tissues and abdominal wall are otherwise unremarkable. Musculoskeletal: No suspicious osseous lesions. There are moderate multilevel degenerative changes in the visualized spine. IMPRESSION: 1. There is a 2 x 3 mm right vesicoureteric junction calculus causing mild proximal obstructive uropathy. There are additional bilateral nonobstructing renal calculi, as described above. 2. Multiple other nonacute observations, as described above. Aortic Atherosclerosis (ICD10-I70.0). Electronically Signed   By: Ree Molt M.D.   On: 05/03/2024 08:31     Procedures   Medications Ordered in the ED  HYDROmorphone  (DILAUDID ) injection 1 mg (has no administration in time range)  HYDROmorphone  (DILAUDID ) injection 1 mg (1 mg Intravenous Given 05/03/24 0754)  ondansetron  (ZOFRAN ) injection 4 mg (4 mg Intravenous Given 05/03/24 0753)                                    Medical Decision Making Amount and/or Complexity of Data Reviewed Independent Historian: EMS External Data Reviewed: notes. Labs: ordered. Decision-making details documented in ED Course. Radiology: ordered and independent interpretation performed. Decision-making details documented in ED Course.  Risk Prescription drug management.   Pt with multiple medical problems and comorbidities and presenting today with a complaint that caries a high risk for morbidity and mortality.  Pt with symptoms consistent with kidney stone.  Denies infectious sx, or GI symptoms does have hx of HIV  but undetectable viral loads and no infectious sx.  Low concern for diverticulitis, dissection and AAA.  No hx suggestive of GU source (discharge).  Will  treat pain and ensure no infection with UA, CBC, BMP and will get stone study to further eval. Independently interpreted patient's labs and UA with moderate hemoglobin but no evidence of infection, BMP with mild AKI today with creatinine of 1.4 from his baseline of 1.2 and CBC within normal limits.  I have independently visualized and interpreted pt's images today.  CT today shows a right sided kidney stone.  Radiology  reports 2 x 3 mm right UVJ stone with mild obstructive uropathy with bilateral other stones.  On repeat evaluation patient reports the medication initially helped and now the pain is starting to come back.  Do not feel he is the best candidate for NSAIDs given his HIV history and current medication list as well as diabetes and history of CAD.  Not feel he needs emergent urologic workup. After repeat meds pain is controlled and feel that patient can be discharged home with oral pain medication.  He was given return precautions.      Final diagnoses:  Right kidney stone    ED Discharge Orders     None          Doretha Folks, MD 05/03/24 1507

## 2024-05-03 NOTE — Discharge Instructions (Addendum)
 You have a kidney stone today which should pass on its own hopefully within the next 24 to 48 hours.  Continue to drink plenty of fluid to help flush the kidneys.  You can use the pain medication as needed.  If you develop fever, recurrent pain with a lot of vomiting return to the emergency room.

## 2024-05-03 NOTE — ED Triage Notes (Signed)
 Pt arrives from home via EMS with complaints of RIGHT flank pain that radiates to the abdomen and groin. Pt endorses a hx of kidney stones.   20GA LEFT hand by EMS 100mcg Fentanyl  by EMS.

## 2024-05-06 ENCOUNTER — Other Ambulatory Visit: Payer: Self-pay

## 2024-05-06 NOTE — Progress Notes (Signed)
 Specialty Pharmacy Refill Coordination Note  ROME Bergdoll is a 58 y.o. male contacted today regarding refills of specialty medication(s) Bictegravir-Emtricitab-Tenofov (Biktarvy )   Patient requested (Patient-Rptd) Delivery   Delivery date: 05/10/24   Verified address: (Patient-Rptd) [address] [place] [address]   Medication will be filled on: 05/07/24

## 2024-05-07 ENCOUNTER — Other Ambulatory Visit: Payer: Self-pay

## 2024-05-14 ENCOUNTER — Other Ambulatory Visit: Payer: Self-pay | Admitting: Cardiovascular Disease

## 2024-06-02 ENCOUNTER — Other Ambulatory Visit: Payer: Self-pay

## 2024-06-02 ENCOUNTER — Other Ambulatory Visit (HOSPITAL_COMMUNITY): Payer: Self-pay

## 2024-06-03 ENCOUNTER — Other Ambulatory Visit: Payer: Self-pay

## 2024-06-03 ENCOUNTER — Other Ambulatory Visit (HOSPITAL_COMMUNITY): Payer: Self-pay

## 2024-06-03 ENCOUNTER — Telehealth: Payer: Self-pay

## 2024-06-03 NOTE — Telephone Encounter (Signed)
 [facility] Patient Advocate Encounter   I was successful in securing patient a $ 2100.00 grant from [employer] to provide copayment coverage for Biktarvy .  The patient's out of pocket cost will be 0.00 monthly.     I have spoken with the patient.    The billing information is as follows and has been shared with [facility].         Patient knows to call the office with questions or concerns.  Arland Hutchinson, CPhT Specialty Pharmacy Patient [facility] Phone: [phone] Fax:  [phone]

## 2024-06-04 ENCOUNTER — Emergency Department (HOSPITAL_BASED_OUTPATIENT_CLINIC_OR_DEPARTMENT_OTHER): Admitting: Radiology

## 2024-06-04 ENCOUNTER — Other Ambulatory Visit: Payer: Self-pay

## 2024-06-04 ENCOUNTER — Other Ambulatory Visit (HOSPITAL_COMMUNITY): Payer: Self-pay

## 2024-06-04 ENCOUNTER — Emergency Department (HOSPITAL_BASED_OUTPATIENT_CLINIC_OR_DEPARTMENT_OTHER)
Admission: EM | Admit: 2024-06-04 | Discharge: 2024-06-04 | Disposition: A | Discharge status: Home / Self Care | Attending: Emergency Medicine | Admitting: Emergency Medicine

## 2024-06-04 DIAGNOSIS — Z7982 Long term (current) use of aspirin: Secondary | ICD-10-CM | POA: Insufficient documentation

## 2024-06-04 DIAGNOSIS — Z21 Asymptomatic human immunodeficiency virus [HIV] infection status: Secondary | ICD-10-CM | POA: Diagnosis not present

## 2024-06-04 DIAGNOSIS — Z79899 Other long term (current) drug therapy: Secondary | ICD-10-CM | POA: Insufficient documentation

## 2024-06-04 DIAGNOSIS — Z7951 Long term (current) use of inhaled steroids: Secondary | ICD-10-CM | POA: Diagnosis not present

## 2024-06-04 DIAGNOSIS — I10 Essential (primary) hypertension: Secondary | ICD-10-CM | POA: Diagnosis not present

## 2024-06-04 DIAGNOSIS — R0602 Shortness of breath: Secondary | ICD-10-CM | POA: Diagnosis present

## 2024-06-04 DIAGNOSIS — J4521 Mild intermittent asthma with (acute) exacerbation: Secondary | ICD-10-CM | POA: Insufficient documentation

## 2024-06-04 LAB — BASIC METABOLIC PANEL WITH GFR
Anion gap: 10 (ref 5–15)
BUN: 16 mg/dL (ref 6–20)
CO2: 23 mmol/L (ref 22–32)
Calcium: 9.7 mg/dL (ref 8.9–10.3)
Chloride: 106 mmol/L (ref 98–111)
Creatinine, Ser: 1.2 mg/dL (ref 0.61–1.24)
GFR, Estimated: >60 mL/min
Glucose, Bld: 104 mg/dL — ABNORMAL HIGH (ref 70–99)
Potassium: 4.6 mmol/L (ref 3.5–5.1)
Sodium: 139 mmol/L (ref 135–145)

## 2024-06-04 LAB — PRO BRAIN NATRIURETIC PEPTIDE: Pro Brain Natriuretic Peptide: 176 pg/mL

## 2024-06-04 LAB — CBC
HCT: 38.2 % — ABNORMAL LOW (ref 39.0–52.0)
Hemoglobin: 13 g/dL (ref 13.0–17.0)
MCH: 32.1 pg (ref 26.0–34.0)
MCHC: 34 g/dL (ref 30.0–36.0)
MCV: 94.3 fL (ref 80.0–100.0)
Platelets: 255 K/uL (ref 150–400)
RBC: 4.05 MIL/uL — ABNORMAL LOW (ref 4.22–5.81)
RDW: 15 % (ref 11.5–15.5)
WBC: 8.6 K/uL (ref 4.0–10.5)
nRBC: 0 % (ref 0.0–0.2)

## 2024-06-04 LAB — RESP PANEL BY RT-PCR (RSV, FLU A&B, COVID)  RVPGX2
Influenza A by PCR: NEGATIVE
Influenza B by PCR: NEGATIVE
Resp Syncytial Virus by PCR: NEGATIVE
SARS Coronavirus 2 by RT PCR: NEGATIVE

## 2024-06-04 MED ORDER — BUDESONIDE-FORMOTEROL FUMARATE 160-4.5 MCG/ACT IN AERO: 1.0000 | INHALATION_SPRAY | Freq: Two times a day (BID) | RESPIRATORY_TRACT | 0 refills | Status: AC

## 2024-06-04 MED ORDER — IPRATROPIUM-ALBUTEROL 0.5-2.5 (3) MG/3ML IN SOLN
3.0000 mL | Freq: Once | RESPIRATORY_TRACT | Status: AC
  Administered 2024-06-04: 3 mL via RESPIRATORY_TRACT
  Filled 2024-06-04: qty 3

## 2024-06-04 MED ORDER — ALBUTEROL SULFATE HFA 108 (90 BASE) MCG/ACT IN AERS: 1.0000 | INHALATION_SPRAY | Freq: Four times a day (QID) | RESPIRATORY_TRACT | 0 refills | Status: AC | PRN

## 2024-06-04 NOTE — ED Notes (Addendum)
.  The patient is A&OX4, ambulatory at d/c with independent steady gait, but wheeled out of the ED via wheelchair. Pt verbalized understanding of d/c instructions, prescriptions and follow up care.

## 2024-06-04 NOTE — ED Triage Notes (Signed)
 Pt reports SOB with green phlegm for >1 week with cough, worse at night when laying flat.  Denies fever at home.  H/o asthma, has not had inhaler at home to use.

## 2024-06-04 NOTE — Discharge Instructions (Signed)
 It was a pleasure taking care of you today. You were seen in the Emergency Department for evaluation of shortness of breath. Your work-up was reassuring. Your CT/Xray/Labs showed no acute abnormality or evidence of infection that would explain your symptoms.  You did receive a breathing treatment while you are in the emergency department which you stated helped your breathing.  I do suspect that this was likely a mild asthma exacerbation.  As we discussed, I did refill both of your inhalers and have some them to your pharmacy.  Please follow the directions as written on the prescription.  You will need to follow-up with your PCP for a refill when you run out of the medicine. Refer to the attached documentation for further management of your symptoms.   Please return to the ER if you experience chest pain, trouble breathing, intractable nausea/vomiting or any other life threatening illnesses.

## 2024-06-04 NOTE — ED Provider Notes (Signed)
 " [place] EMERGENCY DEPARTMENT AT [facility] Provider Note   CSN: [id number] Arrival date & time: 06/04/24  1425     Patient presents with: Shortness of Breath   Vincent Young is a 59 y.o. male with past medical history of asthma, HIV, chronic bronchitis, hypertension, who presents emergency department for evaluation of shortness of breath.  Patient reports that he has been having a productive cough for the last 1 week.  He denies any recent sick contacts.  He denies any fever, chills or bodyaches.  Patient does report that he is out of his asthma inhaler.  He states that he becomes more short of breath when he lays flat.  Patient denies any chest pain, congestion, nausea or vomiting.    Shortness of Breath      Prior to Admission medications  Medication Sig Start Date End Date Taking? Authorizing Provider  albuterol  (VENTOLIN  HFA) 108 (90 Base) MCG/ACT inhaler Inhale 1-2 puffs into the lungs every 6 (six) hours as needed for wheezing or shortness of breath. 06/04/24   Kailiana Granquist, Marry RAMAN, PA-C  allopurinol (ZYLOPRIM) 300 MG tablet Take 300 mg by mouth.    [provider]  amLODipine  (NORVASC ) 10 MG tablet TAKE 1 TABLET BY MOUTH EVERY DAY 10/20/23   O'Neal, Darryle Ned, MD  aspirin  EC 81 MG tablet Take 1 tablet (81 mg total) by mouth daily. Swallow whole. 03/01/20   Barbaraann Darryle Ned, MD  atorvastatin  (LIPITOR) 80 MG tablet TAKE 1 TABLET BY MOUTH EVERY DAY 04/22/24   O'Neal, Darryle Ned, MD  bictegravir-emtricitabine -tenofovir  AF (BIKTARVY ) 50-200-25 MG TABS tablet Take 1 tablet by mouth daily. 09/30/23 09/29/24  Manandhar, Sabina, MD  budesonide -formoterol  (SYMBICORT ) 160-4.5 MCG/ACT inhaler Inhale 1 puff into the lungs 2 (two) times daily. 06/04/24   Jolon Degante, Marry RAMAN, PA-C  fluticasone (FLONASE) 50 MCG/ACT nasal spray Place 2 sprays into both nostrils daily as needed for allergies.  06/01/18   [provider]  hydrOXYzine (ATARAX) 25 MG tablet Take 25  mg by mouth daily as needed.    [provider]  JARDIANCE 10 MG TABS tablet 1 TABLET BY MOUTH ONCE DAILY, TAKEN IN THE MORNING, WITH OR WITHOUT FOOD    [provider]  losartan  (COZAAR ) 100 MG tablet Take 1 tablet (100 mg total) by mouth daily. 04/29/22   O'NealDarryle Ned, MD  LYRICA 25 MG capsule Take 1 capsule twice a day by oral route for 30 days. 08/13/23   [provider]  meloxicam (MOBIC) 15 MG tablet Take 15 mg by mouth daily. 10/26/19   [provider]  metFORMIN (GLUCOPHAGE) 500 MG tablet TAKE 1 TABLET BY MOUTH EVERY DAY WITH A MEAL FOR 90 DAYS for 90    [provider]  MOUNJARO 2.5 MG/0.5ML Pen SMARTSIG:2.5 Milligram(s) SUB-Q Once a Week    [provider]  nitroGLYCERIN  (NITROSTAT ) 0.4 MG SL tablet Place 1 tablet (0.4 mg total) under the tongue every 5 (five) minutes as needed. 04/17/22 02/24/24  O'NealDarryle Ned, MD  ondansetron  (ZOFRAN -ODT) 4 MG disintegrating tablet Take 1 tablet (4 mg total) by mouth every 8 (eight) hours as needed for nausea or vomiting. 05/03/24   Doretha Folks, MD  Oxycodone  HCl 10 MG TABS Take 1 tablet twice a day by oral route as needed for 30 days. 02/09/24   [provider]  oxyCODONE -acetaminophen  (PERCOCET/ROXICET) 5-325 MG tablet Take 1 tablet by mouth every 6 (six) hours as needed for severe pain (pain score 7-10). 05/03/24  Doretha Folks, MD  tiZANidine (ZANAFLEX) 2 MG tablet Take 2 mg by mouth at bedtime. 01/14/23   [provider]  valACYclovir  (VALTREX ) 500 MG tablet Take 2 tablets (1,000 mg total) by mouth daily. 04/08/23   Efrain Lamar ORN, MD    Allergies: Black walnut pollen allergy skin test, Shellfish allergy, Other, and Penicillins    Review of Systems  Respiratory:  Positive for shortness of breath.     Updated Vital Signs BP (!) 156/115   Pulse 68   Temp (!) 97.1 F (36.2 C)   Resp 18   SpO2 97%   Physical Exam Vitals and nursing note reviewed.   Constitutional:      Appearance: Normal appearance.  HENT:     Head: Normocephalic and atraumatic.     Mouth/Throat:     Mouth: Mucous membranes are moist.  Eyes:     General: No scleral icterus.       Right eye: No discharge.        Left eye: No discharge.     Conjunctiva/sclera: Conjunctivae normal.  Cardiovascular:     Rate and Rhythm: Normal rate and regular rhythm.     Pulses: Normal pulses.  Pulmonary:     Effort: Pulmonary effort is normal.     Breath sounds: Decreased breath sounds present.     Comments: Breath sounds are distant but clear. Abdominal:     General: There is no distension.     Tenderness: There is no abdominal tenderness.  Musculoskeletal:        General: No deformity.     Cervical back: Normal range of motion.  Skin:    General: Skin is warm and dry.     Capillary Refill: Capillary refill takes less than 2 seconds.  Neurological:     Mental Status: He is alert.     Motor: No weakness.  Psychiatric:        Mood and Affect: Mood normal.     (all labs ordered are listed, but only abnormal results are displayed) Labs Reviewed  BASIC METABOLIC PANEL WITH GFR - Abnormal; Notable for the following components:      Result Value   Glucose, Bld 104 (*)    All other components within normal limits  CBC - Abnormal; Notable for the following components:   RBC 4.05 (*)    HCT 38.2 (*)    All other components within normal limits  RESP PANEL BY RT-PCR (RSV, FLU A&B, COVID)  [id number]  PRO BRAIN NATRIURETIC PEPTIDE    EKG: EKG Interpretation Date/Time:  Friday June 04 2024 14:33:46 EST Ventricular Rate:  81 PR Interval:  158 QRS Duration:  88 QT Interval:  360 QTC Calculation: 418 R Axis:   41  Text Interpretation: Normal sinus rhythm Normal ECG No significant change since last tracing Confirmed by Emil Share [id number]) on 06/04/2024 3:50:28 PM  Radiology: DG Chest 2 View Result Date: 06/04/2024 EXAM: 2 VIEW(S) XRAY OF THE CHEST 06/04/2024 03:11:36  PM COMPARISON: Chest x ray 04/09/2023. CLINICAL HISTORY: SOB FINDINGS: LUNGS AND PLEURA: No focal pulmonary opacity. No pleural effusion. No pneumothorax. HEART AND MEDIASTINUM: No acute abnormality of the cardiac and mediastinal silhouettes. BONES AND SOFT TISSUES: No acute osseous abnormality. IMPRESSION: 1. No acute process. Electronically signed by: Greig Pique MD 06/04/2024 03:18 PM EST RP Workstation: [id number]   Procedures   Medications Ordered in the ED  ipratropium-albuterol  (DUONEB) 0.5-2.5 (3) MG/3ML nebulizer solution 3 mL (3 mLs Nebulization Given 06/04/24 1634)  Medical Decision Making Amount and/or Complexity of Data Reviewed Labs: ordered. Radiology: ordered.  Risk Prescription drug management.   This patient presents to the ED for concern of shortness of breath, this involves an extensive number of treatment options, and is a complaint that carries with it a high risk of complications and morbidity.   Differential diagnosis includes: Flu, COVID, RSV, pneumonia, pulmonary embolism, asthma exacerbation, new onset heart failure  Co morbidities:  hypertension   Lab Tests:  I Ordered, and personally interpreted labs.  The pertinent results include: No acute abnormalities to explain patient symptoms  Imaging Studies:  I ordered imaging studies including chest x-ray I independently visualized and interpreted imaging which showed no acute abnormality I agree with the radiologist interpretation  Cardiac Monitoring/ECG:  The patient was maintained on a cardiac monitor.  I personally viewed and interpreted the cardiac monitored which showed an underlying rhythm of: Normal sinus rhythm  Medicines ordered and prescription drug management:  I ordered medication including  Medications  ipratropium-albuterol  (DUONEB) 0.5-2.5 (3) MG/3ML nebulizer solution 3 mL (3 mLs Nebulization Given 06/04/24 1634)   for shortness of  breath Reevaluation of the patient after these medicines showed that the patient improved I have reviewed the patients home medicines and have made adjustments as needed  Test Considered:   none  Critical Interventions:   none  Consultations Obtained: None  Problem List / ED Course:     ICD-10-CM   1. Mild intermittent asthma with exacerbation  J45.21       MDM: 59 year old male who presents emergency department for evaluation of shortness of breath.  Lab work and chest x-ray are both unremarkable.  I did order a DuoNeb, as patient does have some distant lung sounds.  I also swabbed patient for flu and COVID given his recent productive cough and history of chronic bronchitis.  On reassessment, patient reports his work of breathing has improved.  I listen to his lung sounds, and they do sound less distant.  I suspect patient's symptoms are likely secondary to an asthma exacerbation, made worse by the fact that he has been out of his inhalers for a couple of months.  I went ahead and refilled both inhalers on his med list.  I did inform the patient that he will need to follow-up with his PCP for refills.  Patient verbalizes understanding to this.  Patient vital signs are stable.  Patient is appropriate for discharge at this time.    Dispostion:  After consideration of the diagnostic results and the patients response to treatment, I feel that the patient would benefit from supportive care.   Final diagnoses:  Mild intermittent asthma with exacerbation    ED Discharge Orders          Ordered    budesonide -formoterol  (SYMBICORT ) 160-4.5 MCG/ACT inhaler  2 times daily        06/04/24 1731    albuterol  (VENTOLIN  HFA) 108 (90 Base) MCG/ACT inhaler  Every 6 hours PRN        06/04/24 1731               Saleemah Mollenhauer, Marry RAMAN, PA-C 06/04/24 1742    Emil Share, DO 06/04/24 1746  "

## 2024-06-08 ENCOUNTER — Other Ambulatory Visit: Payer: Self-pay

## 2024-06-10 ENCOUNTER — Other Ambulatory Visit: Payer: Self-pay

## 2024-06-10 NOTE — Progress Notes (Signed)
 Specialty Pharmacy Refill Coordination Note  Vincent Young is a 59 y.o. male contacted today regarding refills of specialty medication(s) Bictegravir-Emtricitab-Tenofov (Biktarvy )   Patient requested Delivery   Delivery date: 06/17/24   Verified address: [address]  [place]   Medication will be filled on: 06/16/24

## 2024-06-10 NOTE — Progress Notes (Unsigned)
 "   Cardiology Clinic Note   Patient Name: Vincent Young Date of Encounter: 06/10/2024  Primary Care Provider:  Dayna Motto, DO Primary Cardiologist:  Darryle ONEIDA Decent, MD  Patient Profile    [name] 59 year old male presents to clinic today for follow-up evaluation of his coronary artery disease and hyperlipidemia.  Past Medical History    Past Medical History:  Diagnosis Date   Acute renal failure    due to dehydration   Arthritis    Asthma    Coronary artery disease    Diabetes mellitus without complication (HCC)    DJD (degenerative joint disease)    Exposure to TB    Family history of adverse reaction to anesthesia     My brother has had PONV   GERD (gastroesophageal reflux disease)    Headache    HIV infection (HCC)    Hypertension    Obesity    OSA (obstructive sleep apnea) 07/12/2020   Pilonidal cyst    Recurrent genital HSV (herpes simplex virus) infection    Sleep apnea    never tested-has s/s   Past Surgical History:  Procedure Laterality Date   APPENDECTOMY     CHONDROPLASTY  12/16/2011   Procedure: CHONDROPLASTY;  Surgeon: Dempsey JINNY Sensor, MD;  Location: [facility];  Service: Orthopedics;  Laterality: Right;  Right knee medial and lateral menisectomy and Debridement 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,Trochlea    COLONOSCOPY W/ BIOPSIES AND POLYPECTOMY     COLONOSCOPY WITH PROPOFOL  N/A 08/29/2014   Procedure: COLONOSCOPY WITH PROPOFOL ;  Surgeon: Oliva Boots, MD;  Location: [facility] ENDOSCOPY;  Service: Endoscopy;  Laterality: N/A;   COLONOSCOPY WITH PROPOFOL  N/A 11/16/2019   Procedure: COLONOSCOPY WITH PROPOFOL ;  Surgeon: Boots Oliva, MD;  Location: [facility] ENDOSCOPY;  Service: Endoscopy;  Laterality: N/A;   HERNIA REPAIR     inguinal as a child   LEFT HEART CATH AND CORONARY ANGIOGRAPHY N/A 03/15/2020   Procedure: LEFT HEART CATH AND CORONARY ANGIOGRAPHY;  Surgeon: Anner Alm ORN, MD;  Location: [facility]  INVASIVE CV LAB;  Service: Cardiovascular;  Laterality: N/A;   PEG TUBE PLACEMENT  2009   POLYPECTOMY  11/16/2019   Procedure: POLYPECTOMY;  Surgeon: Boots Oliva, MD;  Location: [facility] ENDOSCOPY;  Service: Endoscopy;;   TONSILLECTOMY     UVULECTOMY  2009    Allergies  Allergies[1]  History of Present Illness    [name] has a PMH of HIV, diabetes, hypertension, obesity, hyperlipidemia, and nonobstructive CAD.  He was seen in follow-up by Dr. Decent on 04/29/2022.  During that time he reported 4 weeks of episodes of sharp chest discomfort in the morning.  His symptoms would last for 30-40 minutes.  His symptoms were similar to his prior episodes.  He reported pain that could go to his shoulder.  He reports that he was able to massage the pain out.  His pain would improve through the day.  He had been diagnosed with severe sleep apnea however, he had not received his CPAP machine.  He reported snoring and fatigue.  He was falling asleep easily.  Plan was made to reach out to sleep medicine.  His blood pressure was noted to be 170/98.  He had been monitoring his blood pressure at home.  It was felt that his elevated blood pressure was related to his sleep apnea.  He was encouraged to continue to work on losing weight.  His BMI was noted to be 53.  His  LDL cholesterol was noted to be 53.  His EKG at that time showed normal rhythm with no acute ischemic changes.  He presents to the clinic today for follow-up evaluation.  He states***.  *** denies chest pain, shortness of breath, lower extremity edema, fatigue, palpitations, melena, hematuria, hemoptysis, diaphoresis, weakness, presyncope, syncope, orthopnea, and PND.  Coronary artery disease-no chest pain today.  Continues to note occasional intermittent episodes of sharp chest discomfort but denies exertional chest pain.  Previous cardiac catheterization 2021 showed nonobstructive plaque.   Order cardiac PET CT*** Heart healthy low-sodium  diet Increase physical activity as tolerated Continue weight loss  Hyperlipidemia-LDL*** High-fiber diet Continue atorvastatin , aspirin   Essential hypertension-BP today*** Maintain blood pressure log Continue losartan , amlodipine    OSA-reports compliance with CPAP.  Waking up well rested.*** Continue weight loss Sleep hygiene instructions Continue CPAP use  Obesity-weight today***. Reduced calorie diet Continue Pat Follows with PCP  Disposition: Follow-up with Dr. Barbaraann or me in 2-3 months.  Home Medications    Prior to Admission medications  Medication Sig Start Date End Date Taking? Authorizing Provider  albuterol  (VENTOLIN  HFA) 108 (90 Base) MCG/ACT inhaler Inhale 1-2 puffs into the lungs every 6 (six) hours as needed for wheezing or shortness of breath. 06/04/24   Dufour, Marry RAMAN, PA-C  allopurinol (ZYLOPRIM) 300 MG tablet Take 300 mg by mouth.    [provider]  amLODipine  (NORVASC ) 10 MG tablet TAKE 1 TABLET BY MOUTH EVERY DAY 10/20/23   O'Neal, Darryle Ned, MD  aspirin  EC 81 MG tablet Take 1 tablet (81 mg total) by mouth daily. Swallow whole. 03/01/20   Barbaraann Darryle Ned, MD  atorvastatin  (LIPITOR) 80 MG tablet TAKE 1 TABLET BY MOUTH EVERY DAY 04/22/24   O'Neal, Darryle Ned, MD  bictegravir-emtricitabine -tenofovir  AF (BIKTARVY ) 50-200-25 MG TABS tablet Take 1 tablet by mouth daily. 09/30/23 09/29/24  Manandhar, Sabina, MD  budesonide -formoterol  (SYMBICORT ) 160-4.5 MCG/ACT inhaler Inhale 1 puff into the lungs 2 (two) times daily. 06/04/24   Dufour, Marry RAMAN, PA-C  fluticasone (FLONASE) 50 MCG/ACT nasal spray Place 2 sprays into both nostrils daily as needed for allergies.  06/01/18   [provider]  hydrOXYzine (ATARAX) 25 MG tablet Take 25 mg by mouth daily as needed.    [provider]  JARDIANCE 10 MG TABS tablet 1 TABLET BY MOUTH ONCE DAILY, TAKEN IN THE MORNING, WITH OR WITHOUT FOOD    [provider]  losartan   (COZAAR ) 100 MG tablet Take 1 tablet (100 mg total) by mouth daily. 04/29/22   O'NealDarryle Ned, MD  LYRICA 25 MG capsule Take 1 capsule twice a day by oral route for 30 days. 08/13/23   [provider]  meloxicam (MOBIC) 15 MG tablet Take 15 mg by mouth daily. 10/26/19   [provider]  metFORMIN (GLUCOPHAGE) 500 MG tablet TAKE 1 TABLET BY MOUTH EVERY DAY WITH A MEAL FOR 90 DAYS for 90    [provider]  MOUNJARO 2.5 MG/0.5ML Pen SMARTSIG:2.5 Milligram(s) SUB-Q Once a Week    [provider]  nitroGLYCERIN  (NITROSTAT ) 0.4 MG SL tablet Place 1 tablet (0.4 mg total) under the tongue every 5 (five) minutes as needed. 04/17/22 02/24/24  O'NealDarryle Ned, MD  ondansetron  (ZOFRAN -ODT) 4 MG disintegrating tablet Take 1 tablet (4 mg total) by mouth every 8 (eight) hours as needed for nausea or vomiting. 05/03/24   Doretha Folks, MD  Oxycodone  HCl 10 MG TABS Take 1 tablet twice a day by oral route as  needed for 30 days. 02/09/24   [provider]  oxyCODONE -acetaminophen  (PERCOCET/ROXICET) 5-325 MG tablet Take 1 tablet by mouth every 6 (six) hours as needed for severe pain (pain score 7-10). 05/03/24   Doretha Folks, MD  tiZANidine (ZANAFLEX) 2 MG tablet Take 2 mg by mouth at bedtime. 01/14/23   [provider]  valACYclovir  (VALTREX ) 500 MG tablet Take 2 tablets (1,000 mg total) by mouth daily. 04/08/23   Comer, Lamar ORN, MD    Family History    Family History  Problem Relation Age of Onset   Heart disease Father    Hypertension Father    Hypertension Mother    Arthritis Mother    Hypertension Sister    Arthritis Sister    Hypertension Brother    He indicated that his mother is alive. He indicated that his father is deceased. He indicated that his sister is alive. He indicated that his brother is alive.  Social History    Social History   Socioeconomic History   Marital status: Legally Separated    Spouse name: Not on file    Number of children: 6   Years of education: Not on file   Highest education level: Not on file  Occupational History   Occupation: [employer]   Tobacco Use   Smoking status: Former    Current packs/day: 0.00    Types: Cigarettes    Start date: 12/03/1973    Quit date: 12/04/2003    Years since quitting: 20.5   Smokeless tobacco: Never   Tobacco comments:    pt. no longer smokes  Vaping Use   Vaping status: Never Used  Substance and Sexual Activity   Alcohol use: Not Currently    Comment: occasional   Drug use: Not Currently   Sexual activity: Yes    Partners: Female    Comment: pt. declined condoms  Other Topics Concern   Not on file  Social History Narrative   Not on file   Social Drivers of Health   Tobacco Use: Medium Risk (02/24/2024)   Patient History    Smoking Tobacco Use: Former    Smokeless Tobacco Use: Never    Passive Exposure: Not on Actuary Strain: Not on file  Food Insecurity: No Food Insecurity (02/18/2022)   Hunger Vital Sign    Worried About Running Out of Food in the Last Year: Never true    Ran Out of Food in the Last Year: Never true  Transportation Needs: No Transportation Needs (02/18/2022)   PRAPARE - Administrator, Civil Service (Medical): No    Lack of Transportation (Non-Medical): No  Physical Activity: Not on file  Stress: Not on file  Social Connections: Unknown (10/02/2021)   [facility]   Social Network    Social Network: Not on file  Intimate Partner Violence: Unknown (08/22/2021)   Received from [facility]   HITS    Physically Hurt: Not on file    Insult or Talk Down To: Not on file    Threaten Physical Harm: Not on file    Scream or Curse: Not on file  Depression (PHQ2-9): Low Risk (09/30/2023)   Depression (PHQ2-9)    PHQ-2 Score: 4  Alcohol Screen: Not on file  Housing: Not on file  Utilities: Not on file  Health Literacy: Not on file     Review of Systems    General:  No  chills, fever, night sweats or weight changes.  Cardiovascular:  No chest pain, dyspnea on exertion, edema, orthopnea, palpitations, paroxysmal nocturnal dyspnea. Dermatological: No rash, lesions/masses Respiratory: No cough, dyspnea Urologic: No hematuria, dysuria Abdominal:   No nausea, vomiting, diarrhea, bright red blood per rectum, melena, or hematemesis Neurologic:  No visual changes, wkns, changes in mental status. All other systems reviewed and are otherwise negative except as noted above.  Physical Exam    VS:  There were no vitals taken for this visit. , BMI There is no height or weight on file to calculate BMI. GEN: Well nourished, well developed, in no acute distress. HEENT: normal. Neck: Supple, no JVD, carotid bruits, or masses. Cardiac: RRR, no murmurs, rubs, or gallops. No clubbing, cyanosis, edema.  Radials/DP/PT 2+ and equal bilaterally.  Respiratory:  Respirations regular and unlabored, clear to auscultation bilaterally. GI: Soft, nontender, nondistended, BS + x 4. MS: no deformity or atrophy. Skin: warm and dry, no rash. Neuro:  Strength and sensation are intact. Psych: Normal affect.  Accessory Clinical Findings    Recent Labs: 02/24/2024: ALT 12 06/04/2024: BUN 16; Creatinine, Ser 1.20; Hemoglobin 13.0; Platelets 255; Potassium 4.6; Pro Brain Natriuretic Peptide 176.0; Sodium 139   Recent Lipid Panel    Component Value Date/Time   CHOL 123 02/24/2024 1132   TRIG 51 02/24/2024 1132   HDL 45 02/24/2024 1132   CHOLHDL 2.7 02/24/2024 1132   VLDL 13 12/28/2015 1135   LDLCALC 65 02/24/2024 1132    No BP recorded.  {Refresh Note OR Click here to enter BP  :1}***    ECG personally reviewed by me today- ***    Cardiac catheterization 03/15/2020  LV end diastolic pressure is mildly elevated. --------------------- 1st Diag lesion is 45% stenosed. Dist-Apical LAD lesion is 50% stenosed. Ramus lesion is 55% stenosed.   SUMMARY Angiographically minimal  disease, apical LAD tapers to roughly 50%, D1 has roughly 45% eccentric lesion. Normal LVEDP     RECOMMENDATIONS Evaluate for noncardiac etiology for chest pain.       Alm Clay, MD    Diagnostic Dominance: Co-dominant  Intervention    Assessment & Plan   1.  ***   Josefa HERO. Amoura Ransier NP-C     06/10/2024, 8:18 AM [facility] [address] [place], [place] [address] Office [phone]    Notice: This dictation was prepared with Dragon dictation along with smaller phrase technology. Any transcriptional errors that result from this process are unintentional and may not be corrected upon review.   I spent***minutes examining this patient, reviewing medications, and using patient centered shared decision making involving their cardiac care.   I spent  20 minutes reviewing past medical history,  medications, and prior cardiac tests.     [1]  Allergies Allergen Reactions   Black Walnut Pollen Allergy Skin Test Anaphylaxis   Shellfish Allergy Anaphylaxis   Other Other (See Comments)    Tree nuts cause throat swelling Grass pollen causes itchy, sneezing   Penicillins Rash    10 years ago   "

## 2024-06-11 ENCOUNTER — Other Ambulatory Visit: Payer: Self-pay

## 2024-06-11 NOTE — Progress Notes (Signed)
 Clinical Intervention Note  Clinical Intervention Notes: Patient reported taking a course of Clindamycin for tooth infection. No DDIs identified with Biktarvy .   Clinical Intervention Outcomes: Prevention of an adverse drug event   Advertising Account Planner

## 2024-06-14 ENCOUNTER — Ambulatory Visit: Admitting: General Practice

## 2024-06-16 ENCOUNTER — Other Ambulatory Visit: Payer: Self-pay

## 2024-06-22 ENCOUNTER — Other Ambulatory Visit: Payer: Self-pay

## 2024-06-23 ENCOUNTER — Other Ambulatory Visit (HOSPITAL_COMMUNITY): Payer: Self-pay

## 2024-06-24 NOTE — Progress Notes (Signed)
 Abubakar Crispo                                          MRN: [id number]   06/24/2024   The [facility] Quality Team Specialist reviewed this patient medical record for the purposes of chart review for care gap closure. The following were reviewed: chart review for care gap closure-diabetic eye exam.    [facility] Quality Team

## 2024-07-06 ENCOUNTER — Ambulatory Visit: Admitting: Infectious Diseases

## 2024-07-07 ENCOUNTER — Ambulatory Visit: Admitting: General Practice
